# Patient Record
Sex: Female | Born: 1950 | Race: White | Hispanic: No
Health system: Western US, Academic
[De-identification: ages and names within clinical notes are randomized; demographics above are authoritative.]

## PROBLEM LIST (undated history)

## (undated) DIAGNOSIS — M797 Fibromyalgia: Secondary | ICD-10-CM

## (undated) DIAGNOSIS — I10 Essential (primary) hypertension: Secondary | ICD-10-CM

## (undated) DIAGNOSIS — E785 Hyperlipidemia, unspecified: Secondary | ICD-10-CM

## (undated) DIAGNOSIS — K219 Gastro-esophageal reflux disease without esophagitis: Secondary | ICD-10-CM

## (undated) DIAGNOSIS — M810 Age-related osteoporosis without current pathological fracture: Secondary | ICD-10-CM

## (undated) DIAGNOSIS — Z8673 Personal history of transient ischemic attack (TIA), and cerebral infarction without residual deficits: Secondary | ICD-10-CM

## (undated) DIAGNOSIS — I639 Cerebral infarction, unspecified: Secondary | ICD-10-CM

## (undated) DIAGNOSIS — S32050A Wedge compression fracture of fifth lumbar vertebra, initial encounter for closed fracture: Secondary | ICD-10-CM

## (undated) DIAGNOSIS — G4730 Sleep apnea, unspecified: Secondary | ICD-10-CM

## (undated) HISTORY — DX: Cerebral infarction, unspecified: I63.9

## (undated) HISTORY — PX: BACK SURGERY: SHX140

## (undated) HISTORY — DX: Essential (primary) hypertension: I10

## (undated) HISTORY — PX: EYE SURGERY: SHX253

## (undated) HISTORY — PX: BUNIONECTOMY: SHX129

---

## 2007-05-13 DIAGNOSIS — G4730 Sleep apnea, unspecified: Secondary | ICD-10-CM

## 2007-05-13 HISTORY — DX: Sleep apnea, unspecified: G47.30

## 2008-05-12 HISTORY — PX: CATARACT EXTRACTION W/ INTRAOCULAR LENS  IMPLANT, BILATERAL: SHX1307

## 2008-05-12 HISTORY — PX: OTHER SURGICAL HISTORY: SHX169

## 2009-05-04 LAB — HM COLONOSCOPY

## 2010-01-06 ENCOUNTER — Other Ambulatory Visit: Payer: Self-pay

## 2010-03-26 ENCOUNTER — Other Ambulatory Visit: Payer: Self-pay

## 2010-04-09 ENCOUNTER — Ambulatory Visit (HOSPITAL_BASED_OUTPATIENT_CLINIC_OR_DEPARTMENT_OTHER): Payer: No Typology Code available for payment source | Attending: Neurology | Admitting: Neurology

## 2010-04-09 DIAGNOSIS — I6350 Cerebral infarction due to unspecified occlusion or stenosis of unspecified cerebral artery: Secondary | ICD-10-CM | POA: Insufficient documentation

## 2010-04-09 DIAGNOSIS — H9190 Unspecified hearing loss, unspecified ear: Secondary | ICD-10-CM | POA: Insufficient documentation

## 2010-04-09 DIAGNOSIS — I10 Essential (primary) hypertension: Secondary | ICD-10-CM | POA: Insufficient documentation

## 2010-04-16 ENCOUNTER — Ambulatory Visit (HOSPITAL_BASED_OUTPATIENT_CLINIC_OR_DEPARTMENT_OTHER): Payer: No Typology Code available for payment source | Attending: Neurology

## 2010-04-16 DIAGNOSIS — R42 Dizziness and giddiness: Secondary | ICD-10-CM | POA: Insufficient documentation

## 2010-04-16 DIAGNOSIS — I629 Nontraumatic intracranial hemorrhage, unspecified: Secondary | ICD-10-CM | POA: Insufficient documentation

## 2010-05-03 ENCOUNTER — Ambulatory Visit (HOSPITAL_BASED_OUTPATIENT_CLINIC_OR_DEPARTMENT_OTHER): Payer: No Typology Code available for payment source | Attending: Neurology | Admitting: Physical Therapist

## 2010-05-03 DIAGNOSIS — R269 Unspecified abnormalities of gait and mobility: Secondary | ICD-10-CM | POA: Insufficient documentation

## 2010-05-03 DIAGNOSIS — IMO0001 Reserved for inherently not codable concepts without codable children: Secondary | ICD-10-CM | POA: Insufficient documentation

## 2010-05-09 ENCOUNTER — Ambulatory Visit (HOSPITAL_BASED_OUTPATIENT_CLINIC_OR_DEPARTMENT_OTHER): Payer: No Typology Code available for payment source | Admitting: Physical Therapist

## 2010-05-11 ENCOUNTER — Other Ambulatory Visit: Payer: Self-pay

## 2010-05-13 ENCOUNTER — Other Ambulatory Visit: Payer: Self-pay

## 2010-05-14 ENCOUNTER — Other Ambulatory Visit: Payer: Self-pay

## 2010-05-14 ENCOUNTER — Encounter (HOSPITAL_BASED_OUTPATIENT_CLINIC_OR_DEPARTMENT_OTHER): Payer: No Typology Code available for payment source | Admitting: Physical Therapist

## 2010-05-15 ENCOUNTER — Other Ambulatory Visit: Payer: Self-pay

## 2010-05-16 ENCOUNTER — Other Ambulatory Visit: Payer: Self-pay

## 2010-05-17 ENCOUNTER — Other Ambulatory Visit: Payer: Self-pay

## 2010-05-18 ENCOUNTER — Other Ambulatory Visit: Payer: Self-pay

## 2010-05-19 ENCOUNTER — Other Ambulatory Visit: Payer: Self-pay

## 2010-05-20 ENCOUNTER — Other Ambulatory Visit: Payer: Self-pay

## 2010-05-20 ENCOUNTER — Ambulatory Visit (HOSPITAL_BASED_OUTPATIENT_CLINIC_OR_DEPARTMENT_OTHER): Payer: No Typology Code available for payment source | Attending: Neurology | Admitting: Physical Therapist

## 2010-05-20 DIAGNOSIS — IMO0001 Reserved for inherently not codable concepts without codable children: Secondary | ICD-10-CM | POA: Insufficient documentation

## 2010-05-20 DIAGNOSIS — R269 Unspecified abnormalities of gait and mobility: Secondary | ICD-10-CM | POA: Insufficient documentation

## 2010-05-21 ENCOUNTER — Other Ambulatory Visit: Payer: Self-pay

## 2010-05-22 ENCOUNTER — Ambulatory Visit (HOSPITAL_BASED_OUTPATIENT_CLINIC_OR_DEPARTMENT_OTHER): Payer: No Typology Code available for payment source | Admitting: Physical Therapist

## 2010-05-22 ENCOUNTER — Other Ambulatory Visit: Payer: Self-pay

## 2010-05-23 ENCOUNTER — Other Ambulatory Visit: Payer: Self-pay

## 2010-05-24 ENCOUNTER — Other Ambulatory Visit: Payer: Self-pay

## 2010-05-26 ENCOUNTER — Other Ambulatory Visit: Payer: Self-pay

## 2010-05-27 ENCOUNTER — Other Ambulatory Visit: Payer: Self-pay

## 2010-05-29 ENCOUNTER — Encounter (HOSPITAL_BASED_OUTPATIENT_CLINIC_OR_DEPARTMENT_OTHER): Payer: No Typology Code available for payment source | Admitting: Physical Therapist

## 2010-05-29 ENCOUNTER — Other Ambulatory Visit: Payer: Self-pay

## 2010-05-30 ENCOUNTER — Other Ambulatory Visit: Payer: Self-pay

## 2010-05-31 ENCOUNTER — Encounter (HOSPITAL_BASED_OUTPATIENT_CLINIC_OR_DEPARTMENT_OTHER): Payer: No Typology Code available for payment source | Admitting: Physical Therapist

## 2010-05-31 ENCOUNTER — Other Ambulatory Visit: Payer: Self-pay

## 2010-06-01 ENCOUNTER — Other Ambulatory Visit: Payer: Self-pay

## 2010-06-04 ENCOUNTER — Ambulatory Visit (HOSPITAL_BASED_OUTPATIENT_CLINIC_OR_DEPARTMENT_OTHER): Payer: No Typology Code available for payment source | Admitting: Physical Therapist

## 2010-06-06 ENCOUNTER — Ambulatory Visit (HOSPITAL_BASED_OUTPATIENT_CLINIC_OR_DEPARTMENT_OTHER): Payer: No Typology Code available for payment source | Admitting: Physical Therapist

## 2010-06-11 ENCOUNTER — Ambulatory Visit (HOSPITAL_BASED_OUTPATIENT_CLINIC_OR_DEPARTMENT_OTHER): Payer: No Typology Code available for payment source | Admitting: Physical Therapist

## 2010-06-13 ENCOUNTER — Ambulatory Visit (HOSPITAL_BASED_OUTPATIENT_CLINIC_OR_DEPARTMENT_OTHER): Payer: No Typology Code available for payment source | Attending: Neurology | Admitting: Physical Therapist

## 2010-06-13 DIAGNOSIS — R269 Unspecified abnormalities of gait and mobility: Secondary | ICD-10-CM | POA: Insufficient documentation

## 2010-06-13 DIAGNOSIS — IMO0001 Reserved for inherently not codable concepts without codable children: Secondary | ICD-10-CM | POA: Insufficient documentation

## 2010-06-17 ENCOUNTER — Ambulatory Visit (HOSPITAL_BASED_OUTPATIENT_CLINIC_OR_DEPARTMENT_OTHER): Payer: No Typology Code available for payment source | Admitting: Physical Therapist

## 2010-06-19 ENCOUNTER — Encounter (HOSPITAL_BASED_OUTPATIENT_CLINIC_OR_DEPARTMENT_OTHER): Payer: No Typology Code available for payment source | Admitting: Physical Therapist

## 2010-06-20 ENCOUNTER — Encounter (HOSPITAL_BASED_OUTPATIENT_CLINIC_OR_DEPARTMENT_OTHER): Payer: No Typology Code available for payment source | Admitting: Physical Therapist

## 2010-06-24 ENCOUNTER — Ambulatory Visit (HOSPITAL_BASED_OUTPATIENT_CLINIC_OR_DEPARTMENT_OTHER): Payer: No Typology Code available for payment source | Admitting: Physical Therapist

## 2010-06-25 ENCOUNTER — Ambulatory Visit: Payer: No Typology Code available for payment source | Attending: Neurology | Admitting: Neurology

## 2010-06-25 DIAGNOSIS — I6350 Cerebral infarction due to unspecified occlusion or stenosis of unspecified cerebral artery: Secondary | ICD-10-CM | POA: Insufficient documentation

## 2010-06-26 ENCOUNTER — Ambulatory Visit (HOSPITAL_BASED_OUTPATIENT_CLINIC_OR_DEPARTMENT_OTHER): Payer: No Typology Code available for payment source | Admitting: Physical Therapist

## 2010-07-02 ENCOUNTER — Ambulatory Visit (HOSPITAL_BASED_OUTPATIENT_CLINIC_OR_DEPARTMENT_OTHER): Payer: No Typology Code available for payment source | Admitting: Physical Therapist

## 2010-07-02 ENCOUNTER — Ambulatory Visit (HOSPITAL_BASED_OUTPATIENT_CLINIC_OR_DEPARTMENT_OTHER): Payer: No Typology Code available for payment source | Attending: Neurology | Admitting: Neurology

## 2010-07-02 DIAGNOSIS — I10 Essential (primary) hypertension: Secondary | ICD-10-CM | POA: Insufficient documentation

## 2010-07-02 DIAGNOSIS — I619 Nontraumatic intracerebral hemorrhage, unspecified: Secondary | ICD-10-CM | POA: Insufficient documentation

## 2010-07-04 ENCOUNTER — Ambulatory Visit (HOSPITAL_BASED_OUTPATIENT_CLINIC_OR_DEPARTMENT_OTHER): Payer: No Typology Code available for payment source | Attending: Neurology | Admitting: Physical Therapist

## 2010-07-04 DIAGNOSIS — IMO0001 Reserved for inherently not codable concepts without codable children: Secondary | ICD-10-CM | POA: Insufficient documentation

## 2010-07-04 DIAGNOSIS — R269 Unspecified abnormalities of gait and mobility: Secondary | ICD-10-CM | POA: Insufficient documentation

## 2010-07-09 ENCOUNTER — Ambulatory Visit (HOSPITAL_BASED_OUTPATIENT_CLINIC_OR_DEPARTMENT_OTHER): Payer: No Typology Code available for payment source | Admitting: Physical Therapist

## 2010-07-09 ENCOUNTER — Encounter (HOSPITAL_BASED_OUTPATIENT_CLINIC_OR_DEPARTMENT_OTHER): Payer: No Typology Code available for payment source | Admitting: Neurology

## 2010-07-11 ENCOUNTER — Encounter (HOSPITAL_BASED_OUTPATIENT_CLINIC_OR_DEPARTMENT_OTHER): Payer: No Typology Code available for payment source | Admitting: Physical Therapist

## 2010-07-16 ENCOUNTER — Ambulatory Visit (HOSPITAL_BASED_OUTPATIENT_CLINIC_OR_DEPARTMENT_OTHER): Payer: No Typology Code available for payment source | Attending: Neurology | Admitting: Physical Therapist

## 2010-07-16 DIAGNOSIS — IMO0001 Reserved for inherently not codable concepts without codable children: Secondary | ICD-10-CM | POA: Insufficient documentation

## 2010-07-16 DIAGNOSIS — R269 Unspecified abnormalities of gait and mobility: Secondary | ICD-10-CM | POA: Insufficient documentation

## 2010-07-25 ENCOUNTER — Ambulatory Visit (HOSPITAL_BASED_OUTPATIENT_CLINIC_OR_DEPARTMENT_OTHER): Payer: No Typology Code available for payment source | Admitting: Physical Therapist

## 2010-07-29 ENCOUNTER — Encounter (HOSPITAL_BASED_OUTPATIENT_CLINIC_OR_DEPARTMENT_OTHER): Payer: No Typology Code available for payment source | Admitting: Physical Therapist

## 2010-07-30 ENCOUNTER — Encounter (HOSPITAL_BASED_OUTPATIENT_CLINIC_OR_DEPARTMENT_OTHER): Payer: No Typology Code available for payment source | Admitting: Neurology

## 2010-07-31 ENCOUNTER — Ambulatory Visit (HOSPITAL_BASED_OUTPATIENT_CLINIC_OR_DEPARTMENT_OTHER): Payer: No Typology Code available for payment source | Attending: Neurology | Admitting: Physical Therapist

## 2010-07-31 DIAGNOSIS — R269 Unspecified abnormalities of gait and mobility: Secondary | ICD-10-CM | POA: Insufficient documentation

## 2010-07-31 DIAGNOSIS — IMO0001 Reserved for inherently not codable concepts without codable children: Secondary | ICD-10-CM | POA: Insufficient documentation

## 2010-08-05 ENCOUNTER — Encounter (HOSPITAL_BASED_OUTPATIENT_CLINIC_OR_DEPARTMENT_OTHER): Payer: No Typology Code available for payment source | Admitting: Physical Therapist

## 2010-08-07 ENCOUNTER — Ambulatory Visit (HOSPITAL_BASED_OUTPATIENT_CLINIC_OR_DEPARTMENT_OTHER): Payer: No Typology Code available for payment source | Admitting: Physical Therapist

## 2010-08-13 ENCOUNTER — Ambulatory Visit (HOSPITAL_BASED_OUTPATIENT_CLINIC_OR_DEPARTMENT_OTHER): Payer: No Typology Code available for payment source | Attending: Neurology | Admitting: Physical Therapist

## 2010-08-13 DIAGNOSIS — IMO0001 Reserved for inherently not codable concepts without codable children: Secondary | ICD-10-CM | POA: Insufficient documentation

## 2010-08-13 DIAGNOSIS — R269 Unspecified abnormalities of gait and mobility: Secondary | ICD-10-CM | POA: Insufficient documentation

## 2010-08-20 ENCOUNTER — Ambulatory Visit (HOSPITAL_BASED_OUTPATIENT_CLINIC_OR_DEPARTMENT_OTHER): Payer: No Typology Code available for payment source | Admitting: Physical Therapist

## 2010-09-03 ENCOUNTER — Ambulatory Visit (HOSPITAL_BASED_OUTPATIENT_CLINIC_OR_DEPARTMENT_OTHER): Payer: No Typology Code available for payment source | Admitting: Physical Therapist

## 2010-09-10 ENCOUNTER — Ambulatory Visit (HOSPITAL_BASED_OUTPATIENT_CLINIC_OR_DEPARTMENT_OTHER): Payer: No Typology Code available for payment source | Attending: Neurology | Admitting: Physical Therapist

## 2010-09-10 DIAGNOSIS — R269 Unspecified abnormalities of gait and mobility: Secondary | ICD-10-CM | POA: Insufficient documentation

## 2010-09-10 DIAGNOSIS — IMO0001 Reserved for inherently not codable concepts without codable children: Secondary | ICD-10-CM | POA: Insufficient documentation

## 2010-09-17 ENCOUNTER — Ambulatory Visit (HOSPITAL_BASED_OUTPATIENT_CLINIC_OR_DEPARTMENT_OTHER): Payer: No Typology Code available for payment source | Admitting: Physical Therapist

## 2010-09-26 ENCOUNTER — Ambulatory Visit (HOSPITAL_BASED_OUTPATIENT_CLINIC_OR_DEPARTMENT_OTHER): Payer: No Typology Code available for payment source | Admitting: Physical Therapist

## 2010-10-01 ENCOUNTER — Ambulatory Visit (HOSPITAL_BASED_OUTPATIENT_CLINIC_OR_DEPARTMENT_OTHER): Payer: No Typology Code available for payment source | Admitting: Physical Therapist

## 2010-10-08 ENCOUNTER — Ambulatory Visit (HOSPITAL_BASED_OUTPATIENT_CLINIC_OR_DEPARTMENT_OTHER): Payer: No Typology Code available for payment source | Admitting: Physical Therapist

## 2011-07-22 ENCOUNTER — Ambulatory Visit (HOSPITAL_BASED_OUTPATIENT_CLINIC_OR_DEPARTMENT_OTHER): Payer: No Typology Code available for payment source | Attending: Neurology | Admitting: Neurology

## 2011-07-22 DIAGNOSIS — I999 Unspecified disorder of circulatory system: Secondary | ICD-10-CM | POA: Insufficient documentation

## 2011-07-22 DIAGNOSIS — I10 Essential (primary) hypertension: Secondary | ICD-10-CM | POA: Insufficient documentation

## 2011-07-22 DIAGNOSIS — R269 Unspecified abnormalities of gait and mobility: Secondary | ICD-10-CM | POA: Insufficient documentation

## 2011-07-22 DIAGNOSIS — Z7982 Long term (current) use of aspirin: Secondary | ICD-10-CM | POA: Insufficient documentation

## 2011-10-13 LAB — HM PAP SMEAR: HM Pap smear: NORMAL

## 2011-12-14 LAB — HM MAMMOGRAPHY: HM Mammogram: NORMAL

## 2012-05-03 ENCOUNTER — Emergency Department: Payer: Self-pay | Admitting: Emergency Medicine

## 2012-05-03 LAB — BASIC METABOLIC PANEL
Anion Gap: 9 (ref 7–16)
BUN: 20 mg/dL — ABNORMAL HIGH (ref 7–18)
Chloride: 104 mmol/L (ref 98–107)
Co2: 27 mmol/L (ref 21–32)
Creatinine: 1.06 mg/dL (ref 0.60–1.30)
EGFR (African American): >60
Osmolality: 282 (ref 275–301)
Potassium: 3.6 mmol/L (ref 3.5–5.1)

## 2012-05-03 LAB — CBC
MCHC: 32.4 g/dL (ref 32.0–36.0)
Platelet: 201 10*3/uL (ref 150–440)
RDW: 13.4 % (ref 11.5–14.5)

## 2012-05-04 ENCOUNTER — Encounter: Payer: Self-pay | Admitting: Internal Medicine

## 2012-06-04 ENCOUNTER — Ambulatory Visit (INDEPENDENT_AMBULATORY_CARE_PROVIDER_SITE_OTHER): Payer: BC Managed Care – PPO | Admitting: Internal Medicine

## 2012-06-04 ENCOUNTER — Encounter: Payer: Self-pay | Admitting: Internal Medicine

## 2012-06-04 VITALS — BP 118/70 | HR 68 | Temp 98.2°F | Resp 16 | Ht 63.5 in | Wt 182.0 lb

## 2012-06-04 DIAGNOSIS — I679 Cerebrovascular disease, unspecified: Secondary | ICD-10-CM

## 2012-06-04 DIAGNOSIS — E785 Hyperlipidemia, unspecified: Secondary | ICD-10-CM

## 2012-06-04 DIAGNOSIS — G4733 Obstructive sleep apnea (adult) (pediatric): Secondary | ICD-10-CM

## 2012-06-04 DIAGNOSIS — I10 Essential (primary) hypertension: Secondary | ICD-10-CM

## 2012-06-04 DIAGNOSIS — M810 Age-related osteoporosis without current pathological fracture: Secondary | ICD-10-CM | POA: Insufficient documentation

## 2012-06-04 NOTE — Patient Instructions (Addendum)
Return for fasting labs at your leisure, (please drink water but nothing else except black coffee ok)    Sign up for MyChart

## 2012-06-04 NOTE — Progress Notes (Signed)
Patient ID: Ellen Hamilton, female   DOB: [birth year], 62 y.o.   MRN: [id number]   Patient Active Problem List  Diagnosis  . Cerebrovascular disease  . Hypertension  . Hyperlipidemia  . Osteoporosis  . OSA on CPAP    Subjective:  CC:   Chief Complaint  Patient presents with  . Establish Care    HPI:   Ellen Hamilton is a 62 y.o. female who presents as a new patient to establish primary care with the chief complaint of need for primary care.  Her lat residence was [place], lived for  12 yrs there.  husband retired from [employer].  Living at  [facility] since August 2013.  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  On 3 medications Secondary causes evaluated wit sleep study which oconfirmed OSA in 2009, now using  CPAP. She is sleeping well with CPAP.   Has been graphing BPs and events since 2009.   Osteoporosis.   Right sided hairline (x2) Rib fractures during fall 05/03/12.  History of osteoporosis diagnosed in 1996 with T8 vertebral fracture.  2nd vertebral fracture 2010  T12 during PT . Initially treated with fosomax for 10 years, then started on calcitonin, 2006 or 2007.  Last DEXA Mar 20 2011 .  Patient has copies of all imaging.   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 Foot drops with fatigue,  Trouble initiating walking. Using a cane. 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      Past Medical History  Diagnosis Date  . Stroke     pt has had two  . Hypertension     History reviewed. No pertinent past surgical history.  Family History  Problem Relation Age of Onset  . Mental illness Mother     alzheimers  . Heart disease Father   . COPD Father   . Cancer Father     possible colon CA  . Mental illness Maternal Aunt     alzheimers  . Multiple sclerosis Cousin      History   Social History  . Marital Status: Married    Spouse Name: N/A    Number of Children: N/A  . Years of Education: N/A   Occupational History  . Not on file.   Social History Main Topics  . Smoking status: Never Smoker   . Smokeless tobacco: Not on file  . Alcohol Use: Yes  . Drug Use: No  . Sexually Active: Not on file   Other Topics Concern  . Not on file   Social History Narrative  . No narrative on file   Allergies  Allergen Reactions  . Hydrocodone Other (See Comments)    BP drops drastically  . Oxycodone Other (See Comments)    hallucinations  . Penicillins Rash     Review of Systems:   Patient denies headache, fevers, malaise, unintentional weight loss, skin rash, eye pain, sinus congestion and sinus pain, sore throat, dysphagia,  hemoptysis , cough, dyspnea, wheezing, chest pain, palpitations, orthopnea, edema, abdominal pain, nausea, melena, diarrhea, constipation, flank pain, dysuria, hematuria, urinary  Frequency, nocturia, numbness, tingling, seizures,  Focal weakness, Loss of consciousness,  Tremor, insomnia, depression, anxiety, and suicidal ideation.     Objective:  BP 118/70  Pulse 68  Temp 98.2 F (36.8 C) (Oral)  Resp 16  Ht 5' 3.5" (1.613 m)  Wt 182 lb (82.555 kg)  BMI 31.73 kg/m2  SpO2 97%  General appearance: alert, cooperative and appears stated age Ears: normal TM's and external ear canals both ears Throat: lips, mucosa, and tongue normal; teeth and gums normal Neck: no adenopathy, no carotid bruit, supple, symmetrical, trachea midline and thyroid not enlarged, symmetric, no tenderness/mass/nodules Back: symmetric, no curvature. ROM normal. No CVA tenderness. Lungs: clear to auscultation bilaterally Heart: regular rate and rhythm, S1, S2 normal, no murmur, click, rub or gallop Abdomen: soft, non-tender; bowel sounds normal; no masses,  no organomegaly Pulses: 2+ and symmetric Skin: Skin color, texture, turgor normal. No  rashes or lesions Lymph nodes: Cervical, supraclavicular, and axillary nodes normal.  Assessment and Plan:  Cerebrovascular disease With rigid residual right leg weakness secondary prior CVAs x2. Using a cane. Continue current antiplatelet agents. Continue strict control of Hypertension, sleep apnea , Return for fasting lipids.  Hypertension Well controlled on current regimen. Renal function historically stable, no changes today.  OSA on CPAP Managed with nightly use of CPAP. She is averaging 6-8 hours of use per night.  Osteoporosis Patient currently on calcitonin secondary to history of osteoporosis treatment with Fosamax x10 years . Records requested.   Updated Medication List Outpatient Encounter Prescriptions as of 06/04/2012  Medication Sig Dispense Refill  . amLODipine (NORVASC) 10 MG tablet Take 10 mg by mouth daily.       Marland Kitchen aspirin 81 MG tablet Take 81 mg by mouth daily.      . calcitonin, salmon, (MIACALCIN/FORTICAL) 200 UNIT/ACT nasal spray Place 2 sprays into the nose daily.       Marland Kitchen CALCIUM CITRATE PO Take 1,500 mg by mouth 3 (three) times daily.      . cholecalciferol (VITAMIN D) 1000 UNITS tablet Take 1,000 Units by mouth daily.      . CRESTOR 40 MG tablet Take 40 mg by mouth daily.       Marland Kitchen dipyridamole (PERSANTINE) 50 MG tablet Take 200 mg by mouth 2 (two) times daily.       . fish oil-omega-3 fatty acids 1000 MG capsule Take 1,000 mg by mouth daily.      . metroNIDAZOLE (METROCREAM) 0.75 % cream Apply 1 application topically 2 (two) times daily.       Marland Kitchen MICARDIS HCT 80-12.5 MG per tablet Take 1 tablet by mouth daily.       . Multiple Vitamin (MULTIVITAMIN) tablet Take 1 tablet by mouth daily.      Marland Kitchen omeprazole (PRILOSEC) 40 MG capsule Take 40 mg by mouth daily.       . TOPROL XL 100 MG 24 hr tablet Take 100 mg by mouth daily.       . vitamin B-12 (CYANOCOBALAMIN) 1000 MCG tablet Take 1,000 mcg by mouth every other day.         Orders Placed This Encounter    Procedures  . HM MAMMOGRAPHY  . HM PAP SMEAR  . HM COLONOSCOPY    No Follow-up on file.

## 2012-06-05 ENCOUNTER — Encounter: Payer: Self-pay | Admitting: Internal Medicine

## 2012-06-06 DIAGNOSIS — G4733 Obstructive sleep apnea (adult) (pediatric): Secondary | ICD-10-CM | POA: Insufficient documentation

## 2012-06-06 NOTE — Assessment & Plan Note (Addendum)
With rigid residual right leg weakness secondary prior CVAs x2. Using a cane. Continue current antiplatelet agents. Continue strict control of Hypertension, sleep apnea , Return for fasting lipids.

## 2012-06-06 NOTE — Assessment & Plan Note (Signed)
Managed with nightly use of CPAP. She is averaging 6-8 hours of use per night.

## 2012-06-06 NOTE — Assessment & Plan Note (Signed)
Well controlled on current regimen. Renal function historically stable, no changes today.

## 2012-06-06 NOTE — Assessment & Plan Note (Signed)
Patient currently on calcitonin secondary to history of osteoporosis treatment with Fosamax x10 years . Records requested.

## 2012-06-09 ENCOUNTER — Telehealth: Payer: Self-pay | Admitting: *Deleted

## 2012-06-09 DIAGNOSIS — E785 Hyperlipidemia, unspecified: Secondary | ICD-10-CM

## 2012-06-09 DIAGNOSIS — E559 Vitamin D deficiency, unspecified: Secondary | ICD-10-CM

## 2012-06-09 DIAGNOSIS — R5383 Other fatigue: Secondary | ICD-10-CM

## 2012-06-09 NOTE — Addendum Note (Signed)
Addended by: Sherlene Shams on: 06/09/2012 04:13 PM   Modules accepted: Orders

## 2012-06-09 NOTE — Telephone Encounter (Signed)
Pt is coming in for labs tomorrow (1.30.2014) what labs and dx would you like? Thank you

## 2012-06-09 NOTE — Telephone Encounter (Signed)
Labs ordered, thanks

## 2012-06-10 ENCOUNTER — Other Ambulatory Visit (INDEPENDENT_AMBULATORY_CARE_PROVIDER_SITE_OTHER): Payer: BC Managed Care – PPO

## 2012-06-10 ENCOUNTER — Encounter: Payer: Self-pay | Admitting: Internal Medicine

## 2012-06-10 ENCOUNTER — Other Ambulatory Visit: Payer: BC Managed Care – PPO

## 2012-06-10 DIAGNOSIS — E785 Hyperlipidemia, unspecified: Secondary | ICD-10-CM

## 2012-06-10 DIAGNOSIS — R5383 Other fatigue: Secondary | ICD-10-CM

## 2012-06-10 DIAGNOSIS — R5381 Other malaise: Secondary | ICD-10-CM

## 2012-06-10 DIAGNOSIS — E559 Vitamin D deficiency, unspecified: Secondary | ICD-10-CM

## 2012-06-10 LAB — LIPID PANEL
HDL: 41 mg/dL (ref >39.00)
LDL Cholesterol: 60 mg/dL (ref 0–99)
Total CHOL/HDL Ratio: 3
VLDL: 22.4 mg/dL (ref 0.0–40.0)

## 2012-06-10 LAB — COMPREHENSIVE METABOLIC PANEL
ALT: 13 U/L (ref 0–35)
AST: 24 U/L (ref 0–37)
Alkaline Phosphatase: 60 U/L (ref 39–117)
Calcium: 9.6 mg/dL (ref 8.4–10.5)
Chloride: 100 mEq/L (ref 96–112)
Creatinine, Ser: 0.7 mg/dL (ref 0.4–1.2)
Potassium: 4 mEq/L (ref 3.5–5.1)

## 2012-06-10 LAB — TSH: TSH: 2.71 u[IU]/mL (ref 0.35–5.50)

## 2012-06-11 ENCOUNTER — Encounter: Payer: Self-pay | Admitting: Internal Medicine

## 2012-06-11 ENCOUNTER — Other Ambulatory Visit: Payer: BC Managed Care – PPO

## 2012-06-13 ENCOUNTER — Encounter: Payer: Self-pay | Admitting: Internal Medicine

## 2012-06-13 DIAGNOSIS — Z8639 Personal history of other endocrine, nutritional and metabolic disease: Secondary | ICD-10-CM

## 2012-06-13 DIAGNOSIS — K635 Polyp of colon: Secondary | ICD-10-CM | POA: Insufficient documentation

## 2012-06-14 ENCOUNTER — Other Ambulatory Visit: Payer: Self-pay | Admitting: General Practice

## 2012-06-14 MED ORDER — ROSUVASTATIN CALCIUM 40 MG PO TABS: 40.0000 mg | ORAL_TABLET | Freq: Every day | ORAL | Status: DC

## 2012-06-14 MED ORDER — TELMISARTAN-HCTZ 80-12.5 MG PO TABS: 1.0000 | ORAL_TABLET | Freq: Every day | ORAL | Status: DC

## 2012-06-14 MED ORDER — DIPYRIDAMOLE 50 MG PO TABS: 200.0000 mg | ORAL_TABLET | Freq: Two times a day (BID) | ORAL | Status: DC

## 2012-06-14 MED ORDER — METOPROLOL SUCCINATE ER 100 MG PO TB24: 100.0000 mg | ORAL_TABLET | Freq: Every day | ORAL | Status: DC

## 2012-06-14 MED ORDER — AMLODIPINE BESYLATE 10 MG PO TABS: 10.0000 mg | ORAL_TABLET | Freq: Every day | ORAL | Status: DC

## 2012-06-14 MED ORDER — CALCITONIN (SALMON) 200 UNIT/ACT NA SOLN: 2.0000 | Freq: Every day | NASAL | Status: DC

## 2012-06-14 NOTE — Telephone Encounter (Signed)
Meds filled

## 2012-06-21 ENCOUNTER — Other Ambulatory Visit: Payer: Self-pay | Admitting: General Practice

## 2012-06-21 MED ORDER — CALCITONIN (SALMON) 200 UNIT/ACT NA SOLN: 1.0000 | Freq: Every day | NASAL | Status: DC

## 2012-07-07 ENCOUNTER — Encounter: Payer: Self-pay | Admitting: Internal Medicine

## 2012-07-08 ENCOUNTER — Encounter: Payer: Self-pay | Admitting: Internal Medicine

## 2012-07-08 DIAGNOSIS — S2241XA Multiple fractures of ribs, right side, initial encounter for closed fracture: Secondary | ICD-10-CM

## 2012-07-08 NOTE — Telephone Encounter (Signed)
Ellen Hamilton , please patient know that she does not need appt.for rib films.  She will need to come by to pick up the signed order.

## 2012-07-27 ENCOUNTER — Encounter: Payer: Self-pay | Admitting: Internal Medicine

## 2012-07-27 ENCOUNTER — Ambulatory Visit (INDEPENDENT_AMBULATORY_CARE_PROVIDER_SITE_OTHER): Payer: BC Managed Care – PPO | Admitting: Internal Medicine

## 2012-07-27 ENCOUNTER — Telehealth: Payer: Self-pay | Admitting: General Practice

## 2012-07-27 ENCOUNTER — Encounter (HOSPITAL_BASED_OUTPATIENT_CLINIC_OR_DEPARTMENT_OTHER): Payer: No Typology Code available for payment source | Admitting: Neurology

## 2012-07-27 VITALS — BP 113/74 | HR 51 | Temp 98.0°F

## 2012-07-27 DIAGNOSIS — M25559 Pain in unspecified hip: Secondary | ICD-10-CM

## 2012-07-27 DIAGNOSIS — R1031 Right lower quadrant pain: Secondary | ICD-10-CM

## 2012-07-27 DIAGNOSIS — M25551 Pain in right hip: Secondary | ICD-10-CM

## 2012-07-27 NOTE — Patient Instructions (Addendum)
Ok to add 500 mg tylenol 3 times daily with the ibuprofen for pain control.    X rays of right hip and  Pelvis  to rule out fracture

## 2012-07-27 NOTE — Telephone Encounter (Signed)
Pt came into the office today stating that she has had pain and inflammation in her Right hip into the buttocks do to a fall a week ago. Pt advises that on the same leg yesterday she was standing doing laundry and she turned suddenly and has been having pain radiating into the inguinal groin area. Offered Pt an appt they wanted your opinion first.

## 2012-07-27 NOTE — Telephone Encounter (Signed)
i have time on ny schedule to see her today,  bvut I will not order x rays on a patient I have not seen,.

## 2012-07-27 NOTE — Progress Notes (Addendum)
Patient ID: Ellen Hamilton, female   DOB: May 27, 1950, 62 y.o.   MRN: [id number]  Patient Active Problem List  Diagnosis  . Cerebrovascular disease  . Hypertension  . Hyperlipidemia  . Osteoporosis  . OSA on CPAP  . History of thyroid nodule  . Benign colonic polyp  . Right hip pain  . Rt inguinal pain    Subjective:  CC:   Chief Complaint  Patient presents with  . Patient states she is here for a pulled muscle in her groin     HPI:   Ellen Hamilton a 62 y.o. female who presents Hip and pelvic pain. Patient has a history of osteoporosis with multiple prior fractures including ribs in the last year. She reports persistent right hip pain which occurred as a result of a fall. She fell onto her bottom while walking in the kitchen floor with her cane. The fall occurred at occurred while turning suddenly. She developed a very large hematoma on her right hip which she determine was larger than usual because of her use of Aggrenox. Fall occurred on March 6. Then about a week ago she made a quick turn and developed sharp pain in the right inguinal area. The pain has been persistent but has improved with use of a heating pad and is currently at 3 or 4/10. She's also been taking her pronator milligrams 3 times daily for a week. She's having no bowel or bladder incontinence. She is able to ambulate and participate in water activities but has trouble going up and down stairs.    Past Medical History  Diagnosis Date  . Stroke     pt has had two  . Hypertension     No past surgical history on file.   The following portions of the patient's history were reviewed and updated as appropriate: Allergies, current medications, and problem list.   Review of Systems:   Patient denies headache, fevers, malaise, unintentional weight loss, skin rash, eye pain, sinus congestion and sinus pain, sore throat, dysphagia,  hemoptysis , cough, dyspnea, wheezing, chest pain, palpitations, orthopnea,  edema, abdominal pain, nausea, melena, diarrhea, constipation, flank pain, dysuria, hematuria, urinary  Frequency, nocturia, numbness, tingling, seizures,  Focal weakness, Loss of consciousness,  Tremor, insomnia, depression, anxiety, and suicidal ideation.      History   Social History  . Marital Status: Married    Spouse Name: N/A    Number of Children: N/A  . Years of Education: N/A   Occupational History  . Not on file.   Social History Main Topics  . Smoking status: Never Smoker   . Smokeless tobacco: Not on file  . Alcohol Use: Yes  . Drug Use: No  . Sexually Active: Not on file   Other Topics Concern  . Not on file   Social History Narrative  . No narrative on file    Objective:  BP 113/74  Pulse 51  Temp(Src) 98 F (36.7 C)  SpO2 96%  General appearance: alert, cooperative and appears stated age Ears: normal TM's and external ear canals both ears Throat: lips, mucosa, and tongue normal; teeth and gums normal Neck: no adenopathy, no carotid bruit, supple, symmetrical, trachea midline and thyroid not enlarged, symmetric, no tenderness/mass/nodules Back: symmetric, no curvature. ROM normal. No CVA tenderness. Lungs: clear to auscultation bilaterally Heart: regular rate and rhythm, S1, S2 normal, no murmur, click, rub or gallop Abdomen: soft, non-tender; bowel sounds normal; no masses,  no organomegaly Pulses: 2+ and symmetric Skin: Skin  color, texture, turgor normal. No rashes or lesions MSK:: Decreased ROM right hip. Large hematoma covering lateral hip. Gait: Antalgic   Assessment and Plan:  Right hip pain On exam she has a very large hematoma on the right hip with decreased range of motion particularly with abduction plain x-rays to be ordered. If it is negative for fracture she can continue her physical therapy at [facility].  Rt inguinal pain Given her osteoporosis and recent fall I would like to rule out a pubiv ramus fracture or other pelvic  fracture before attributing this a pulled muscle as the patient has done . I have ordered plain films of the pelvis and right hip.   Updated Medication List Outpatient Encounter Prescriptions as of 07/27/2012  Medication Sig Dispense Refill  . amLODipine (NORVASC) 10 MG tablet Take 1 tablet (10 mg total) by mouth daily.  90 tablet  1  . aspirin 81 MG tablet Take 81 mg by mouth daily.      . calcitonin, salmon, (MIACALCIN/FORTICAL) 200 UNIT/ACT nasal spray Place 1 spray into the nose daily. Alternate nostril every other day.  11.1 mL  0  . CALCIUM CITRATE PO Take 1,500 mg by mouth 3 (three) times daily.      . cholecalciferol (VITAMIN D) 1000 UNITS tablet Take 1,000 Units by mouth daily.      Marland Kitchen dipyridamole (PERSANTINE) 50 MG tablet Take 4 tablets (200 mg total) by mouth 2 (two) times daily.  270 tablet  1  . fish oil-omega-3 fatty acids 1000 MG capsule Take 1,000 mg by mouth daily.      . metoprolol succinate (TOPROL XL) 100 MG 24 hr tablet Take 1 tablet (100 mg total) by mouth daily.  90 tablet  1  . metroNIDAZOLE (METROCREAM) 0.75 % cream Apply 1 application topically 2 (two) times daily.       . Multiple Vitamin (MULTIVITAMIN) tablet Take 1 tablet by mouth daily.      Marland Kitchen omeprazole (PRILOSEC) 40 MG capsule Take 40 mg by mouth daily.       . rosuvastatin (CRESTOR) 40 MG tablet Take 1 tablet (40 mg total) by mouth daily.  90 tablet  1  . telmisartan-hydrochlorothiazide (MICARDIS HCT) 80-12.5 MG per tablet Take 1 tablet by mouth daily.  90 tablet  1  . vitamin B-12 (CYANOCOBALAMIN) 1000 MCG tablet Take 1,000 mcg by mouth every other day.       No facility-administered encounter medications on file as of 07/27/2012.     Orders Placed This Encounter  Procedures  . DG Hip Complete Right  . DG Pelvis 1-2 Views    No Follow-up on file.

## 2012-07-27 NOTE — Telephone Encounter (Signed)
Patient added to schedule today.  

## 2012-07-28 DIAGNOSIS — R1031 Right lower quadrant pain: Secondary | ICD-10-CM | POA: Insufficient documentation

## 2012-07-28 DIAGNOSIS — M25551 Pain in right hip: Secondary | ICD-10-CM | POA: Insufficient documentation

## 2012-07-28 NOTE — Assessment & Plan Note (Signed)
Given her osteoporosis and recent fall at like to rule out a ramus fracture or other pelvic fracture before attributing this to pulled muscle the patient is to. I have ordered plain films of the pelvis and right hip.

## 2012-07-28 NOTE — Assessment & Plan Note (Signed)
On exam she has a very large hematoma on the right hip with decreased range of motion particularly with abduction plain x-rays to be ordered. If it is negative for fracture she can continue her physical therapy at [facility].

## 2012-07-29 ENCOUNTER — Ambulatory Visit (INDEPENDENT_AMBULATORY_CARE_PROVIDER_SITE_OTHER)
Admission: RE | Admit: 2012-07-29 | Discharge: 2012-07-29 | Disposition: A | Discharge status: Home / Self Care | Payer: BC Managed Care – PPO | Source: Ambulatory Visit | Attending: Internal Medicine | Admitting: Internal Medicine

## 2012-07-29 ENCOUNTER — Telehealth: Payer: Self-pay | Admitting: General Practice

## 2012-07-29 ENCOUNTER — Ambulatory Visit
Admission: RE | Admit: 2012-07-29 | Discharge: 2012-07-29 | Disposition: A | Discharge status: Home / Self Care | Payer: Self-pay | Source: Ambulatory Visit | Attending: Internal Medicine | Admitting: Internal Medicine

## 2012-07-29 DIAGNOSIS — S2241XA Multiple fractures of ribs, right side, initial encounter for closed fracture: Secondary | ICD-10-CM

## 2012-07-29 DIAGNOSIS — S2249XA Multiple fractures of ribs, unspecified side, initial encounter for closed fracture: Secondary | ICD-10-CM

## 2012-07-29 DIAGNOSIS — M25559 Pain in unspecified hip: Secondary | ICD-10-CM

## 2012-07-29 DIAGNOSIS — M25551 Pain in right hip: Secondary | ICD-10-CM

## 2012-07-30 ENCOUNTER — Telehealth: Payer: Self-pay | Admitting: Internal Medicine

## 2012-07-30 ENCOUNTER — Encounter: Payer: Self-pay | Admitting: Internal Medicine

## 2012-07-30 DIAGNOSIS — R2689 Other abnormalities of gait and mobility: Secondary | ICD-10-CM

## 2012-08-02 NOTE — Telephone Encounter (Signed)
Error

## 2012-08-04 ENCOUNTER — Encounter: Payer: Self-pay | Admitting: Internal Medicine

## 2012-08-04 DIAGNOSIS — M810 Age-related osteoporosis without current pathological fracture: Secondary | ICD-10-CM

## 2012-08-04 DIAGNOSIS — I679 Cerebrovascular disease, unspecified: Secondary | ICD-10-CM

## 2012-08-17 ENCOUNTER — Encounter: Payer: Self-pay | Admitting: Internal Medicine

## 2012-08-25 ENCOUNTER — Encounter: Payer: Self-pay | Admitting: Internal Medicine

## 2012-08-25 ENCOUNTER — Telehealth: Payer: Self-pay | Admitting: Internal Medicine

## 2012-08-25 DIAGNOSIS — Z79899 Other long term (current) drug therapy: Secondary | ICD-10-CM

## 2012-08-25 DIAGNOSIS — E559 Vitamin D deficiency, unspecified: Secondary | ICD-10-CM

## 2012-08-25 DIAGNOSIS — E785 Hyperlipidemia, unspecified: Secondary | ICD-10-CM

## 2012-08-25 DIAGNOSIS — R5381 Other malaise: Secondary | ICD-10-CM

## 2012-08-25 NOTE — Telephone Encounter (Signed)
Patient would like labs before visit 5/20

## 2012-08-25 NOTE — Telephone Encounter (Signed)
Patient is wanting labs done before her physical on 5.20.14.

## 2012-09-09 ENCOUNTER — Encounter: Payer: Self-pay | Admitting: Internal Medicine

## 2012-09-22 ENCOUNTER — Other Ambulatory Visit (INDEPENDENT_AMBULATORY_CARE_PROVIDER_SITE_OTHER): Payer: BC Managed Care – PPO

## 2012-09-22 DIAGNOSIS — R5383 Other fatigue: Secondary | ICD-10-CM

## 2012-09-22 DIAGNOSIS — E559 Vitamin D deficiency, unspecified: Secondary | ICD-10-CM

## 2012-09-22 DIAGNOSIS — E785 Hyperlipidemia, unspecified: Secondary | ICD-10-CM

## 2012-09-22 DIAGNOSIS — R5381 Other malaise: Secondary | ICD-10-CM

## 2012-09-22 DIAGNOSIS — Z79899 Other long term (current) drug therapy: Secondary | ICD-10-CM

## 2012-09-22 LAB — COMPREHENSIVE METABOLIC PANEL
ALT: 14 U/L (ref 0–35)
AST: 22 U/L (ref 0–37)
Albumin: 4.3 g/dL (ref 3.5–5.2)
Alkaline Phosphatase: 64 U/L (ref 39–117)
BUN: 18 mg/dL (ref 6–23)
CO2: 29 mEq/L (ref 19–32)
Calcium: 9.5 mg/dL (ref 8.4–10.5)
Chloride: 102 mEq/L (ref 96–112)
Creatinine, Ser: 0.8 mg/dL (ref 0.4–1.2)
GFR: 73.8 mL/min (ref >60.00)
Glucose, Bld: 90 mg/dL (ref 70–99)
Potassium: 4.3 mEq/L (ref 3.5–5.1)
Sodium: 138 mEq/L (ref 135–145)
Total Bilirubin: 0.7 mg/dL (ref 0.3–1.2)
Total Protein: 7.3 g/dL (ref 6.0–8.3)

## 2012-09-22 LAB — CBC WITH DIFFERENTIAL/PLATELET
Basophils Absolute: 0 10*3/uL (ref 0.0–0.1)
Basophils Relative: 0.8 % (ref 0.0–3.0)
Eosinophils Absolute: 0.2 10*3/uL (ref 0.0–0.7)
Eosinophils Relative: 4.4 % (ref 0.0–5.0)
HCT: 36.9 % (ref 36.0–46.0)
Hemoglobin: 12.6 g/dL (ref 12.0–15.0)
Lymphocytes Relative: 20.6 % (ref 12.0–46.0)
Lymphs Abs: 1 10*3/uL (ref 0.7–4.0)
MCHC: 34.2 g/dL (ref 30.0–36.0)
MCV: 93.8 fl (ref 78.0–100.0)
Monocytes Absolute: 0.4 10*3/uL (ref 0.1–1.0)
Monocytes Relative: 8.7 % (ref 3.0–12.0)
Neutro Abs: 3 10*3/uL (ref 1.4–7.7)
Neutrophils Relative %: 65.5 % (ref 43.0–77.0)
Platelets: 211 10*3/uL (ref 150.0–400.0)
RBC: 3.93 Mil/uL (ref 3.87–5.11)
RDW: 14.4 % (ref 11.5–14.6)
WBC: 4.6 10*3/uL (ref 4.5–10.5)

## 2012-09-22 LAB — LIPID PANEL
Cholesterol: 119 mg/dL (ref 0–200)
HDL: 44.1 mg/dL (ref >39.00)
Total CHOL/HDL Ratio: 3
Triglycerides: 76 mg/dL (ref 0.0–149.0)

## 2012-09-22 LAB — TSH: TSH: 3.04 u[IU]/mL (ref 0.35–5.50)

## 2012-09-23 ENCOUNTER — Encounter: Payer: Self-pay | Admitting: Internal Medicine

## 2012-09-28 ENCOUNTER — Ambulatory Visit (INDEPENDENT_AMBULATORY_CARE_PROVIDER_SITE_OTHER): Payer: BC Managed Care – PPO | Admitting: Internal Medicine

## 2012-09-28 ENCOUNTER — Encounter: Payer: Self-pay | Admitting: Internal Medicine

## 2012-09-28 VITALS — BP 110/68 | HR 60 | Temp 98.1°F | Resp 16 | Wt 167.5 lb

## 2012-09-28 DIAGNOSIS — I10 Essential (primary) hypertension: Secondary | ICD-10-CM

## 2012-09-28 DIAGNOSIS — D126 Benign neoplasm of colon, unspecified: Secondary | ICD-10-CM

## 2012-09-28 DIAGNOSIS — M810 Age-related osteoporosis without current pathological fracture: Secondary | ICD-10-CM

## 2012-09-28 DIAGNOSIS — K635 Polyp of colon: Secondary | ICD-10-CM

## 2012-09-28 DIAGNOSIS — M25559 Pain in unspecified hip: Secondary | ICD-10-CM

## 2012-09-28 DIAGNOSIS — M25551 Pain in right hip: Secondary | ICD-10-CM

## 2012-09-28 DIAGNOSIS — Z0000 Encounter for general adult medical examination without abnormal findings: Secondary | ICD-10-CM

## 2012-09-28 DIAGNOSIS — E785 Hyperlipidemia, unspecified: Secondary | ICD-10-CM

## 2012-09-28 DIAGNOSIS — I679 Cerebrovascular disease, unspecified: Secondary | ICD-10-CM

## 2012-09-28 MED ORDER — NYSTATIN 100000 UNIT/GM EX OINT: TOPICAL_OINTMENT | Freq: Two times a day (BID) | CUTANEOUS | Status: DC

## 2012-09-28 NOTE — Assessment & Plan Note (Signed)
No evidence of fractures by prior exam and palin films.,  Improved with PT and swimming.

## 2012-09-28 NOTE — Assessment & Plan Note (Signed)
Annual comprehensive exam was done including breast, pelvic exam. All screenings have been addressed .  

## 2012-09-28 NOTE — Assessment & Plan Note (Addendum)
She has a history of left brin CVA with improving right sided weakness.  severe microvascular dz with microhemorrhages, question amyloidosis (per prior neurology eval ) and subcortical infarcts in 2008 and 2009 resulting in RL > RU extremity weakness   Referral to local neurology for follow up per patient request.

## 2012-09-28 NOTE — Assessment & Plan Note (Addendum)
Previously treated with alendronate which was causing esophagitis. fosomax was topped in 2011., and she has not had therapy since then  Except for calcitonin.  Will request Prolia from her insurance.  Vot D level is 60

## 2012-09-28 NOTE — Progress Notes (Signed)
jl Subjective:     Ellen Hamilton is a 62 y.o. female and is here for a comprehensive physical exam and follow up on multiple chronic medical issues.  The patient reports no problems.  History   Social History  . Marital Status: Married    Spouse Name: N/A    Number of Children: N/A  . Years of Education: N/A   Occupational History  . Not on file.   Social History Main Topics  . Smoking status: Never Smoker   . Smokeless tobacco: Not on file  . Alcohol Use: Yes  . Drug Use: No  . Sexually Active: Not on file   Other Topics Concern  . Not on file   Social History Narrative  . No narrative on file   Health Maintenance  Topic Date Due  . Influenza Vaccine  01/10/2013  . Mammogram  12/13/2013  . Pap Smear  11/03/2014  . Tetanus/tdap  12/27/2015  . Colonoscopy  04/19/2019  . Zostavax  Completed    The following portions of the patient's history were reviewed and updated as appropriate: allergies, current medications, past family history, past medical history, past social history, past surgical history and problem list.  Review of Systems A comprehensive review of systems was negative.   Objective:  BP 110/68  Pulse 60  Temp(Src) 98.1 F (36.7 C) (Oral)  Resp 16  Wt 167 lb 8 oz (75.978 kg)  BMI 29.2 kg/m2  SpO2 99%  General Appearance:    Alert, cooperative, no distress, appears stated age  Head:    Normocephalic, without obvious abnormality, atraumatic  Eyes:    PERRL, conjunctiva/corneas clear, EOM's intact, fundi    benign, both eyes  Ears:    Normal TM's and external ear canals, both ears  Nose:   Nares normal, septum midline, mucosa normal, no drainage    or sinus tenderness  Throat:   Lips, mucosa, and tongue normal; teeth and gums normal  Neck:   Supple, symmetrical, trachea midline, no adenopathy;    thyroid:  no enlargement/tenderness/nodules; no carotid   bruit or JVD  Back:     Symmetric, no curvature, ROM normal, no CVA tenderness  Lungs:      Clear to auscultation bilaterally, respirations unlabored  Chest Wall:    No tenderness or deformity   Heart:    Regular rate and rhythm, S1 and S2 normal, no murmur, rub   or gallop  Breast Exam:    No tenderness, masses, or nipple abnormality  Abdomen:     Soft, non-tender, bowel sounds active all four quadrants,    no masses, no organomegaly  Genitalia:    Normal female without lesion, discharge or tenderness     Extremities:   Extremities normal, atraumatic, no cyanosis or edema  Pulses:   2+ and symmetric all extremities  Skin:   Skin color, texture, turgor normal, no rashes or lesions  Lymph nodes:   Cervical, supraclavicular, and axillary nodes normal  Neurologic:   CNII-XII intact, normal strength, sensation and reflexes    throughout   Assessment:    Osteoporosis Previously treated with alendronate which was causing esophagitis. fosomax was topped in 2011., and she has not had therapy since then  Except for calcitonin.  Will request Prolia from her insurance.  Vot D level is 60   Routine general medical examination at a health care facility Annual comprehensive exam was done including breast, pelvic exam.  All screenings have been addressed .   Hypertension Well  controlled on current regimen. Renal function stable, no changes today.  Hyperlipidemia Well controlled on current regimen. Liver enzymes stable, no changes today.  Right hip pain No evidence of fractures by prior exam and palin films.,  Improved with PT and swimming.   Cerebrovascular disease She has a history of left brin CVA with improving right sided weakness.  severe microvascular dz with microhemorrhages, question amyloidosis (per prior neurology eval ) and subcortical infarcts in 2008 and 2009 resulting in RL > RU extremity weakness   Referral to local neurology for follow up per patient request.   Benign colonic polyp History of sessile polyp 2010 colonoscopy  Repeat due in 2015.   Updated  Medication List Outpatient Encounter Prescriptions as of 09/28/2012  Medication Sig Dispense Refill  . amLODipine (NORVASC) 10 MG tablet Take 1 tablet (10 mg total) by mouth daily.  90 tablet  1  . aspirin 81 MG tablet Take 81 mg by mouth daily.      . calcitonin, salmon, (MIACALCIN/FORTICAL) 200 UNIT/ACT nasal spray Place 1 spray into the nose daily. Alternate nostril every other day.  11.1 mL  0  . CALCIUM CITRATE PO Take 1,500 mg by mouth 3 (three) times daily.      . cholecalciferol (VITAMIN D) 1000 UNITS tablet Take 1,000 Units by mouth daily.      Marland Kitchen dipyridamole (PERSANTINE) 50 MG tablet Take 4 tablets (200 mg total) by mouth 2 (two) times daily.  270 tablet  1  . fish oil-omega-3 fatty acids 1000 MG capsule Take 1,000 mg by mouth daily.      . metoprolol succinate (TOPROL XL) 100 MG 24 hr tablet Take 1 tablet (100 mg total) by mouth daily.  90 tablet  1  . metroNIDAZOLE (METROCREAM) 0.75 % cream Apply 1 application topically 2 (two) times daily.       . Multiple Vitamin (MULTIVITAMIN) tablet Take 1 tablet by mouth daily.      Marland Kitchen omeprazole (PRILOSEC) 40 MG capsule Take 40 mg by mouth daily.       . rosuvastatin (CRESTOR) 40 MG tablet Take 1 tablet (40 mg total) by mouth daily.  90 tablet  1  . telmisartan-hydrochlorothiazide (MICARDIS HCT) 80-12.5 MG per tablet Take 1 tablet by mouth daily.  90 tablet  1  . vitamin B-12 (CYANOCOBALAMIN) 1000 MCG tablet Take 1,000 mcg by mouth every other day.      . nystatin ointment (MYCOSTATIN) Apply topically 2 (two) times daily. Until resolved  30 g  0   No facility-administered encounter medications on file as of 09/28/2012.

## 2012-09-28 NOTE — Patient Instructions (Addendum)
Do not check bp unless you are feeling poorly ( headache,  Tired,   Weak,  Feel like you are going to faint )   You can stop the telmisartan-hctz immediately if you have to stop one for  low bp.  (the metoprolol needs to be gradually stopped  By  Reducing the dose by 50% initially) .  You can stop the omeprazole, if you develop reflux symptoms ,  You can use otc pepcid or zantac as needed if your symptoms are not occurring daily.   Mammogram will be set up at [facility] when you are due   Referral to [facility] for your history of stroke  ';/;.,;.,..;.;.

## 2012-09-28 NOTE — Assessment & Plan Note (Signed)
History of sessile polyp 2010 colonoscopy  Repeat due in 2015.

## 2012-09-28 NOTE — Assessment & Plan Note (Signed)
Well controlled on current regimen. Liver enzymes stable, no changes today. 

## 2012-09-28 NOTE — Assessment & Plan Note (Signed)
Well controlled on current regimen. Renal function stable, no changes today. 

## 2012-10-10 ENCOUNTER — Encounter: Payer: Self-pay | Admitting: Internal Medicine

## 2012-10-14 ENCOUNTER — Other Ambulatory Visit: Payer: Self-pay

## 2012-10-18 ENCOUNTER — Encounter: Payer: Self-pay | Admitting: Internal Medicine

## 2012-10-18 ENCOUNTER — Telehealth: Payer: Self-pay | Admitting: Internal Medicine

## 2012-10-18 NOTE — Telephone Encounter (Signed)
Patient Information:  Caller Name: Ellen Hamilton  Phone: [phone]  Patient: Ellen, Hamilton  Gender: Female  DOB: [birth year]  Age: 62 Years  PCP: Duncan Dull (Adults only)  Office Follow Up:  Does the office need to follow up with this patient?: Yes  Instructions For The Office: No appts. available in Epic for 10/18/12. Patient declines to be seen at another office location.  Please return call to patient at [phone] regarding work in appt.  Patient uses [facility] on [address] at [phone].  RN Note:  Patient states she developed urinary pain, burning, frequency, urgency, onset 10/13/12. Denies flank pain at present. Denies hematuria. Care advice given per guidelines. Call back parameters reviewed. Patient verbalizes understanding. No appts. available in Epic for 10/18/12. Patient declines to be seen at another office location.  Please return call to patient at [phone] regarding work in appt.  Patient uses [facility] on [address] at [phone].  Symptoms  Reason For Call & Symptoms: Urinary pain/burning/urgency/frequency  Reviewed Health History In EMR: Yes  Reviewed Medications In EMR: Yes  Reviewed Allergies In EMR: Yes  Reviewed Surgeries / Procedures: Yes  Date of Onset of Symptoms: 10/13/2012  Treatments Tried: Cranberry Juice, increased water  Treatments Tried Worked: No  Guideline(s) Used:  Urination Pain - Female  Disposition Per Guideline:   See Today in Office  Reason For Disposition Reached:   Age > 50 years  Advice Given:  Fluids:   Drink extra fluids. Drink 8-10 glasses of liquids a day (Reason: to produce a dilute, non-irritating urine).  Cranberry Juice:   Some people think that drinking cranberry juice may help in fighting urinary tract infections. However, there is no good research that has ever proved this.  Call Back If:  You become worse.  Call Back If:   You become worse.  Patient Will Follow Care Advice:  YES

## 2012-10-18 NOTE — Telephone Encounter (Signed)
Please Advise

## 2012-10-19 NOTE — Telephone Encounter (Signed)
Patient returned your call . She went to Urgent Care.

## 2012-10-19 NOTE — Telephone Encounter (Signed)
Called patient and left message for her to return call back to office

## 2012-10-19 NOTE — Telephone Encounter (Signed)
Have her come by at 1:15 and put her in a room

## 2012-10-24 ENCOUNTER — Emergency Department: Payer: Self-pay | Admitting: Emergency Medicine

## 2012-10-24 LAB — CBC
RBC: 3.91 10*6/uL (ref 3.80–5.20)
RDW: 13.1 % (ref 11.5–14.5)

## 2012-10-24 LAB — COMPREHENSIVE METABOLIC PANEL
Albumin: 4.4 g/dL (ref 3.4–5.0)
Alkaline Phosphatase: 92 U/L (ref 50–136)
BUN: 21 mg/dL — ABNORMAL HIGH (ref 7–18)
Bilirubin,Total: 0.3 mg/dL (ref 0.2–1.0)
Calcium, Total: 9 mg/dL (ref 8.5–10.1)
Chloride: 99 mmol/L (ref 98–107)
Glucose: 111 mg/dL — ABNORMAL HIGH (ref 65–99)
Osmolality: 268 (ref 275–301)
Potassium: 4.4 mmol/L (ref 3.5–5.1)
SGOT(AST): 25 U/L (ref 15–37)
Sodium: 132 mmol/L — ABNORMAL LOW (ref 136–145)
Total Protein: 8.1 g/dL (ref 6.4–8.2)

## 2012-10-24 LAB — CK TOTAL AND CKMB (NOT AT ARMC): CK-MB: 3 ng/mL (ref 0.5–3.6)

## 2012-10-24 LAB — TROPONIN I: Troponin-I: <0.02 ng/mL

## 2012-10-25 LAB — URINALYSIS, COMPLETE
Blood: NEGATIVE
Hyaline Cast: 4
Protein: NEGATIVE
RBC,UR: 1 /HPF (ref 0–5)
Specific Gravity: 1.013 (ref 1.003–1.030)

## 2012-10-27 ENCOUNTER — Encounter: Payer: Self-pay | Admitting: Internal Medicine

## 2012-10-28 ENCOUNTER — Encounter: Payer: Self-pay | Admitting: Internal Medicine

## 2012-10-28 ENCOUNTER — Telehealth: Payer: Self-pay | Admitting: Internal Medicine

## 2012-10-28 NOTE — Telephone Encounter (Signed)
Patient wanting a sooner appointment for her ER follow up than 11-07-12. Stating she almost died.

## 2012-10-28 NOTE — Telephone Encounter (Signed)
Called patient scheduled for follow up appointment on the 25. Patient refused earlier appointment with Orville Govern NP. Faxed [facility] for visit Summary to ER. FYI

## 2012-11-03 ENCOUNTER — Ambulatory Visit (INDEPENDENT_AMBULATORY_CARE_PROVIDER_SITE_OTHER): Payer: BC Managed Care – PPO | Admitting: Internal Medicine

## 2012-11-03 ENCOUNTER — Encounter: Payer: Self-pay | Admitting: Internal Medicine

## 2012-11-03 VITALS — BP 124/80 | HR 82 | Temp 98.5°F | Resp 14 | Wt 181.5 lb

## 2012-11-03 DIAGNOSIS — N179 Acute kidney failure, unspecified: Secondary | ICD-10-CM

## 2012-11-03 DIAGNOSIS — Z8673 Personal history of transient ischemic attack (TIA), and cerebral infarction without residual deficits: Secondary | ICD-10-CM

## 2012-11-03 DIAGNOSIS — G4733 Obstructive sleep apnea (adult) (pediatric): Secondary | ICD-10-CM

## 2012-11-03 DIAGNOSIS — I959 Hypotension, unspecified: Secondary | ICD-10-CM

## 2012-11-03 MED ORDER — PHENAZOPYRIDINE HCL 200 MG PO TABS: 200.0000 mg | ORAL_TABLET | Freq: Three times a day (TID) | ORAL | Status: DC | PRN

## 2012-11-03 MED ORDER — DIPYRIDAMOLE 50 MG PO TABS: 200.0000 mg | ORAL_TABLET | Freq: Two times a day (BID) | ORAL | Status: DC

## 2012-11-03 NOTE — Patient Instructions (Addendum)
The antibiotic Septra caused acute renal failure . Which potentiated  your blood pressure medications and that's why your blood pressure dropped  I am rechecking your kidney function today to make sure it has recovered  You can resume your crestor.  Resume the blood pressure medications as described  We will send your records to Dr Gwenlyn Fudge and initiate a referral

## 2012-11-03 NOTE — Progress Notes (Signed)
Patient ID: Ellen Hamilton, female   DOB: 1950/07/21, 62 y.o.   MRN: [id number] Patient Active Problem List   Diagnosis Date Noted  . Hypotension, unspecified 11/03/2012  . Acute renal failure 11/03/2012  . Routine general medical examination at a health care facility 09/28/2012  . Right hip pain 07/28/2012  . Rt inguinal pain 07/28/2012  . History of thyroid nodule 06/13/2012  . Benign colonic polyp 06/13/2012  . OSA on CPAP 06/06/2012  . Cerebrovascular disease 06/04/2012  . Hypertension 06/04/2012  . Hyperlipidemia 06/04/2012  . Osteoporosis 06/04/2012    Subjective:  CC:   Chief Complaint  Patient presents with  . Follow-up    ER Follow up    HPI:   Ellen Hamilton a 62 y.o. female who presents for follow up after a recent episode of hypotension secondary to treatment for UTI.  She was treated for UTi by urgent care with Septra DS after waiting a day to hear from office  With no reply until the following day .  Blood pressure dropped steadily so she stopped her antihypertensives medications on Sunday when bp was 89/53 and pulse was 49 to 50.  No nausea or emesis or diarrhea.  Maintained good hydration as whe was trying to flush her system.  She Stopped the septra after 5 days.   After suspending the bp medications (beta blocker,  ARB and amlodipine) her hypotension began to resolve but her pulse became rapid 100 to 110,  Went to ER ,   Cr was 1.36.  ekg and cardiac enzymes  Were normal so she was sent home .    Past Medical History  Diagnosis Date  . Stroke     pt has had two  . Hypertension     History reviewed. No pertinent past surgical history.     The following portions of the patient's history were reviewed and updated as appropriate: Allergies, current medications, and problem list.    Review of Systems:   12 Pt  review of systems was negative except those addressed in the HPI,     History   Social History  . Marital Status: Married    Spouse  Name: N/A    Number of Children: N/A  . Years of Education: N/A   Occupational History  . Not on file.   Social History Main Topics  . Smoking status: Never Smoker   . Smokeless tobacco: Not on file  . Alcohol Use: Yes  . Drug Use: No  . Sexually Active: Not on file   Other Topics Concern  . Not on file   Social History Narrative  . No narrative on file    Objective:  BP 124/80  Pulse 82  Temp(Src) 98.5 F (36.9 C) (Oral)  Resp 14  Wt 181 lb 8 oz (82.328 kg)  BMI 31.64 kg/m2  SpO2 98%  General appearance: alert, cooperative and appears stated age Ears: normal TM's and external ear canals both ears Throat: lips, mucosa, and tongue normal; teeth and gums normal Neck: no adenopathy, no carotid bruit, supple, symmetrical, trachea midline and thyroid not enlarged, symmetric, no tenderness/mass/nodules Back: symmetric, no curvature. ROM normal. No CVA tenderness. Lungs: clear to auscultation bilaterally Heart: regular rate and rhythm, S1, S2 normal, no murmur, click, rub or gallop Abdomen: soft, non-tender; bowel sounds normal; no masses,  no organomegaly Pulses: 2+ and symmetric Skin: Skin color, texture, turgor normal. No rashes or lesions Lymph nodes: Cervical, supraclavicular, and axillary nodes normal.  Assessment and Plan:  Acute renal failure Secondary to septra prescribed by Urgent Care for UTI.  Cr per [facility] labs was 1.4 (normal 0.8) and has returned to normal.   Hypotension, unspecified Recent epidose lasting several days, well documented by patient checking BP 4 times daily, Secondary to ARF due to Septra. Resume medications as discussed once bp is > 140  A total of 40 minutes was spent with patient more than half of which was spent in counseling, reviewing records from other prviders and coordination of care. Updated Medication List Outpatient Encounter Prescriptions as of 11/03/2012  Medication Sig Dispense Refill  . amLODipine (NORVASC) 10 MG tablet Take  1 tablet (10 mg total) by mouth daily.  90 tablet  1  . aspirin 81 MG tablet Take 81 mg by mouth daily.      . calcitonin, salmon, (MIACALCIN/FORTICAL) 200 UNIT/ACT nasal spray Place 1 spray into the nose daily. Alternate nostril every other day.  11.1 mL  0  . CALCIUM CITRATE PO Take 1,500 mg by mouth 3 (three) times daily.      . cholecalciferol (VITAMIN D) 1000 UNITS tablet Take 1,000 Units by mouth daily.      Marland Kitchen dipyridamole (PERSANTINE) 50 MG tablet Take 4 tablets (200 mg total) by mouth 2 (two) times daily.  720 tablet  2  . fish oil-omega-3 fatty acids 1000 MG capsule Take 1,000 mg by mouth daily.      . metoprolol succinate (TOPROL XL) 100 MG 24 hr tablet Take 1 tablet (100 mg total) by mouth daily.  90 tablet  1  . metroNIDAZOLE (METROCREAM) 0.75 % cream Apply 1 application topically 2 (two) times daily.       . Multiple Vitamin (MULTIVITAMIN) tablet Take 1 tablet by mouth daily.      Marland Kitchen nystatin ointment (MYCOSTATIN) Apply topically 2 (two) times daily. Until resolved  30 g  0  . omeprazole (PRILOSEC) 40 MG capsule Take 40 mg by mouth daily.       . phenazopyridine (PYRIDIUM) 200 MG tablet Take 1 tablet (200 mg total) by mouth 3 (three) times daily as needed for pain.  10 tablet  0  . rosuvastatin (CRESTOR) 40 MG tablet Take 1 tablet (40 mg total) by mouth daily.  90 tablet  1  . telmisartan-hydrochlorothiazide (MICARDIS HCT) 80-12.5 MG per tablet Take 1 tablet by mouth daily.  90 tablet  1  . vitamin B-12 (CYANOCOBALAMIN) 1000 MCG tablet Take 1,000 mcg by mouth every other day.      . [DISCONTINUED] dipyridamole (PERSANTINE) 50 MG tablet Take 4 tablets (200 mg total) by mouth 2 (two) times daily.  270 tablet  1   No facility-administered encounter medications on file as of 11/03/2012.     Orders Placed This Encounter  Procedures  . Basic metabolic panel  . Ambulatory referral to Neurology    No Follow-up on file.

## 2012-11-04 ENCOUNTER — Encounter: Payer: Self-pay | Admitting: Internal Medicine

## 2012-11-04 LAB — BASIC METABOLIC PANEL
BUN: 13 mg/dL (ref 6–23)
Chloride: 103 mEq/L (ref 96–112)
Creatinine, Ser: 0.8 mg/dL (ref 0.4–1.2)
Glucose, Bld: 97 mg/dL (ref 70–99)
Potassium: 4.6 mEq/L (ref 3.5–5.1)

## 2012-11-04 NOTE — Assessment & Plan Note (Addendum)
Secondary to septra prescribed by Urgent Care for UTI.  Cr per [facility] labs was 1.4 (normal 0.8) and has returned to normal.

## 2012-11-04 NOTE — Assessment & Plan Note (Signed)
Recent epidose lasting several days, well documented by patient checking BP 4 times daily, Secondary to ARF due to Septra. Resume medications as discussed once bp is > 140

## 2012-11-09 ENCOUNTER — Encounter: Payer: Self-pay | Admitting: Internal Medicine

## 2012-11-14 ENCOUNTER — Encounter: Payer: Self-pay | Admitting: Internal Medicine

## 2012-12-09 ENCOUNTER — Other Ambulatory Visit: Payer: Self-pay | Admitting: Internal Medicine

## 2012-12-10 ENCOUNTER — Encounter: Payer: Self-pay | Admitting: Internal Medicine

## 2013-01-10 ENCOUNTER — Encounter: Payer: Self-pay | Admitting: Internal Medicine

## 2013-01-13 ENCOUNTER — Ambulatory Visit (INDEPENDENT_AMBULATORY_CARE_PROVIDER_SITE_OTHER): Payer: BC Managed Care – PPO | Admitting: Internal Medicine

## 2013-01-13 ENCOUNTER — Encounter: Payer: Self-pay | Admitting: Internal Medicine

## 2013-01-13 VITALS — BP 112/78 | HR 57 | Temp 98.1°F | Resp 12 | Ht 63.0 in | Wt 179.8 lb

## 2013-01-13 DIAGNOSIS — I679 Cerebrovascular disease, unspecified: Secondary | ICD-10-CM

## 2013-01-13 DIAGNOSIS — E785 Hyperlipidemia, unspecified: Secondary | ICD-10-CM

## 2013-01-13 DIAGNOSIS — I10 Essential (primary) hypertension: Secondary | ICD-10-CM

## 2013-01-13 DIAGNOSIS — G4733 Obstructive sleep apnea (adult) (pediatric): Secondary | ICD-10-CM

## 2013-01-13 DIAGNOSIS — Z1239 Encounter for other screening for malignant neoplasm of breast: Secondary | ICD-10-CM

## 2013-01-13 MED ORDER — AMLODIPINE BESYLATE 10 MG PO TABS: 5.0000 mg | ORAL_TABLET | Freq: Every day | ORAL | Status: DC

## 2013-01-13 NOTE — Patient Instructions (Addendum)
Your blood pressure is much better on less medication  I recommend that we reduce your amlodiipine to 5  Mg daily  If bps begin to stay above 140,   resume 10 mg amlodipine but change to evening instead

## 2013-01-13 NOTE — Assessment & Plan Note (Signed)
Using cpap every night at least 8 hours, per last test.

## 2013-01-13 NOTE — Progress Notes (Signed)
Patient ID: Ellen Hamilton, female   DOB: October 10, 1950, 62 y.o.   MRN: [id number]  Patient Active Problem List   Diagnosis Date Noted  . Hypotension, unspecified 11/03/2012  . Acute renal failure 11/03/2012  . Routine general medical examination at a health care facility 09/28/2012  . Right hip pain 07/28/2012  . Rt inguinal pain 07/28/2012  . History of thyroid nodule 06/13/2012  . Benign colonic polyp 06/13/2012  . OSA on CPAP 06/06/2012  . Cerebrovascular disease 06/04/2012  . Hypertension 06/04/2012  . Hyperlipidemia 06/04/2012  . Osteoporosis 06/04/2012    Subjective:  CC:   Chief Complaint  Patient presents with  . Follow-up    Blood pressure    HPI:   Ellen Hamilton a 62 y.o. female who presents for follow up on multiple issues including hypertension,  CVA s/p stoke with progressive improvement in right sided weakness.    1)  After her bout with  UTI, persistent hypotension necessitated stopping many of her medications.  She resumed metoprolol  In mid July and bps have been well controlled without the need for micardis.  She checks her bp daily and has graphed out her pressures with great detail on an  excel spread sheet.  2) CVA:  Her deficits are improving steadily with continued PT .  She follows up with neurologist next week at [facility]. taking dipyridamole and asa.    Past Medical History  Diagnosis Date  . Stroke     pt has had two  . Hypertension     History reviewed. No pertinent past surgical history.     The following portions of the patient's history were reviewed and updated as appropriate: Allergies, current medications, and problem list.    Review of Systems:   12 Pt  review of systems was negative except those addressed in the HPI,     History   Social History  . Marital Status: Married    Spouse Name: N/A    Number of Children: N/A  . Years of Education: N/A   Occupational History  . Not on file.   Social History Main Topics   . Smoking status: Never Smoker   . Smokeless tobacco: Not on file  . Alcohol Use: Yes  . Drug Use: No  . Sexual Activity: Yes   Other Topics Concern  . Not on file   Social History Narrative  . No narrative on file    Objective:  Filed Vitals:   01/13/13 0946  BP: 112/78  Pulse: 57  Temp: 98.1 F (36.7 C)  Resp: 12     General appearance: alert, cooperative and appears stated age Ears: normal TM's and external ear canals both ears Throat: lips, mucosa, and tongue normal; teeth and gums normal Neck: no adenopathy, no carotid bruit, supple, symmetrical, trachea midline and thyroid not enlarged, symmetric, no tenderness/mass/nodules Back: symmetric, no curvature. ROM normal. No CVA tenderness. Lungs: clear to auscultation bilaterally Heart: regular rate and rhythm, S1, S2 normal, no murmur, click, rub or gallop Abdomen: soft, non-tender; bowel sounds normal; no masses,  no organomegaly Pulses: 2+ and symmetric Skin: Skin color, texture, turgor normal. No rashes or lesions Lymph nodes: Cervical, supraclavicular, and axillary nodes normal.  Assessment and Plan:  Cerebrovascular disease Still going to PT at [facility] twice week and using the pool 5/week,  No longer needing the cane.  The right leg weakness has improved dramatically. Sees her neurologist  dr Odis Hollingshead [facility] sept 23   OSA on CPAP  Using cpap every night at least 8 hours, per last test.   Hyperlipidemia LDL 60 on crestor 40 mg daily.  LFTs nornmal.  No changes today   Hypertension bp still low,  Will reduce amlodipine to 5 mg daily and follow    Updated Medication List Outpatient Encounter Prescriptions as of 01/13/2013  Medication Sig Dispense Refill  . amLODipine (NORVASC) 10 MG tablet Take 0.5 tablets (5 mg total) by mouth daily.  90 tablet  1  . aspirin 81 MG tablet Take 81 mg by mouth daily.      . calcitonin, salmon, (MIACALCIN/FORTICAL) 200 UNIT/ACT nasal spray Place 1 spray into the nose daily.  Alternate nostril every other day.  11.1 mL  0  . CALCIUM CITRATE PO Take 1,500 mg by mouth 3 (three) times daily.      . cholecalciferol (VITAMIN D) 1000 UNITS tablet Take 1,000 Units by mouth daily.      . CRESTOR 40 MG tablet TAKE 1 TABLET DAILY  90 tablet  1  . dipyridamole (PERSANTINE) 50 MG tablet Take 4 tablets (200 mg total) by mouth 2 (two) times daily.  720 tablet  2  . fish oil-omega-3 fatty acids 1000 MG capsule Take 1,000 mg by mouth daily.      . metoprolol succinate (TOPROL XL) 100 MG 24 hr tablet Take 1 tablet (100 mg total) by mouth daily.  90 tablet  1  . metroNIDAZOLE (METROCREAM) 0.75 % cream Apply 1 application topically 2 (two) times daily.       . Multiple Vitamin (MULTIVITAMIN) tablet Take 1 tablet by mouth daily.      Marland Kitchen omeprazole (PRILOSEC) 40 MG capsule Take 40 mg by mouth daily.       . vitamin B-12 (CYANOCOBALAMIN) 1000 MCG tablet Take 1,000 mcg by mouth every other day.      . [DISCONTINUED] amLODipine (NORVASC) 10 MG tablet Take 1 tablet (10 mg total) by mouth daily.  90 tablet  1  . nystatin ointment (MYCOSTATIN) Apply topically 2 (two) times daily. Until resolved  30 g  0  . phenazopyridine (PYRIDIUM) 200 MG tablet Take 1 tablet (200 mg total) by mouth 3 (three) times daily as needed for pain.  10 tablet  0  . telmisartan-hydrochlorothiazide (MICARDIS HCT) 80-12.5 MG per tablet Take 1 tablet by mouth daily.  90 tablet  1   No facility-administered encounter medications on file as of 01/13/2013.     Orders Placed This Encounter  Procedures  . MM Digital Screening    No Follow-up on file.

## 2013-01-13 NOTE — Assessment & Plan Note (Addendum)
Still going to PT at [facility] twice week and using the pool 5/week,  No longer needing the cane.  The right leg weakness has improved dramatically. Sees her neurologist  dr Odis Hollingshead [facility] sept 23

## 2013-01-15 ENCOUNTER — Encounter: Payer: Self-pay | Admitting: Internal Medicine

## 2013-01-15 NOTE — Assessment & Plan Note (Signed)
bp still low,  Will reduce amlodipine to 5 mg daily and follow

## 2013-01-15 NOTE — Assessment & Plan Note (Signed)
LDL 60 on crestor 40 mg daily.  LFTs nornmal.  No changes today

## 2013-01-21 ENCOUNTER — Other Ambulatory Visit: Payer: Self-pay | Admitting: Internal Medicine

## 2013-01-21 ENCOUNTER — Encounter: Payer: Self-pay | Admitting: Internal Medicine

## 2013-01-21 MED ORDER — DIPYRIDAMOLE 50 MG PO TABS: 200.0000 mg | ORAL_TABLET | Freq: Two times a day (BID) | ORAL | Status: DC

## 2013-01-21 NOTE — Telephone Encounter (Signed)
Ok to refill 

## 2013-02-09 ENCOUNTER — Encounter: Payer: Self-pay | Admitting: Internal Medicine

## 2013-02-16 ENCOUNTER — Encounter: Payer: Self-pay | Admitting: Emergency Medicine

## 2013-02-16 ENCOUNTER — Encounter: Payer: Self-pay | Admitting: Internal Medicine

## 2013-03-30 ENCOUNTER — Other Ambulatory Visit: Payer: Self-pay | Admitting: Internal Medicine

## 2013-03-30 ENCOUNTER — Ambulatory Visit
Admission: RE | Admit: 2013-03-30 | Discharge: 2013-03-30 | Disposition: A | Discharge status: Home / Self Care | Payer: BC Managed Care – PPO | Source: Ambulatory Visit | Attending: Internal Medicine | Admitting: Internal Medicine

## 2013-03-30 DIAGNOSIS — Z1239 Encounter for other screening for malignant neoplasm of breast: Secondary | ICD-10-CM

## 2013-03-31 LAB — HM MAMMOGRAPHY: HM MAMMO: NORMAL

## 2013-04-01 ENCOUNTER — Encounter: Payer: Self-pay | Admitting: Internal Medicine

## 2013-04-12 ENCOUNTER — Encounter: Payer: Self-pay | Admitting: Internal Medicine

## 2013-09-03 ENCOUNTER — Other Ambulatory Visit: Payer: Self-pay | Admitting: Internal Medicine

## 2013-09-03 DIAGNOSIS — E785 Hyperlipidemia, unspecified: Secondary | ICD-10-CM

## 2013-09-03 DIAGNOSIS — Z79899 Other long term (current) drug therapy: Secondary | ICD-10-CM

## 2013-09-03 DIAGNOSIS — E559 Vitamin D deficiency, unspecified: Secondary | ICD-10-CM

## 2013-09-05 NOTE — Telephone Encounter (Signed)
Last visit 01/13/13, next scheduled appt 09/28/13. Last labs May and June 2014. What labs would you like done?

## 2013-09-06 NOTE — Telephone Encounter (Signed)
Labs orderedd,  Fasting,  No refills until labs done due to the amoutn of time that has lapsed

## 2013-09-06 NOTE — Telephone Encounter (Signed)
Sent mychart message on need for lab appt.

## 2013-09-09 ENCOUNTER — Other Ambulatory Visit (INDEPENDENT_AMBULATORY_CARE_PROVIDER_SITE_OTHER): Payer: BC Managed Care – PPO

## 2013-09-09 DIAGNOSIS — E559 Vitamin D deficiency, unspecified: Secondary | ICD-10-CM

## 2013-09-09 DIAGNOSIS — Z79899 Other long term (current) drug therapy: Secondary | ICD-10-CM

## 2013-09-09 DIAGNOSIS — E785 Hyperlipidemia, unspecified: Secondary | ICD-10-CM

## 2013-09-09 LAB — LIPID PANEL
CHOLESTEROL: 119 mg/dL (ref 0–200)
HDL: 42.7 mg/dL (ref >39.00)
LDL Cholesterol: 55 mg/dL (ref 0–99)
TRIGLYCERIDES: 105 mg/dL (ref 0.0–149.0)
Total CHOL/HDL Ratio: 3
VLDL: 21 mg/dL (ref 0.0–40.0)

## 2013-09-09 LAB — COMPREHENSIVE METABOLIC PANEL
ALT: 17 U/L (ref 0–35)
AST: 27 U/L (ref 0–37)
Albumin: 4.5 g/dL (ref 3.5–5.2)
Alkaline Phosphatase: 49 U/L (ref 39–117)
BILIRUBIN TOTAL: 0.5 mg/dL (ref 0.3–1.2)
BUN: 18 mg/dL (ref 6–23)
CO2: 27 meq/L (ref 19–32)
CREATININE: 0.7 mg/dL (ref 0.4–1.2)
Calcium: 9.6 mg/dL (ref 8.4–10.5)
Chloride: 106 mEq/L (ref 96–112)
GFR: 92.6 mL/min (ref >60.00)
GLUCOSE: 89 mg/dL (ref 70–99)
Potassium: 4.7 mEq/L (ref 3.5–5.1)
Sodium: 141 mEq/L (ref 135–145)
Total Protein: 7.5 g/dL (ref 6.0–8.3)

## 2013-09-10 ENCOUNTER — Encounter: Payer: Self-pay | Admitting: Internal Medicine

## 2013-09-10 LAB — VITAMIN D 25 HYDROXY (VIT D DEFICIENCY, FRACTURES): VIT D 25 HYDROXY: 56 ng/mL (ref 30–89)

## 2013-09-15 DIAGNOSIS — L089 Local infection of the skin and subcutaneous tissue, unspecified: Secondary | ICD-10-CM | POA: Insufficient documentation

## 2013-09-28 ENCOUNTER — Ambulatory Visit (INDEPENDENT_AMBULATORY_CARE_PROVIDER_SITE_OTHER): Payer: BC Managed Care – PPO | Admitting: Internal Medicine

## 2013-09-28 ENCOUNTER — Encounter: Payer: Self-pay | Admitting: Internal Medicine

## 2013-09-28 VITALS — BP 124/76 | HR 67 | Temp 98.7°F | Resp 16 | Ht 63.0 in | Wt 178.5 lb

## 2013-09-28 DIAGNOSIS — I6350 Cerebral infarction due to unspecified occlusion or stenosis of unspecified cerebral artery: Secondary | ICD-10-CM

## 2013-09-28 DIAGNOSIS — G4733 Obstructive sleep apnea (adult) (pediatric): Secondary | ICD-10-CM

## 2013-09-28 DIAGNOSIS — Z0000 Encounter for general adult medical examination without abnormal findings: Secondary | ICD-10-CM

## 2013-09-28 DIAGNOSIS — L723 Sebaceous cyst: Secondary | ICD-10-CM

## 2013-09-28 DIAGNOSIS — I679 Cerebrovascular disease, unspecified: Secondary | ICD-10-CM

## 2013-09-28 DIAGNOSIS — Z862 Personal history of diseases of the blood and blood-forming organs and certain disorders involving the immune mechanism: Secondary | ICD-10-CM

## 2013-09-28 DIAGNOSIS — I10 Essential (primary) hypertension: Secondary | ICD-10-CM

## 2013-09-28 DIAGNOSIS — Z9989 Dependence on other enabling machines and devices: Secondary | ICD-10-CM

## 2013-09-28 DIAGNOSIS — E785 Hyperlipidemia, unspecified: Secondary | ICD-10-CM

## 2013-09-28 DIAGNOSIS — I639 Cerebral infarction, unspecified: Secondary | ICD-10-CM

## 2013-09-28 DIAGNOSIS — Z1239 Encounter for other screening for malignant neoplasm of breast: Secondary | ICD-10-CM

## 2013-09-28 DIAGNOSIS — Z8639 Personal history of other endocrine, nutritional and metabolic disease: Secondary | ICD-10-CM

## 2013-09-28 DIAGNOSIS — L089 Local infection of the skin and subcutaneous tissue, unspecified: Secondary | ICD-10-CM

## 2013-09-28 MED ORDER — OMEPRAZOLE 40 MG PO CPDR: 40.0000 mg | DELAYED_RELEASE_CAPSULE | Freq: Every day | ORAL | Status: DC

## 2013-09-28 MED ORDER — AMLODIPINE BESYLATE 5 MG PO TABS: 5.0000 mg | ORAL_TABLET | Freq: Every day | ORAL | Status: DC

## 2013-09-28 NOTE — Progress Notes (Signed)
Patient ID: Ellen Hamilton, female   DOB: 08/14/1950, 63 y.o.   MRN: [id number]   The patient is here for annual Medicare wellness examination and management of other chronic and acute problems.   The risk factors are reflected in the social history.  The roster of all physicians providing medical care to patient - is listed in the Snapshot section of the chart.  Activities of daily living:  The patient is 100% independent in all ADLs: dressing, toileting, feeding as well as independent mobility  Home safety : The patient has smoke detectors in the home. They wear seatbelts.  There are no firearms at home. There is no violence in the home.   There is no risks for hepatitis, STDs or HIV. There is no   history of blood transfusion. They have no travel history to infectious disease endemic areas of the world.  The patient has seen their dentist in the last six month. They have seen their eye doctor in the last year. They admit to slight hearing difficulty with regard to whispered voices and some television programs.  They have deferred audiologic testing in the last year.  They do not  have excessive sun exposure. Discussed the need for sun protection: hats, long sleeves and use of sunscreen if there is significant sun exposure.   Diet: the importance of a healthy diet is discussed. They do have a healthy diet.  The benefits of regular aerobic exercise were discussed. She walks 4 times per week ,  20 minutes.   Depression screen: there are no signs or vegative symptoms of depression- irritability, change in appetite, anhedonia, sadness/tearfullness.  Cognitive assessment: the patient manages all their financial and personal affairs and is actively engaged. They could relate day,date,year and events; recalled 2/3 objects at 3 minutes; performed clock-face test normally.  The following portions of the patient's history were reviewed and updated as appropriate: allergies, current medications, past  family history, past medical history,  past surgical history, past social history  and problem list.  Visual acuity was not assessed per patient preference since she has regular follow up with her ophthalmologist. Hearing and body mass index were assessed and reviewed.   During the course of the visit the patient was educated and counseled about appropriate screening and preventive services including : fall prevention , diabetes screening, nutrition counseling, colorectal cancer screening, and recommended immunizations.    Objective:  General Appearance:    Alert, cooperative, no distress, appears stated age  Head:    Normocephalic, without obvious abnormality, atraumatic  Eyes:    PERRL, conjunctiva/corneas clear, EOM's intact, fundi    benign, both eyes  Ears:    Normal TM's and external ear canals, both ears  Nose:   Nares normal, septum midline, mucosa normal, no drainage    or sinus tenderness  Throat:   Lips, mucosa, and tongue normal; teeth and gums normal  Neck:   Supple, symmetrical, trachea midline, no adenopathy;    thyroid:  no enlargement/tenderness/nodules; no carotid   bruit or JVD  Back:     Symmetric, no curvature, ROM normal, no CVA tenderness.  Quarter sized raised cyst , nonfluctuant on right side of thorax.   Lungs:     Clear to auscultation bilaterally, respirations unlabored  Chest Wall:    No tenderness or deformity   Heart:    Regular rate and rhythm, S1 and S2 normal, no murmur, rub   or gallop  Breast Exam:    No tenderness, masses,  or nipple abnormality  Abdomen:     Soft, non-tender, bowel sounds active all four quadrants,    no masses, no organomegaly  Genitalia:    Pelvic: cervix normal in appearance, external genitalia normal, no adnexal masses or tenderness, no cervical motion tenderness, rectovaginal septum normal, uterus normal size, shape, and consistency and vagina normal without discharge  Extremities:   Extremities normal, atraumatic, no cyanosis or  edema  Pulses:   2+ and symmetric all extremities  Skin:   Skin color, texture, turgor normal, no rashes or lesions  Lymph nodes:   Cervical, supraclavicular, and axillary nodes normal  Neurologic:   CNII-XII intact, normal strength, sensation and reflexes    Throughout     Assessment and Plan:  Infected sebaceous cyst of skin No appreciable change with doxycycline and warm compresses.  Refer to Hiram Comber /Sankar for incision and drainage.   Cerebrovascular disease She has benefitted from PT and daily swimming, and is requesting Pt referral due to recent deterioriation due to loss of access to the pool.   Routine general medical examination at a health care facility Annual comprehensive exam was done including breast, excluding pelvic and PAP smear. All screenings have been addressed .   Hyperlipidemia LDL is 55 on crestor 40 mg daily.  LFTs nornmal.  No changes today   Lab Results  Component Value Date   CHOL 119 09/09/2013   HDL 42.70 09/09/2013   LDLCALC 55 09/09/2013   TRIG 105.0 09/09/2013   CHOLHDL 3 09/09/2013   Lab Results  Component Value Date   ALT 17 09/09/2013   AST 27 09/09/2013   ALKPHOS 49 09/09/2013   BILITOT 0.5 09/09/2013     History of thyroid nodule Lab Results  Component Value Date   TSH 3.04 09/22/2012     Hypertension Well controlled on current regimen including amlodipine 5 mg daily. . Renal function stable, no changes today.  Lab Results  Component Value Date   CREATININE 0.7 09/09/2013      OSA on CPAP Diagnosed by sleep study. She is wearing her CPAP every night a minimum of 6 hours per night.    Updated Medication List Outpatient Encounter Prescriptions as of 09/28/2013  Medication Sig  . amLODipine (NORVASC) 5 MG tablet Take 1 tablet (5 mg total) by mouth daily.  Marland Kitchen aspirin 81 MG tablet Take 81 mg by mouth daily.  . calcitonin, salmon, (MIACALCIN/FORTICAL) 200 UNIT/ACT nasal spray USE 1 SPRAY INTO THE NOSE DAILY, ALTERNATE NOSTRIL EVERY OTHER DAY  .  CALCIUM CITRATE PO Take 1,500 mg by mouth 3 (three) times daily.  . cholecalciferol (VITAMIN D) 1000 UNITS tablet Take 1,000 Units by mouth daily.  . CRESTOR 40 MG tablet TAKE 1 TABLET DAILY  . dipyridamole (PERSANTINE) 50 MG tablet Take 4 tablets (200 mg total) by mouth 2 (two) times daily.  . fish oil-omega-3 fatty acids 1000 MG capsule Take 1,000 mg by mouth daily.  . metoprolol succinate (TOPROL-XL) 100 MG 24 hr tablet TAKE 1 TABLET DAILY  . metroNIDAZOLE (METROCREAM) 0.75 % cream Apply 1 application topically 2 (two) times daily.   . Multiple Vitamin (MULTIVITAMIN) tablet Take 1 tablet by mouth daily.  Marland Kitchen omeprazole (PRILOSEC) 40 MG capsule Take 1 capsule (40 mg total) by mouth daily.  . vitamin B-12 (CYANOCOBALAMIN) 1000 MCG tablet Take 1,000 mcg by mouth every other day.  . [DISCONTINUED] amLODipine (NORVASC) 5 MG tablet Take 5 mg by mouth daily.  . [DISCONTINUED] omeprazole (PRILOSEC) 40 MG capsule Take  40 mg by mouth daily.   . [DISCONTINUED] omeprazole (PRILOSEC) 40 MG capsule Take 1 capsule (40 mg total) by mouth daily.  Marland Kitchen nystatin ointment (MYCOSTATIN) Apply topically 2 (two) times daily. Until resolved  . phenazopyridine (PYRIDIUM) 200 MG tablet Take 1 tablet (200 mg total) by mouth 3 (three) times daily as needed for pain.  Marland Kitchen telmisartan-hydrochlorothiazide (MICARDIS HCT) 80-12.5 MG per tablet Take 1 tablet by mouth daily.  . [DISCONTINUED] amLODipine (NORVASC) 10 MG tablet TAKE 1 TABLET DAILY

## 2013-09-28 NOTE — Patient Instructions (Addendum)
I would like you to lose 11 lbs over the next 6 months.  This will lower your BMI to 29  I will refer you to Dr Bary Castilla for removal of the cyst on your back .  If you do not hear from [place] in a week , call back  Referral to [facility] for PT also in process   Fasting labs due in November   This is  My  version of a  "Low GI"  Diet:  It is synonymous with the Mediterranean lifestyle/diet , so there aremany substitutions which will be made .  It will  allow you to lose 4 to 8  lbs  per month if you follow it carefully.  Your goal with exercise is a minimum of 30 minutes of aerobic exercise 5 days per week (Walking does not count once it becomes easy!)    All of the foods can be found at grocery stores and in bulk at [employer].  The Atkins protein bars and shakes are available in more varieties at [employer], [employer] and [place].     7 AM Breakfast:  Choose from the following:  Low carbohydrate Protein  Shakes (I recommend the EAS AdvantEdge "Carb Control" shakes  Or the low carb shakes by Atkins.    2.5 carbs   Arnold's "Sandwhich Thin"toasted  w/ peanut butter (no jelly: about 20 net carbs  "Bagel Thin" with cream cheese and salmon: about 20 carbs   a scrambled egg/bacon/cheese burrito made with Mission's "carb balance" whole wheat tortilla  (about 10 net carbs )   Avoid cereal and bananas, oatmeal and cream of wheat and grits. They are loaded with carbohydrates!   10 AM: high protein snack  Protein bar by Atkins (the snack size, under 200 cal, usually < 6 net carbs).    A stick of cheese:  Around 1 carb,  100 cal     Dannon Light n Fit [place] Yogurt  (80 cal, 8 carbs)  Other so called "protein bars" and Greek yogurts tend to be loaded with carbohydrates.  Remember, in food advertising, the word "energy" is synonymous for " carbohydrate."  Lunch:   A Sandwich using the bread choices listed, Can use any  Eggs,  lunchmeat, grilled meat or canned tuna), avocado, regular mayo/mustard  and  cheese.  A Salad using blue cheese, ranch,  Goddess or vinagrette,  No croutons or "confetti" and no "candied nuts" but regular nuts OK.   No pretzels or chips.  Pickles and miniature sweet peppers are a good low carb alternative that provide a "crunch"  The bread is the only source of carbohydrate in a sandwich and  can be decreased by trying some of these alternatives to traditional loaf bread  Joseph's makes a pita bread and a flat bread that are 50 cal and 4 net carbs available at [place] and [place].  This can be toasted to use with hummous as well  Toufayan makes a low carb flatbread that's 100 cal and 9 net carbs available at [employer] and [employer]'s makes 2 sizes of  Low carb whole wheat tortilla  (The large one is 210 cal and 6 net carbs) Avoid "Low fat dressings, as well as Barry Brunner and [place] dressings They are loaded with sugar!   3 PM/ Mid day  Snack:  Consider  1 ounce of  almonds, walnuts, pistachios, pecans, peanuts,  Macadamia nuts or a nut medley.  Avoid "granola"; the dried  cranberries and raisins are loaded with carbohydrates. Mixed nuts as long as there are no raisins,  cranberries or dried fruit.    Try the prosciutto/mozzarella cheese sticks by Fiorruci  In deli /backery section   High protein      6 PM  Dinner:     Meat/fowl/fish with a green salad, and either broccoli, cauliflower, green beans, spinach, brussel sprouts or  Lima beans. DO NOT BREAD THE PROTEIN!!      There is a low carb pasta by Dreamfield's that is acceptable and tastes great: only 5 digestible carbs/serving.( All grocery stores but BJs carry it )  Try Hurley Cisco Angelo's chicken piccata or chicken or eggplant parm over low carb pasta.(Lowes and BJs)   Marjory Lies Sanchez's "Carnitas" (pulled pork, no sauce,  0 carbs) or his beef pot roast to make a dinner burrito (at BJ's)  Pesto over low carb pasta (bj's sells a good quality pesto in the center refrigerated section of the deli   Try satueeing  Cheral Marker  with mushroooms  Whole wheat pasta is still full of digestible carbs and  Not as low in glycemic index as Dreamfield's.   Brown rice is still rice,  So skip the rice and noodles if you eat [place] or [place] (or at least limit to 1/2 cup)  9 PM snack :   Breyer's "low carb" fudgsicle or  ice cream bar (Carb Smart line), or  Weight Watcher's ice cream bar , or another "no sugar added" ice cream;  a serving of fresh berries/cherries with whipped cream   Cheese or DANNON'S LlGHT N FIT GREEK YOGURT  8 ounces of [place] unsweetened almond/cococunut milk    Avoid bananas, pineapple, grapes  and watermelon on a regular basis because they are high in sugar.  THINK OF THEM AS DESSERT  Remember that snack Substitutions should be less than 10 NET carbs per serving and meals < 20 carbs. Remember to subtract fiber grams to get the "net carbs."

## 2013-09-30 DIAGNOSIS — L723 Sebaceous cyst: Secondary | ICD-10-CM

## 2013-09-30 DIAGNOSIS — L089 Local infection of the skin and subcutaneous tissue, unspecified: Secondary | ICD-10-CM | POA: Insufficient documentation

## 2013-09-30 NOTE — Assessment & Plan Note (Addendum)
She has benefitted from PT and daily swimming, and is requesting Pt referral due to recent deterioriation due to loss of access to the pool.

## 2013-09-30 NOTE — Assessment & Plan Note (Signed)
Well controlled on current regimen including amlodipine 5 mg daily. . Renal function stable, no changes today.  Lab Results  Component Value Date   CREATININE 0.7 09/09/2013

## 2013-09-30 NOTE — Assessment & Plan Note (Signed)
LDL is 55 on crestor 40 mg daily.  LFTs nornmal.  No changes today   Lab Results  Component Value Date   CHOL 119 09/09/2013   HDL 42.70 09/09/2013   LDLCALC 55 09/09/2013   TRIG 105.0 09/09/2013   CHOLHDL 3 09/09/2013   Lab Results  Component Value Date   ALT 17 09/09/2013   AST 27 09/09/2013   ALKPHOS 49 09/09/2013   BILITOT 0.5 09/09/2013

## 2013-09-30 NOTE — Assessment & Plan Note (Signed)
No appreciable change with doxycycline and warm compresses.  Refer to Hiram Comber /Sankar for incision and drainage.

## 2013-09-30 NOTE — Assessment & Plan Note (Signed)
Diagnosed by sleep study. She is wearing her CPAP every night a minimum of 6 hours per night.  

## 2013-09-30 NOTE — Assessment & Plan Note (Signed)
Lab Results  Component Value Date   TSH 3.04 09/22/2012

## 2013-09-30 NOTE — Assessment & Plan Note (Addendum)
Annual comprehensive exam was done including breast, excluding pelvic and PAP smear. All screenings have been addressed .  

## 2013-10-04 ENCOUNTER — Encounter: Payer: Self-pay | Admitting: Internal Medicine

## 2013-10-04 ENCOUNTER — Encounter: Payer: Self-pay | Admitting: *Deleted

## 2013-10-12 ENCOUNTER — Ambulatory Visit: Payer: Self-pay | Admitting: General Surgery

## 2013-10-26 ENCOUNTER — Ambulatory Visit: Payer: Self-pay | Admitting: General Surgery

## 2013-11-14 ENCOUNTER — Encounter: Payer: Self-pay | Admitting: Internal Medicine

## 2013-11-19 ENCOUNTER — Other Ambulatory Visit: Payer: Self-pay | Admitting: Internal Medicine

## 2013-12-02 ENCOUNTER — Other Ambulatory Visit: Payer: Self-pay | Admitting: Internal Medicine

## 2013-12-10 ENCOUNTER — Encounter: Payer: Self-pay | Admitting: Internal Medicine

## 2014-01-03 ENCOUNTER — Encounter: Payer: Self-pay | Admitting: Internal Medicine

## 2014-01-03 ENCOUNTER — Ambulatory Visit (INDEPENDENT_AMBULATORY_CARE_PROVIDER_SITE_OTHER): Payer: BC Managed Care – PPO | Admitting: Internal Medicine

## 2014-01-03 VITALS — BP 120/72 | HR 57 | Temp 97.8°F | Resp 16

## 2014-01-03 DIAGNOSIS — R112 Nausea with vomiting, unspecified: Secondary | ICD-10-CM

## 2014-01-03 DIAGNOSIS — R61 Generalized hyperhidrosis: Secondary | ICD-10-CM

## 2014-01-03 MED ORDER — LACTULOSE 20 GM/30ML PO SOLN: 30.0000 mL | Freq: Every day | ORAL | Status: DC

## 2014-01-03 NOTE — Progress Notes (Signed)
Pre-visit discussion using our clinic review tool. No additional management support is needed unless otherwise documented below in the visit note.  

## 2014-01-03 NOTE — Progress Notes (Signed)
Patient ID: Ellen Hamilton, female   DOB: 15-Jan-1951, 63 y.o.   MRN: [id number]   Patient Active Problem List   Diagnosis Date Noted  . Diaphoresis 01/05/2014  . Routine general medical examination at a health care facility 09/28/2012  . History of thyroid nodule 06/13/2012  . Benign colonic polyp 06/13/2012  . OSA on CPAP 06/06/2012  . Cerebrovascular disease 06/04/2012  . Hypertension 06/04/2012  . Hyperlipidemia 06/04/2012  . Osteoporosis 06/04/2012    Subjective:  CC:   Chief Complaint  Patient presents with  . Acute Visit    Not able to sleep all night due to head ache, Hot, Clammy patient felt like she was going to faint.. Patient had severe cramps.  . Dizziness    At this time patient feels weak and cold.    HPI:   Ellen Hamilton is a 63 y.o. female who presents for Sudden onset of bad headache and bilateral leg cramps.  woke her from sleep by 5:30 decided to remove CPAP and slept until 9 am. Had lower abdominal cramps followed by nausea and one episode of emesis.  Felt flushed,  diaphoretic , abdominal cramps,  dizZy . Had a very constipated BM of hard pellets today,.   [place] off the toilet. husband called for help,  The staff came and checked vital signs but couldn't get a BP reading , gave her gatorade  To drink which she sipped,  two small bottles total  By 11:00 am.  Ears also felt plugged up.  Yesterday had gone out to dinner for anniversary and ate [place] . Went to bed feeling ok   Currently her  headache is better but not gone.  Back aching due to missed stretching and PT sessions.    Past Medical History  Diagnosis Date  . Stroke     pt has had two  . Hypertension     History reviewed. No pertinent past surgical history.     The following portions of the patient's history were reviewed and updated as appropriate: Allergies, current medications, and problem list.    Review of Systems:   Patient denies headache, fevers, malaise, unintentional  weight loss, skin rash, eye pain, sinus congestion and sinus pain, sore throat, dysphagia,  hemoptysis , cough, dyspnea, wheezing, chest pain, palpitations, orthopnea, edema, abdominal pain, nausea, melena, diarrhea, constipation, flank pain, dysuria, hematuria, urinary  Frequency, nocturia, numbness, tingling, seizures,  Focal weakness, Loss of consciousness,  Tremor, insomnia, depression, anxiety, and suicidal ideation.     History   Social History  . Marital Status: Married    Spouse Name: N/A    Number of Children: N/A  . Years of Education: N/A   Occupational History  . Not on file.   Social History Main Topics  . Smoking status: Never Smoker   . Smokeless tobacco: Not on file  . Alcohol Use: Yes  . Drug Use: No  . Sexual Activity: Yes   Other Topics Concern  . Not on file   Social History Narrative  . No narrative on file    Objective:  Filed Vitals:   01/03/14 1551  BP: 120/72  Pulse: 57  Temp: 97.8 F (36.6 C)  Resp: 16     General appearance: alert, cooperative and appears stated age Ears: normal TM's and external ear canals both ears Throat: lips, mucosa, and tongue normal; teeth and gums normal Neck: no adenopathy, no carotid bruit, supple, symmetrical, trachea midline and thyroid not enlarged, symmetric, no tenderness/mass/nodules Back: symmetric,  no curvature. ROM normal. No CVA tenderness. Lungs: clear to auscultation bilaterally Heart: regular rate and rhythm, S1, S2 normal, no murmur, click, rub or gallop Abdomen: soft, non-tender; bowel sounds normal; no masses,  no organomegaly Pulses: 2+ and symmetric Skin: Skin color, texture, turgor normal. No rashes or lesions Lymph nodes: Cervical, supraclavicular, and axillary nodes normal.  Assessment and Plan:  Diaphoresis Self limiting episode accompanied by orthostasis . Occurred after eating a rich dinner and may have been gastrtis.  No evidence of UTI or AMI.  Advised to increase hydration.     Updated Medication List Outpatient Encounter Prescriptions as of 01/03/2014  Medication Sig  . amLODipine (NORVASC) 5 MG tablet Take 1 tablet (5 mg total) by mouth daily.  Marland Kitchen aspirin 81 MG tablet Take 81 mg by mouth daily.  . calcitonin, salmon, (MIACALCIN/FORTICAL) 200 UNIT/ACT nasal spray USE 1 SPRAY INTO THE NOSE DAILY, ALTERNATE NOSTRIL EVERY OTHER DAY  . CALCIUM CITRATE PO Take 1,500 mg by mouth 3 (three) times daily.  . cholecalciferol (VITAMIN D) 1000 UNITS tablet Take 1,000 Units by mouth daily.  . CRESTOR 40 MG tablet TAKE 1 TABLET DAILY  . dipyridamole (PERSANTINE) 50 MG tablet Take 4 tablets (200 mg total) by mouth 2 (two) times daily.  . fish oil-omega-3 fatty acids 1000 MG capsule Take 1,000 mg by mouth daily.  . metoprolol succinate (TOPROL-XL) 100 MG 24 hr tablet TAKE 1 TABLET DAILY  . metroNIDAZOLE (METROCREAM) 0.75 % cream Apply 1 application topically 2 (two) times daily.   . Multiple Vitamin (MULTIVITAMIN) tablet Take 1 tablet by mouth daily.  Marland Kitchen omeprazole (PRILOSEC) 40 MG capsule TAKE ONE CAPSULE BY MOUTH EVERY DAY  . phenazopyridine (PYRIDIUM) 200 MG tablet Take 1 tablet (200 mg total) by mouth 3 (three) times daily as needed for pain.  . vitamin B-12 (CYANOCOBALAMIN) 1000 MCG tablet Take 1,000 mcg by mouth every other day.  . Lactulose 20 GM/30ML SOLN Take 30 mLs (20 g total) by mouth daily. As needed to prevent constipation  . nystatin ointment (MYCOSTATIN) Apply topically 2 (two) times daily. Until resolved  . telmisartan-hydrochlorothiazide (MICARDIS HCT) 80-12.5 MG per tablet Take 1 tablet by mouth daily.     Orders Placed This Encounter  Procedures  . Urine Culture  . Urinalysis, Routine w reflex microscopic  . CBC with Differential  . Magnesium  . Comprehensive metabolic panel  . Cardiac panel  . Troponin I  . CK Total (and CKMB)  . Troponin I  . Basic metabolic panel  . POCT Urinalysis Dipstick    Return in about 1 week (around 01/10/2014).

## 2014-01-03 NOTE — Patient Instructions (Addendum)
You may be developing an infection ,  Either of the bowels or the bladder, and you are definitely dehydrated   Please rest,  Drink plenty of fluids and simplify your diet for the next few days   If you develop chest pain or shortness of breath,  Call 911 and go to the ER  I will call you with the results of your labs as soon as I get them and start antibiotics if indicated  I am prescribing lactulose to keep your bowels moving to prevent diverticulitis .  Use it once daily as needed to prevent constipation

## 2014-01-04 ENCOUNTER — Encounter: Payer: Self-pay | Admitting: Internal Medicine

## 2014-01-04 LAB — COMPREHENSIVE METABOLIC PANEL
ALBUMIN: 4.9 g/dL (ref 3.5–5.2)
ALT: 20 U/L (ref 0–35)
AST: 34 U/L (ref 0–37)
Alkaline Phosphatase: 58 U/L (ref 39–117)
BUN: 12 mg/dL (ref 6–23)
CALCIUM: 9.5 mg/dL (ref 8.4–10.5)
CO2: 26 meq/L (ref 19–32)
Chloride: 96 mEq/L (ref 96–112)
Creatinine, Ser: 0.6 mg/dL (ref 0.4–1.2)
GFR: 106.88 mL/min (ref >60.00)
Glucose, Bld: 94 mg/dL (ref 70–99)
Potassium: 4.5 mEq/L (ref 3.5–5.1)
SODIUM: 133 meq/L — AB (ref 135–145)
TOTAL PROTEIN: 8 g/dL (ref 6.0–8.3)
Total Bilirubin: 0.8 mg/dL (ref 0.2–1.2)

## 2014-01-04 LAB — URINALYSIS, ROUTINE W REFLEX MICROSCOPIC
Bilirubin Urine: NEGATIVE
HGB URINE DIPSTICK: NEGATIVE
Ketones, ur: NEGATIVE
Leukocytes, UA: NEGATIVE
NITRITE: NEGATIVE
RBC / HPF: NONE SEEN (ref 0–?)
Specific Gravity, Urine: 1.01 (ref 1.000–1.030)
TOTAL PROTEIN, URINE-UPE24: NEGATIVE
Urine Glucose: NEGATIVE
Urobilinogen, UA: 0.2 (ref 0.0–1.0)
pH: 6.5 (ref 5.0–8.0)

## 2014-01-04 LAB — MAGNESIUM: MAGNESIUM: 1.7 mg/dL (ref 1.5–2.5)

## 2014-01-04 LAB — CARDIAC PANEL
CK-MB: 5.4 ng/mL — ABNORMAL HIGH (ref 0.3–4.0)
Relative Index: 3.9 calc — ABNORMAL HIGH (ref 0.0–2.5)
Total CK: 138 U/L (ref 7–177)

## 2014-01-04 LAB — TROPONIN I: Troponin I: 0.01 ng/mL (ref <0.06)

## 2014-01-05 ENCOUNTER — Other Ambulatory Visit (INDEPENDENT_AMBULATORY_CARE_PROVIDER_SITE_OTHER): Payer: BC Managed Care – PPO

## 2014-01-05 ENCOUNTER — Encounter: Payer: Self-pay | Admitting: Internal Medicine

## 2014-01-05 DIAGNOSIS — R112 Nausea with vomiting, unspecified: Secondary | ICD-10-CM

## 2014-01-05 DIAGNOSIS — R61 Generalized hyperhidrosis: Secondary | ICD-10-CM | POA: Insufficient documentation

## 2014-01-05 LAB — BASIC METABOLIC PANEL
BUN: 8 mg/dL (ref 6–23)
CO2: 26 mEq/L (ref 19–32)
Calcium: 9.2 mg/dL (ref 8.4–10.5)
Chloride: 97 mEq/L (ref 96–112)
Creatinine, Ser: 0.7 mg/dL (ref 0.4–1.2)
GFR: 89.46 mL/min (ref >60.00)
Glucose, Bld: 105 mg/dL — ABNORMAL HIGH (ref 70–99)
Potassium: 3.9 mEq/L (ref 3.5–5.1)
Sodium: 131 mEq/L — ABNORMAL LOW (ref 135–145)

## 2014-01-05 LAB — CK TOTAL AND CKMB (NOT AT ARMC)
CK, MB: 4.1 ng/mL (ref 0.0–5.0)
RELATIVE INDEX: 3 (ref 0.0–4.0)
Total CK: 137 U/L (ref 7–177)

## 2014-01-05 LAB — CBC WITH DIFFERENTIAL/PLATELET
BASOS PCT: 0.2 % (ref 0.0–3.0)
Basophils Absolute: 0 10*3/uL (ref 0.0–0.1)
Eosinophils Absolute: 0.2 10*3/uL (ref 0.0–0.7)
Eosinophils Relative: 3.8 % (ref 0.0–5.0)
HCT: 40.5 % (ref 36.0–46.0)
HEMOGLOBIN: 13.5 g/dL (ref 12.0–15.0)
LYMPHS PCT: 15 % (ref 12.0–46.0)
Lymphs Abs: 0.9 10*3/uL (ref 0.7–4.0)
MCHC: 33.4 g/dL (ref 30.0–36.0)
MCV: 94.3 fl (ref 78.0–100.0)
MONO ABS: 0.5 10*3/uL (ref 0.1–1.0)
Monocytes Relative: 8.1 % (ref 3.0–12.0)
Neutro Abs: 4.4 10*3/uL (ref 1.4–7.7)
Neutrophils Relative %: 72.9 % (ref 43.0–77.0)
Platelets: 173 10*3/uL (ref 150.0–400.0)
RBC: 4.29 Mil/uL (ref 3.87–5.11)
RDW: 13.7 % (ref 11.5–15.5)
WBC: 6.1 10*3/uL (ref 4.0–10.5)

## 2014-01-05 LAB — URINE CULTURE

## 2014-01-05 LAB — TROPONIN I: Troponin I: 0.01 ng/mL (ref <0.06)

## 2014-01-05 NOTE — Assessment & Plan Note (Signed)
Self limiting episode accompanied by orthostasis . Occurred after eating a rich dinner and may have been gastrtis.  No evidence of UTI or AMI.  Advised to increase hydration.

## 2014-01-06 ENCOUNTER — Telehealth: Payer: Self-pay | Admitting: Internal Medicine

## 2014-01-06 ENCOUNTER — Encounter: Payer: Self-pay | Admitting: Internal Medicine

## 2014-01-06 MED ORDER — METHOCARBAMOL 500 MG PO TABS: 500.0000 mg | ORAL_TABLET | Freq: Four times a day (QID) | ORAL | Status: DC

## 2014-01-06 NOTE — Telephone Encounter (Signed)
Called to check on patient and husband stated that she is having severe back pain , patient pain is worse today tried the aleve and tylenol as directed, but feels pain is worse today. Please advise.

## 2014-01-06 NOTE — Telephone Encounter (Signed)
I called in a MR robaxin to pharmacy to take every 6 to 8 hours as needed for muscle spasm,.  They are sedating .  Also try a heating pad 15 minutes every few hours, and can also also try Salon Pas topical patches available OTC.  Wear for 8-12 hours

## 2014-01-06 NOTE — Telephone Encounter (Signed)
Patient notified and voiced understanding.

## 2014-01-09 ENCOUNTER — Encounter: Payer: Self-pay | Admitting: Internal Medicine

## 2014-01-09 DIAGNOSIS — M545 Low back pain, unspecified: Secondary | ICD-10-CM

## 2014-01-10 ENCOUNTER — Encounter: Payer: Self-pay | Admitting: Internal Medicine

## 2014-01-10 DIAGNOSIS — S32050A Wedge compression fracture of fifth lumbar vertebra, initial encounter for closed fracture: Secondary | ICD-10-CM

## 2014-01-10 HISTORY — DX: Wedge compression fracture of fifth lumbar vertebra, initial encounter for closed fracture: S32.050A

## 2014-01-11 ENCOUNTER — Telehealth: Payer: Self-pay | Admitting: Internal Medicine

## 2014-01-11 ENCOUNTER — Ambulatory Visit (INDEPENDENT_AMBULATORY_CARE_PROVIDER_SITE_OTHER)
Admission: RE | Admit: 2014-01-11 | Discharge: 2014-01-11 | Disposition: A | Discharge status: Home / Self Care | Payer: BC Managed Care – PPO | Source: Ambulatory Visit | Attending: Internal Medicine | Admitting: Internal Medicine

## 2014-01-11 ENCOUNTER — Other Ambulatory Visit: Payer: Self-pay | Admitting: Internal Medicine

## 2014-01-11 DIAGNOSIS — M545 Low back pain, unspecified: Secondary | ICD-10-CM

## 2014-01-11 MED ORDER — HYDROCODONE-ACETAMINOPHEN 5-325 MG PO TABS: 1.0000 | ORAL_TABLET | Freq: Four times a day (QID) | ORAL | Status: DC | PRN

## 2014-01-11 MED ORDER — TRAMADOL HCL 50 MG PO TABS: 50.0000 mg | ORAL_TABLET | Freq: Three times a day (TID) | ORAL | Status: DC | PRN

## 2014-01-11 NOTE — Telephone Encounter (Signed)
Patient husband called and stated patient still in Extreme pain in lower back that the muscle relaxer is not helping pain at 9 out 1- 10 scale patient wanted to know should she get an X-ray? Please advise /

## 2014-01-11 NOTE — Telephone Encounter (Signed)
Patient tnotified script placed at front desk. Patient pain is contained to lower back and sacrum area. Stated by patient.

## 2014-01-11 NOTE — Telephone Encounter (Signed)
Yes,  Please take her to [place] for the films  . And I will write her an rx for  vicodin 5/325 that she can use up to 3 daily while we figre out what is going on,  Is the pain radiating down either leg ?

## 2014-01-11 NOTE — Telephone Encounter (Signed)
Request for Pt change noted, and printed

## 2014-01-12 ENCOUNTER — Encounter: Payer: Self-pay | Admitting: Internal Medicine

## 2014-01-13 NOTE — Telephone Encounter (Signed)
FYI

## 2014-01-25 ENCOUNTER — Encounter: Payer: Self-pay | Admitting: Internal Medicine

## 2014-01-25 ENCOUNTER — Ambulatory Visit (INDEPENDENT_AMBULATORY_CARE_PROVIDER_SITE_OTHER): Payer: BC Managed Care – PPO | Admitting: Internal Medicine

## 2014-01-25 VITALS — BP 144/88 | HR 64 | Temp 98.4°F | Resp 16 | Ht 63.0 in | Wt 178.0 lb

## 2014-01-25 DIAGNOSIS — S32050D Wedge compression fracture of fifth lumbar vertebra, subsequent encounter for fracture with routine healing: Secondary | ICD-10-CM

## 2014-01-25 DIAGNOSIS — S32000A Wedge compression fracture of unspecified lumbar vertebra, initial encounter for closed fracture: Secondary | ICD-10-CM | POA: Insufficient documentation

## 2014-01-25 DIAGNOSIS — S32050G Wedge compression fracture of fifth lumbar vertebra, subsequent encounter for fracture with delayed healing: Secondary | ICD-10-CM

## 2014-01-25 DIAGNOSIS — IMO0002 Reserved for concepts with insufficient information to code with codable children: Secondary | ICD-10-CM

## 2014-01-25 DIAGNOSIS — S32050A Wedge compression fracture of fifth lumbar vertebra, initial encounter for closed fracture: Secondary | ICD-10-CM | POA: Insufficient documentation

## 2014-01-25 MED ORDER — TRAMADOL HCL 50 MG PO TABS: 100.0000 mg | ORAL_TABLET | Freq: Four times a day (QID) | ORAL | Status: DC

## 2014-01-25 MED ORDER — HYDROCODONE-ACETAMINOPHEN 5-325 MG PO TABS: 1.0000 | ORAL_TABLET | Freq: Four times a day (QID) | ORAL | Status: DC | PRN

## 2014-01-25 NOTE — Progress Notes (Signed)
Pre-visit discussion using our clinic review tool. No additional management support is needed unless otherwise documented below in the visit note.  

## 2014-01-25 NOTE — Assessment & Plan Note (Addendum)
Non traumatic, occuring 3 weeks ago during a twisting incident.  She has a history of osteoporosis and CVD with prior stroke. Her previous episodes of back pain from fractures were treated successfully with kyphoplasty.   I have increased her tramadol to up to 8 tablets daily, for pain control given her reluctance to use anything stronger due to past episodes of hypotension,  reportedly.   MRI lumbar spine ordered along with Neurosurgical consultation for consideration of kyphoplasty

## 2014-01-25 NOTE — Patient Instructions (Signed)
You can increase the tramadol dose to 2 tablets every 6 hours (total of 8 tablets in a 24 hr period) as needed for your back pain  If that increase does not control your pain you can add the hydrocodone  Up to 3 daily  MRI of lumbar spine is being ordered to determine if kyphoplasty may help

## 2014-01-25 NOTE — Progress Notes (Signed)
Patient ID: Ellen Hamilton, female   DOB: 08/18/1950, 63 y.o.   MRN: [id number]  Patient Active Problem List   Diagnosis Date Noted  . Compression fracture of L5 lumbar vertebra 01/25/2014  . Diaphoresis 01/05/2014  . Routine general medical examination at a health care facility 09/28/2012  . History of thyroid nodule 06/13/2012  . Benign colonic polyp 06/13/2012  . OSA on CPAP 06/06/2012  . Cerebrovascular disease 06/04/2012  . Hypertension 06/04/2012  . Hyperlipidemia 06/04/2012  . Osteoporosis 06/04/2012    Subjective:  CC:   Chief Complaint  Patient presents with  . Follow-up  . Back Pain    lower back down to sacral area patient in tears walking.    HPI:   Ellen Hamilton is a 63 y.o. female who presents for Follow up on low back pain secondary to nontraumatic L5 compression fracture which occurred 3 weeks ago during a twisting incident while sitting on the commode.  .  patient has been taking 50 mg tramadol every 8 hours and 500 mg tylenol in between doses with no improvement.  Her pain has is so severe she can barely stand or walk.  Her only comfortable position is supine.  prior compression fractures were treated with kyphoplasty several years ago and she is requesting definitive treatment if appropriate as she remembers having had immediate relief of pain.  Currently her pain is not radiating to either leg and is not complicated by any change in bowel or bladder habits .  No incontinence or numbness.     Past Medical History  Diagnosis Date  . Stroke     pt has had two  . Hypertension     No past surgical history on file.     The following portions of the patient's history were reviewed and updated as appropriate: Allergies, current medications, and problem list.    Review of Systems:   Patient denies headache, fevers, malaise, unintentional weight loss, skin rash, eye pain, sinus congestion and sinus pain, sore throat, dysphagia,  hemoptysis , cough,  dyspnea, wheezing, chest pain, palpitations, orthopnea, edema, abdominal pain, nausea, melena, diarrhea, constipation, flank pain, dysuria, hematuria, urinary  Frequency, nocturia, numbness, tingling, seizures,  Focal weakness, Loss of consciousness,  Tremor, insomnia, depression, anxiety, and suicidal ideation.     History   Social History  . Marital Status: Unknown    Spouse Name: N/A    Number of Children: N/A  . Years of Education: N/A   Occupational History  . Not on file.   Social History Main Topics  . Smoking status: Never Smoker   . Smokeless tobacco: Not on file  . Alcohol Use: Yes  . Drug Use: No  . Sexual Activity: Yes   Other Topics Concern  . Not on file   Social History Narrative  . No narrative on file    Objective:  Filed Vitals:   01/25/14 1105  BP: 144/88  Pulse: 64  Temp: 98.4 F (36.9 C)  Resp: 16     General appearance: alert, cooperative and appears stated age Ears: normal TM's and external ear canals both ears Throat: lips, mucosa, and tongue normal; teeth and gums normal Neck: no adenopathy, no carotid bruit, supple, symmetrical, trachea midline and thyroid not enlarged, symmetric, no tenderness/mass/nodules Back: symmetric, no curvature. ROM normal. No CVA tenderness. Lungs: clear to auscultation bilaterally Heart: regular rate and rhythm, S1, S2 normal, no murmur, click, rub or gallop Abdomen: soft, non-tender; bowel sounds normal; no masses,  no organomegaly  Pulses: 2+ and symmetric Skin: Skin color, texture, turgor normal. No rashes or lesions Lymph nodes: Cervical, supraclavicular, and axillary nodes normal.  Assessment and Plan:  Compression fracture of L5 lumbar vertebra Non traumatic, occuring 3 weeks ago during a twisting incident.  She has a history of osteoporosis and CVD with prior stroke. Her previous episodes of back pain from fractures were treated successfully with kyphoplasty.   I have increased her tramadol to up to 8  tablets daily, for pain control given her reluctance to use anything stronger due to past episodes of hypotension,  reportedly.   MRI lumbar spine ordered along with Neurosurgical consultation for consideration of kyphoplasty    Updated Medication List Outpatient Encounter Prescriptions as of 01/25/2014  Medication Sig  . amLODipine (NORVASC) 5 MG tablet Take 1 tablet (5 mg total) by mouth daily.  Marland Kitchen aspirin 81 MG tablet Take 81 mg by mouth daily.  . calcitonin, salmon, (MIACALCIN/FORTICAL) 200 UNIT/ACT nasal spray USE 1 SPRAY INTO THE NOSE DAILY, ALTERNATE NOSTRIL EVERY OTHER DAY  . CALCIUM CITRATE PO Take 1,500 mg by mouth 3 (three) times daily.  . cholecalciferol (VITAMIN D) 1000 UNITS tablet Take 1,000 Units by mouth daily.  . CRESTOR 40 MG tablet TAKE 1 TABLET DAILY  . dipyridamole (PERSANTINE) 50 MG tablet Take 4 tablets (200 mg total) by mouth 2 (two) times daily.  . fish oil-omega-3 fatty acids 1000 MG capsule Take 1,000 mg by mouth daily.  . Lactulose 20 GM/30ML SOLN Take 30 mLs (20 g total) by mouth daily. As needed to prevent constipation  . methocarbamol (ROBAXIN) 500 MG tablet Take 1 tablet (500 mg total) by mouth 4 (four) times daily.  . metoprolol succinate (TOPROL-XL) 100 MG 24 hr tablet TAKE 1 TABLET DAILY  . metroNIDAZOLE (METROCREAM) 0.75 % cream Apply 1 application topically 2 (two) times daily.   . Multiple Vitamin (MULTIVITAMIN) tablet Take 1 tablet by mouth daily.  Marland Kitchen nystatin ointment (MYCOSTATIN) Apply topically 2 (two) times daily. Until resolved  . omeprazole (PRILOSEC) 40 MG capsule TAKE ONE CAPSULE BY MOUTH EVERY DAY  . telmisartan-hydrochlorothiazide (MICARDIS HCT) 80-12.5 MG per tablet Take 1 tablet by mouth daily.  . traMADol (ULTRAM) 50 MG tablet Take 1 tablet (50 mg total) by mouth every 8 (eight) hours as needed.  . vitamin B-12 (CYANOCOBALAMIN) 1000 MCG tablet Take 1,000 mcg by mouth every other day.  Marland Kitchen HYDROcodone-acetaminophen (NORCO/VICODIN) 5-325 MG per  tablet Take 1 tablet by mouth every 6 (six) hours as needed for moderate pain.  . phenazopyridine (PYRIDIUM) 200 MG tablet Take 1 tablet (200 mg total) by mouth 3 (three) times daily as needed for pain.  . traMADol (ULTRAM) 50 MG tablet Take 2 tablets (100 mg total) by mouth 4 (four) times daily. For vertebral fracture     Orders Placed This Encounter  Procedures  . MR Lumbar Spine Wo Contrast  . Ambulatory referral to Neurosurgery    No Follow-up on file.

## 2014-01-26 ENCOUNTER — Encounter: Payer: Self-pay | Admitting: Internal Medicine

## 2014-01-27 ENCOUNTER — Ambulatory Visit: Payer: Self-pay | Admitting: Internal Medicine

## 2014-01-29 ENCOUNTER — Telehealth: Payer: Self-pay | Admitting: Internal Medicine

## 2014-01-29 DIAGNOSIS — S32050S Wedge compression fracture of fifth lumbar vertebra, sequela: Secondary | ICD-10-CM

## 2014-01-29 NOTE — Assessment & Plan Note (Signed)
505 by MRI.  With lateral recess stenosis.

## 2014-01-31 ENCOUNTER — Ambulatory Visit: Payer: Self-pay | Admitting: Internal Medicine

## 2014-01-31 NOTE — Telephone Encounter (Signed)
The patient is scheduled with Dr.Jenkins for this Friday, Sept 25th at 2:30.  She was contacted and has all apt information.

## 2014-01-31 NOTE — Telephone Encounter (Signed)
Thank you Caroline!

## 2014-01-31 NOTE — Telephone Encounter (Signed)
FYI

## 2014-01-31 NOTE — Telephone Encounter (Signed)
Was this ordered as High priority referral? Called patient an advised this has been forwarded to the referral person  and to Dr. Derrel Nip.

## 2014-01-31 NOTE — Telephone Encounter (Signed)
Yes it was.,  See referral note

## 2014-02-02 ENCOUNTER — Encounter: Payer: Self-pay | Admitting: Internal Medicine

## 2014-02-06 ENCOUNTER — Telehealth: Payer: Self-pay | Admitting: Internal Medicine

## 2014-02-06 ENCOUNTER — Other Ambulatory Visit: Payer: Self-pay | Admitting: Internal Medicine

## 2014-02-06 ENCOUNTER — Encounter: Payer: Self-pay | Admitting: Internal Medicine

## 2014-02-06 DIAGNOSIS — M8008XS Age-related osteoporosis with current pathological fracture, vertebra(e), sequela: Secondary | ICD-10-CM

## 2014-02-07 ENCOUNTER — Other Ambulatory Visit: Payer: Self-pay | Admitting: Neurosurgery

## 2014-02-08 ENCOUNTER — Other Ambulatory Visit: Payer: Self-pay | Admitting: Internal Medicine

## 2014-02-08 ENCOUNTER — Encounter (HOSPITAL_COMMUNITY): Payer: Self-pay | Admitting: Pharmacy Technician

## 2014-02-09 ENCOUNTER — Encounter: Payer: Self-pay | Admitting: Internal Medicine

## 2014-02-09 NOTE — Pre-Procedure Instructions (Signed)
Ellen Hamilton  02/09/2014   Your procedure is scheduled on:  Mon, Oct 5 @ 10:45 AM  Report to [facility]  at 8:45 AM.  Call this number if you have problems the morning of surgery: [phone]   Remember:   Do not eat food or drink liquids after midnight.   Take these medicines the morning of surgery with A SIP OF WATER: Amlodipine(Norvac),Pain Pill(if needed),Metoprolol(Toprol),and Omeprazole(Prilosec)               Stop taking your Aspirin and Fish Oil. No Goody's,BC's,Aleve,Ibuprofen,or any Herbal Medications.  Do not wear jewelry, make-up or nail polish.  Do not wear lotions, powders, or perfumes. You may wear deodorant.  Do not shave 48 hours prior to surgery.   Do not bring valuables to the hospital.  [facility] is not responsible                  for any belongings or valuables.               Contacts, dentures or bridgework may not be worn into surgery.  Leave suitcase in the car. After surgery it may be brought to your room.  For patients admitted to the hospital, discharge time is determined by your                treatment team.               Patients discharged the day of surgery will not be allowed to drive  home.    Special Instructions:  [facility] [facility] - Preparing for Surgery  Before surgery, you can play an important role.  Because skin is not sterile, your skin needs to be as free of germs as possible.  You can reduce the number of germs on you skin by washing with CHG (chlorahexidine gluconate) soap before surgery.  CHG is an antiseptic cleaner which kills germs and bonds with the skin to continue killing germs even after washing.  Please DO NOT use if you have an allergy to CHG or antibacterial soaps.  If your skin becomes reddened/irritated stop using the CHG and inform your nurse when you arrive at Short Stay.  Do not shave (including legs and underarms) for at least 48 hours prior to the first CHG shower.  You may shave your face.  Please follow  these instructions carefully:   1.  Shower with CHG Soap the night before surgery and the                                morning of Surgery.  2.  If you choose to wash your hair, wash your hair first as usual with your       normal shampoo.  3.  After you shampoo, rinse your hair and body thoroughly to remove the                      Shampoo.  4.  Use CHG as you would any other liquid soap.  You can apply chg directly       to the skin and wash gently with scrungie or a clean washcloth.  5.  Apply the CHG Soap to your body ONLY FROM THE NECK DOWN.        Do not use on open wounds or open sores.  Avoid contact with your eyes,  ears, mouth and genitals (private parts).  Wash genitals (private parts)       with your normal soap.  6.  Wash thoroughly, paying special attention to the area where your surgery        will be performed.  7.  Thoroughly rinse your body with warm water from the neck down.  8.  DO NOT shower/wash with your normal soap after using and rinsing off       the CHG Soap.  9.  Pat yourself dry with a clean towel.            10.  Wear clean pajamas.            11.  Place clean sheets on your bed the night of your first shower and do not        sleep with pets.  Day of Surgery  Do not apply any lotions/deoderants the morning of surgery.  Please wear clean clothes to the hospital/surgery center.     Please read over the following fact sheets that you were given: Pain Booklet, Coughing and Deep Breathing, MRSA Information and Surgical Site Infection Prevention

## 2014-02-10 ENCOUNTER — Ambulatory Visit (HOSPITAL_COMMUNITY)
Admission: RE | Admit: 2014-02-10 | Discharge: 2014-02-10 | Disposition: A | Discharge status: Home / Self Care | Payer: BC Managed Care – PPO | Source: Ambulatory Visit | Attending: Anesthesiology | Admitting: Anesthesiology

## 2014-02-10 ENCOUNTER — Encounter (HOSPITAL_COMMUNITY)
Admission: RE | Admit: 2014-02-10 | Discharge: 2014-02-10 | Disposition: A | Discharge status: Home / Self Care | Payer: BC Managed Care – PPO | Source: Ambulatory Visit | Attending: Neurosurgery | Admitting: Neurosurgery

## 2014-02-10 ENCOUNTER — Encounter (HOSPITAL_COMMUNITY): Payer: Self-pay

## 2014-02-10 DIAGNOSIS — I10 Essential (primary) hypertension: Secondary | ICD-10-CM | POA: Diagnosis not present

## 2014-02-10 DIAGNOSIS — M8088XA Other osteoporosis with current pathological fracture, vertebra(e), initial encounter for fracture: Secondary | ICD-10-CM | POA: Insufficient documentation

## 2014-02-10 DIAGNOSIS — Z9889 Other specified postprocedural states: Secondary | ICD-10-CM | POA: Diagnosis not present

## 2014-02-10 DIAGNOSIS — Z01812 Encounter for preprocedural laboratory examination: Secondary | ICD-10-CM | POA: Diagnosis not present

## 2014-02-10 DIAGNOSIS — Z01810 Encounter for preprocedural cardiovascular examination: Secondary | ICD-10-CM | POA: Insufficient documentation

## 2014-02-10 HISTORY — DX: Wedge compression fracture of fifth lumbar vertebra, initial encounter for closed fracture: S32.050A

## 2014-02-10 HISTORY — DX: Gastro-esophageal reflux disease without esophagitis: K21.9

## 2014-02-10 HISTORY — DX: Hyperlipidemia, unspecified: E78.5

## 2014-02-10 HISTORY — DX: Fibromyalgia: M79.7

## 2014-02-10 HISTORY — DX: Personal history of transient ischemic attack (TIA), and cerebral infarction without residual deficits: Z86.73

## 2014-02-10 HISTORY — DX: Sleep apnea, unspecified: G47.30

## 2014-02-10 HISTORY — DX: Age-related osteoporosis without current pathological fracture: M81.0

## 2014-02-10 LAB — CBC WITH DIFFERENTIAL/PLATELET
BASOS ABS: 0 10*3/uL (ref 0.0–0.1)
Basophils Relative: 1 % (ref 0–1)
EOS PCT: 5 % (ref 0–5)
Eosinophils Absolute: 0.2 10*3/uL (ref 0.0–0.7)
HCT: 43.1 % (ref 36.0–46.0)
Hemoglobin: 14.6 g/dL (ref 12.0–15.0)
LYMPHS ABS: 1.5 10*3/uL (ref 0.7–4.0)
LYMPHS PCT: 29 % (ref 12–46)
MCH: 31 pg (ref 26.0–34.0)
MCHC: 33.9 g/dL (ref 30.0–36.0)
MCV: 91.5 fL (ref 78.0–100.0)
Monocytes Absolute: 0.4 10*3/uL (ref 0.1–1.0)
Monocytes Relative: 8 % (ref 3–12)
NEUTROS ABS: 2.9 10*3/uL (ref 1.7–7.7)
Neutrophils Relative %: 57 % (ref 43–77)
Platelets: 184 10*3/uL (ref 150–400)
RBC: 4.71 MIL/uL (ref 3.87–5.11)
RDW: 12.9 % (ref 11.5–15.5)
WBC: 5 10*3/uL (ref 4.0–10.5)

## 2014-02-10 LAB — SURGICAL PCR SCREEN
MRSA, PCR: NEGATIVE
Staphylococcus aureus: NEGATIVE

## 2014-02-10 LAB — BASIC METABOLIC PANEL
ANION GAP: 11 (ref 5–15)
BUN: 13 mg/dL (ref 6–23)
CALCIUM: 9.7 mg/dL (ref 8.4–10.5)
CO2: 28 meq/L (ref 19–32)
Chloride: 94 mEq/L — ABNORMAL LOW (ref 96–112)
Creatinine, Ser: 0.81 mg/dL (ref 0.50–1.10)
GFR calc Af Amer: 87 mL/min — ABNORMAL LOW (ref >90)
GFR calc non Af Amer: 75 mL/min — ABNORMAL LOW (ref >90)
GLUCOSE: 89 mg/dL (ref 70–99)
POTASSIUM: 4.5 meq/L (ref 3.7–5.3)
SODIUM: 133 meq/L — AB (ref 137–147)

## 2014-02-10 NOTE — Progress Notes (Signed)
Pt notified of surgery time change, instructed pt to be here at 5:45 AM. Pt voiced understanding.

## 2014-02-12 MED ORDER — VANCOMYCIN HCL IN DEXTROSE 1-5 GM/200ML-% IV SOLN
1000.0000 mg | INTRAVENOUS | Status: AC
  Administered 2014-02-13: 1000 mg via INTRAVENOUS
  Filled 2014-02-12: qty 200

## 2014-02-12 MED ORDER — DEXAMETHASONE SODIUM PHOSPHATE 10 MG/ML IJ SOLN
10.0000 mg | INTRAMUSCULAR | Status: AC
  Administered 2014-02-13: 10 mg via INTRAVENOUS
  Filled 2014-02-12: qty 1

## 2014-02-12 NOTE — Anesthesia Preprocedure Evaluation (Addendum)
Anesthesia Evaluation  Patient identified by MRN, date of birth, ID band Patient awake    Reviewed: Allergy & Precautions, H&P , NPO status   Airway Mallampati: II TM Distance: >3 FB     Dental  (+) Teeth Intact, Dental Advisory Given   Pulmonary sleep apnea ,  breath sounds clear to auscultation        Cardiovascular hypertension, Pt. on medications Rhythm:Regular Rate:Normal     Neuro/Psych TIA   GI/Hepatic GERD-  Medicated,  Endo/Other    Renal/GU      Musculoskeletal   Abdominal (+)  Abdomen: soft.    Peds  Hematology   Anesthesia Other Findings   Reproductive/Obstetrics                         Anesthesia Physical Anesthesia Plan  ASA: III  Anesthesia Plan: General   Post-op Pain Management:    Induction: Intravenous  Airway Management Planned: Oral ETT  Additional Equipment:   Intra-op Plan:   Post-operative Plan: Extubation in OR  Informed Consent: I have reviewed the patients History and Physical, chart, labs and discussed the procedure including the risks, benefits and alternatives for the proposed anesthesia with the patient or authorized representative who has indicated his/her understanding and acceptance.     Plan Discussed with:   Anesthesia Plan Comments:         Anesthesia Quick Evaluation

## 2014-02-13 ENCOUNTER — Encounter (HOSPITAL_COMMUNITY): Payer: BC Managed Care – PPO | Admitting: Anesthesiology

## 2014-02-13 ENCOUNTER — Ambulatory Visit (HOSPITAL_COMMUNITY): Payer: BC Managed Care – PPO

## 2014-02-13 ENCOUNTER — Encounter (HOSPITAL_COMMUNITY)
Admission: RE | Disposition: A | Discharge status: Home / Self Care | Payer: Self-pay | Source: Ambulatory Visit | Attending: Neurosurgery

## 2014-02-13 ENCOUNTER — Telehealth: Payer: Self-pay | Admitting: Internal Medicine

## 2014-02-13 ENCOUNTER — Ambulatory Visit (HOSPITAL_COMMUNITY): Payer: BC Managed Care – PPO | Admitting: Anesthesiology

## 2014-02-13 ENCOUNTER — Encounter (HOSPITAL_COMMUNITY): Payer: Self-pay | Admitting: *Deleted

## 2014-02-13 ENCOUNTER — Ambulatory Visit (HOSPITAL_COMMUNITY)
Admission: RE | Admit: 2014-02-13 | Discharge: 2014-02-13 | Disposition: A | Discharge status: Home / Self Care | Payer: BC Managed Care – PPO | Source: Ambulatory Visit | Attending: Neurosurgery | Admitting: Neurosurgery

## 2014-02-13 DIAGNOSIS — Z880 Allergy status to penicillin: Secondary | ICD-10-CM | POA: Diagnosis not present

## 2014-02-13 DIAGNOSIS — M8000XA Age-related osteoporosis with current pathological fracture, unspecified site, initial encounter for fracture: Secondary | ICD-10-CM | POA: Diagnosis present

## 2014-02-13 DIAGNOSIS — M797 Fibromyalgia: Secondary | ICD-10-CM | POA: Insufficient documentation

## 2014-02-13 DIAGNOSIS — Z888 Allergy status to other drugs, medicaments and biological substances status: Secondary | ICD-10-CM | POA: Diagnosis not present

## 2014-02-13 DIAGNOSIS — Z8673 Personal history of transient ischemic attack (TIA), and cerebral infarction without residual deficits: Secondary | ICD-10-CM | POA: Insufficient documentation

## 2014-02-13 DIAGNOSIS — E785 Hyperlipidemia, unspecified: Secondary | ICD-10-CM | POA: Insufficient documentation

## 2014-02-13 DIAGNOSIS — Z885 Allergy status to narcotic agent status: Secondary | ICD-10-CM | POA: Insufficient documentation

## 2014-02-13 DIAGNOSIS — I10 Essential (primary) hypertension: Secondary | ICD-10-CM | POA: Diagnosis not present

## 2014-02-13 DIAGNOSIS — G4730 Sleep apnea, unspecified: Secondary | ICD-10-CM | POA: Insufficient documentation

## 2014-02-13 DIAGNOSIS — K219 Gastro-esophageal reflux disease without esophagitis: Secondary | ICD-10-CM | POA: Insufficient documentation

## 2014-02-13 DIAGNOSIS — S32000A Wedge compression fracture of unspecified lumbar vertebra, initial encounter for closed fracture: Secondary | ICD-10-CM | POA: Diagnosis present

## 2014-02-13 HISTORY — PX: KYPHOPLASTY: SHX5884

## 2014-02-13 SURGERY — KYPHOPLASTY
Anesthesia: General

## 2014-02-13 MED ORDER — IOHEXOL 300 MG/ML  SOLN
INTRAMUSCULAR | Status: DC | PRN
  Administered 2014-02-13: 50 mL

## 2014-02-13 MED ORDER — 0.9 % SODIUM CHLORIDE (POUR BTL) OPTIME
TOPICAL | Status: DC | PRN
  Administered 2014-02-13 (×2): 1000 mL

## 2014-02-13 MED ORDER — ONDANSETRON HCL 4 MG/2ML IJ SOLN
INTRAMUSCULAR | Status: AC
Start: 2014-02-13 — End: 2014-02-13
  Filled 2014-02-13: qty 2

## 2014-02-13 MED ORDER — HYDROCODONE-ACETAMINOPHEN 5-325 MG PO TABS: 1.0000 | ORAL_TABLET | ORAL | Status: DC | PRN

## 2014-02-13 MED ORDER — FENTANYL CITRATE 0.05 MG/ML IJ SOLN
INTRAMUSCULAR | Status: AC
  Filled 2014-02-13: qty 5

## 2014-02-13 MED ORDER — NEOSTIGMINE METHYLSULFATE 10 MG/10ML IV SOLN
INTRAVENOUS | Status: DC | PRN
  Administered 2014-02-13: 3 mg via INTRAVENOUS

## 2014-02-13 MED ORDER — MIDAZOLAM HCL 2 MG/2ML IJ SOLN
INTRAMUSCULAR | Status: AC
  Filled 2014-02-13: qty 2

## 2014-02-13 MED ORDER — LIDOCAINE HCL (CARDIAC) 20 MG/ML IV SOLN
INTRAVENOUS | Status: DC | PRN
  Administered 2014-02-13: 60 mg via INTRAVENOUS

## 2014-02-13 MED ORDER — PROPOFOL 10 MG/ML IV BOLUS
INTRAVENOUS | Status: AC
Start: 2014-02-13 — End: 2014-02-13
  Filled 2014-02-13: qty 20

## 2014-02-13 MED ORDER — LACTATED RINGERS IV SOLN
INTRAVENOUS | Status: DC | PRN
  Administered 2014-02-13: 07:00:00 via INTRAVENOUS

## 2014-02-13 MED ORDER — PROMETHAZINE HCL 25 MG/ML IJ SOLN: 6.2500 mg | INTRAMUSCULAR | Status: DC | PRN

## 2014-02-13 MED ORDER — LIDOCAINE HCL (CARDIAC) 20 MG/ML IV SOLN
INTRAVENOUS | Status: AC
  Filled 2014-02-13: qty 5

## 2014-02-13 MED ORDER — BUPIVACAINE HCL (PF) 0.25 % IJ SOLN
INTRAMUSCULAR | Status: DC | PRN
  Administered 2014-02-13: 10 mL

## 2014-02-13 MED ORDER — PROPOFOL 10 MG/ML IV BOLUS
INTRAVENOUS | Status: DC | PRN
  Administered 2014-02-13: 150 mg via INTRAVENOUS

## 2014-02-13 MED ORDER — GLYCOPYRROLATE 0.2 MG/ML IJ SOLN
INTRAMUSCULAR | Status: DC | PRN
  Administered 2014-02-13: 0.4 mg via INTRAVENOUS

## 2014-02-13 MED ORDER — FENTANYL CITRATE 0.05 MG/ML IJ SOLN: 25.0000 ug | INTRAMUSCULAR | Status: DC | PRN

## 2014-02-13 MED ORDER — ROCURONIUM BROMIDE 50 MG/5ML IV SOLN
INTRAVENOUS | Status: AC
Start: 2014-02-13 — End: 2014-02-13
  Filled 2014-02-13: qty 1

## 2014-02-13 MED ORDER — MEPERIDINE HCL 25 MG/ML IJ SOLN: 6.2500 mg | INTRAMUSCULAR | Status: DC | PRN

## 2014-02-13 MED ORDER — FENTANYL CITRATE 0.05 MG/ML IJ SOLN
INTRAMUSCULAR | Status: DC | PRN
  Administered 2014-02-13: 50 ug via INTRAVENOUS
  Administered 2014-02-13 (×2): 25 ug via INTRAVENOUS
  Administered 2014-02-13: 50 ug via INTRAVENOUS

## 2014-02-13 MED ORDER — GLYCOPYRROLATE 0.2 MG/ML IJ SOLN
INTRAMUSCULAR | Status: AC
  Filled 2014-02-13: qty 3

## 2014-02-13 MED ORDER — ACETAMINOPHEN 10 MG/ML IV SOLN
INTRAVENOUS | Status: AC
  Administered 2014-02-13: 1000 mg via INTRAVENOUS
  Filled 2014-02-13: qty 100

## 2014-02-13 MED ORDER — MIDAZOLAM HCL 5 MG/5ML IJ SOLN
INTRAMUSCULAR | Status: DC | PRN
  Administered 2014-02-13 (×2): 1 mg via INTRAVENOUS

## 2014-02-13 MED ORDER — ONDANSETRON HCL 4 MG/2ML IJ SOLN
INTRAMUSCULAR | Status: DC | PRN
  Administered 2014-02-13: 4 mg via INTRAVENOUS

## 2014-02-13 MED ORDER — ROCURONIUM BROMIDE 100 MG/10ML IV SOLN
INTRAVENOUS | Status: DC | PRN
  Administered 2014-02-13: 25 mg via INTRAVENOUS

## 2014-02-13 MED ORDER — NEOSTIGMINE METHYLSULFATE 10 MG/10ML IV SOLN
INTRAVENOUS | Status: AC
  Filled 2014-02-13: qty 1

## 2014-02-13 SURGICAL SUPPLY — 44 items
BANDAGE ADH SHEER 1  50/CT (GAUZE/BANDAGES/DRESSINGS) ×3 IMPLANT
BLADE CLIPPER SURG (BLADE) IMPLANT
BLADE SURG 15 STRL LF DISP TIS (BLADE) ×1 IMPLANT
BLADE SURG 15 STRL SS (BLADE) ×2
CEMENT BONE KYPHX HV R (Orthopedic Implant) ×3 IMPLANT
CEMENT KYPHON C01A KIT/MIXER (Cement) ×3 IMPLANT
CONT SPEC 4OZ CLIKSEAL STRL BL (MISCELLANEOUS) ×3 IMPLANT
DERMABOND ADVANCED (GAUZE/BANDAGES/DRESSINGS) ×2
DERMABOND ADVANCED .7 DNX12 (GAUZE/BANDAGES/DRESSINGS) ×1 IMPLANT
DEVICE BIOPSY BONE KYPHX (INSTRUMENTS) ×3 IMPLANT
DRAPE C-ARM 42X72 X-RAY (DRAPES) ×6 IMPLANT
DRAPE INCISE IOBAN 66X45 STRL (DRAPES) ×3 IMPLANT
DRAPE LAPAROTOMY 100X72X124 (DRAPES) ×3 IMPLANT
DRAPE PROXIMA HALF (DRAPES) ×3 IMPLANT
DRSG TELFA 3X8 NADH (GAUZE/BANDAGES/DRESSINGS) IMPLANT
DURAPREP 26ML APPLICATOR (WOUND CARE) ×3 IMPLANT
GAUZE SPONGE 4X4 16PLY XRAY LF (GAUZE/BANDAGES/DRESSINGS) ×3 IMPLANT
GLOVE BIOGEL PI IND STRL 7.0 (GLOVE) ×1 IMPLANT
GLOVE BIOGEL PI INDICATOR 7.0 (GLOVE) ×2
GLOVE ECLIPSE 9.0 STRL (GLOVE) ×3 IMPLANT
GLOVE EXAM NITRILE LRG STRL (GLOVE) IMPLANT
GLOVE EXAM NITRILE MD LF STRL (GLOVE) IMPLANT
GLOVE EXAM NITRILE XL STR (GLOVE) IMPLANT
GLOVE EXAM NITRILE XS STR PU (GLOVE) IMPLANT
GLOVE SS BIOGEL STRL SZ 6.5 (GLOVE) ×2 IMPLANT
GLOVE SUPERSENSE BIOGEL SZ 6.5 (GLOVE) ×4
GOWN STRL REUS W/ TWL LRG LVL3 (GOWN DISPOSABLE) ×1 IMPLANT
GOWN STRL REUS W/ TWL XL LVL3 (GOWN DISPOSABLE) ×1 IMPLANT
GOWN STRL REUS W/TWL 2XL LVL3 (GOWN DISPOSABLE) IMPLANT
GOWN STRL REUS W/TWL LRG LVL3 (GOWN DISPOSABLE) ×2
GOWN STRL REUS W/TWL XL LVL3 (GOWN DISPOSABLE) ×2
KIT BASIN OR (CUSTOM PROCEDURE TRAY) ×3 IMPLANT
KIT ROOM TURNOVER OR (KITS) ×3 IMPLANT
NEEDLE HYPO 25X1 1.5 SAFETY (NEEDLE) ×3 IMPLANT
NS IRRIG 1000ML POUR BTL (IV SOLUTION) ×6 IMPLANT
PACK EENT II TURBAN DRAPE (CUSTOM PROCEDURE TRAY) ×3 IMPLANT
PAD ARMBOARD 7.5X6 YLW CONV (MISCELLANEOUS) ×3 IMPLANT
STAPLER SKIN PROX WIDE 3.9 (STAPLE) IMPLANT
SUT VIC AB 2-0 CP2 18 (SUTURE) IMPLANT
SUT VIC AB 3-0 SH 8-18 (SUTURE) ×3 IMPLANT
TOWEL OR 17X24 6PK STRL BLUE (TOWEL DISPOSABLE) ×3 IMPLANT
TOWEL OR 17X26 10 PK STRL BLUE (TOWEL DISPOSABLE) ×3 IMPLANT
TRAY KYPHOPAK 15/3 ONESTEP 1ST (MISCELLANEOUS) ×3 IMPLANT
TRAY KYPHOPAK 20/3 ONESTEP 1ST (MISCELLANEOUS) IMPLANT

## 2014-02-13 NOTE — Brief Op Note (Signed)
02/13/2014  8:53 AM  PATIENT:  Ellen Hamilton  63 y.o. female  PRE-OPERATIVE DIAGNOSIS:  Lumbar Five Compression Fracture  POST-OPERATIVE DIAGNOSIS:  Lumbar Five Compression Fracture  PROCEDURE:  Procedure(s): KYPHOPLASTY LUMBAR FIVE (N/A)  SURGEON:  Surgeon(s) and Role:    * Charlie Pitter, MD - Primary  PHYSICIAN ASSISTANT:   ASSISTANTS:    ANESTHESIA:   general  EBL:     BLOOD ADMINISTERED:none  DRAINS: none   LOCAL MEDICATIONS USED:  MARCAINE     SPECIMEN:  No Specimen  DISPOSITION OF SPECIMEN:  N/A  COUNTS:  YES  TOURNIQUET:  * No tourniquets in log *  DICTATION: .Dragon Dictation  PLAN OF CARE: Discharge to home after PACU  PATIENT DISPOSITION:  PACU - hemodynamically stable.   Delay start of Pharmacological VTE agent (>24hrs) due to surgical blood loss or risk of bleeding: yes

## 2014-02-13 NOTE — Discharge Instructions (Signed)
What to eat: ° °For your first meals, you should eat lightly; only small meals initially.  If you do not have nausea, you may eat larger meals.  Avoid spicy, greasy and heavy food.   ° °General Anesthesia, Adult, Care After  °Refer to this sheet in the next few weeks. These instructions provide you with information on caring for yourself after your procedure. Your health care provider may also give you more specific instructions. Your treatment has been planned according to current medical practices, but problems sometimes occur. Call your health care provider if you have any problems or questions after your procedure.  °WHAT TO EXPECT AFTER THE PROCEDURE  °After the procedure, it is typical to experience:  °Sleepiness.  °Nausea and vomiting. °HOME CARE INSTRUCTIONS  °For the first 24 hours after general anesthesia:  °Have a responsible person with you.  °Do not drive a car. If you are alone, do not take public transportation.  °Do not drink alcohol.  °Do not take medicine that has not been prescribed by your health care provider.  °Do not sign important papers or make important decisions.  °You may resume a normal diet and activities as directed by your health care provider.  °Change bandages (dressings) as directed.  °If you have questions or problems that seem related to general anesthesia, call the hospital and ask for the anesthetist or anesthesiologist on call. °SEEK MEDICAL CARE IF:  °You have nausea and vomiting that continue the day after anesthesia.  °You develop a rash. °SEEK IMMEDIATE MEDICAL CARE IF:  °You have difficulty breathing.  °You have chest pain.  °You have any allergic problems. °Document Released: 08/04/2000 Document Revised: 12/29/2012 Document Reviewed: 11/11/2012  °ExitCare® Patient Information ©2014 [employer].  ° °Tissue Adhesive Wound Care  ° ° °Some cuts and wounds can be closed with tissue adhesive. Adhesive is like glue. It holds the skin together and helps a wound heal faster.  This adhesive goes away on its own as the wound heals.  °HOME CARE  °Showers are allowed. Do not soak the wound in water. Do not take baths, swim, or use hot tubs. Do not use soaps or creams on your wound.  °If a bandage (dressing) was put on, change it as often as told by your doctor.  °Keep the bandage dry.  °Do not scratch, pick, or rub the adhesive.  °Do not put tape over the adhesive. The adhesive could come off.  °Protect the wound from another injury.  °Protect the wound from sun and tanning beds.  °Only take medicine as told by your doctor.  °Keep all doctor visits as told. °GET HELP RIGHT AWAY IF:  °Your wound is red, puffy (swollen), hot, or tender.  °You get a rash after the glue is put on.  °You have more pain in the wound.  °You have a red streak going away from the wound.  °You have yellowish-white fluid (pus) coming from the wound.  °You have more bleeding.  °You have a fever.  °You have chills and start to shake.  °You notice a bad smell coming from the wound.  °Your wound or adhesive breaks open. °MAKE SURE YOU:  °Understand these instructions.  °Will watch your condition.  °Will get help right away if you are not doing well or get worse. °Document Released: 02/05/2008 Document Revised: 02/16/2013 Document Reviewed: 11/17/2012  °ExitCare® Patient Information ©2015 [employer]. This information is not intended to replace advice given to you by your health care provider.   Make sure you discuss any questions you have with your health care provider.  ° ° °

## 2014-02-13 NOTE — Anesthesia Procedure Notes (Signed)
Procedure Name: Intubation Date/Time: 02/13/2014 7:56 AM Performed by: Octavio Graves Pre-anesthesia Checklist: Patient identified, Timeout performed, Emergency Drugs available, Suction available and Patient being monitored Patient Re-evaluated:Patient Re-evaluated prior to inductionOxygen Delivery Method: Circle system utilized Preoxygenation: Pre-oxygenation with 100% oxygen Intubation Type: IV induction Ventilation: Mask ventilation without difficulty Laryngoscope Size: Miller and 2 Grade View: Grade I Tube type: Oral Tube size: 7.0 mm Number of attempts: 1 Airway Equipment and Method: Stylet and LTA kit utilized Placement Confirmation: ETT inserted through vocal cords under direct vision,  positive ETCO2 and breath sounds checked- equal and bilateral Secured at: 22 cm Tube secured with: Tape Dental Injury: Teeth and Oropharynx as per pre-operative assessment  Comments: IV induction Manney- intubation AM CRNA- front teeth with irregular surface prior to laryngoscopy- atraumatic teeth and mouth as preop

## 2014-02-13 NOTE — Telephone Encounter (Signed)
Patient saw Dr Trenton Gammon @ [facility] Sept 29  Is schedule for surgery Oct 5th

## 2014-02-13 NOTE — Transfer of Care (Signed)
Immediate Anesthesia Transfer of Care Note  Patient: Ellen Hamilton  Procedure(s) Performed: Procedure(s): KYPHOPLASTY LUMBAR FIVE (N/A)  Patient Location: PACU  Anesthesia Type:General  Level of Consciousness: awake and alert   Airway & Oxygen Therapy: Patient Spontanous Breathing and Patient connected to nasal cannula oxygen  Post-op Assessment: Report given to PACU RN and Post -op Vital signs reviewed and stable  Post vital signs: Reviewed and stable  Complications: No apparent anesthesia complications

## 2014-02-13 NOTE — Anesthesia Postprocedure Evaluation (Signed)
  Anesthesia Post-op Note  Patient: Ellen Hamilton  Procedure(s) Performed: Procedure(s): KYPHOPLASTY LUMBAR FIVE (N/A)  Patient Location: PACU  Anesthesia Type:General  Level of Consciousness: awake and alert   Airway and Oxygen Therapy: Patient Spontanous Breathing  Post-op Pain: mild  Post-op Assessment: Post-op Vital signs reviewed, Patient's Cardiovascular Status Stable, Respiratory Function Stable and Patent Airway  Post-op Vital Signs: Reviewed and stable  Last Vitals:  Filed Vitals:   02/13/14 0940  BP: 142/93  Pulse: 50  Temp:   Resp: 12    Complications: No apparent anesthesia complications

## 2014-02-13 NOTE — H&P (Signed)
Ellen Hamilton is an 63 y.o. female.   Chief Complaint: Back pain HPI: 63 year old female with a one-month history of severe lumbosacral pain failing conservative management. Workup demonstrates evidence of an osteoporotic compression fracture at L5. Patient's failed conservative management and presents now for kyphoplasty vertebroplasty Past Medical History  Diagnosis Date  . Hypertension   . Sleep apnea 2009    sleep study done in [place]  . Fibromyalgia   . Osteoporosis   . Compression fracture of L5 lumbar vertebra 01/2014  . Stroke 2008,2009    pt has had two  . History of transient ischemic attack (TIA) 9833,8250  . Hyperlipidemia   . GERD (gastroesophageal reflux disease)     Past Surgical History  Procedure Laterality Date  . Back surgery    . Kyphoplaasty  2010  . Bunionectomy Bilateral 2000,2001  . Eye surgery    . Cataract extraction w/ intraocular lens  implant, bilateral  2010    Family History  Problem Relation Age of Onset  . Mental illness Mother     alzheimers  . Heart disease Father   . COPD Father   . Cancer Father     possible colon CA  . Mental illness Maternal Aunt     alzheimers  . Multiple sclerosis Cousin    Social History:  reports that she has never smoked. She does not have any smokeless tobacco history on file. She reports that she does not use illicit drugs. Her alcohol history is not on file.  Allergies:  Allergies  Allergen Reactions  . Oxycodone Other (See Comments)    hallucinations  . Septra [Sulfamethoxazole-Trimethoprim]     Caused acute renal failure  . Penicillins Rash    Medications Prior to Admission  Medication Sig Dispense Refill  . acetaminophen (TYLENOL) 325 MG tablet Take 325 mg by mouth 4 (four) times daily as needed for moderate pain (take with methocarbamol).       Marland Kitchen amLODipine (NORVASC) 5 MG tablet Take 1 tablet (5 mg total) by mouth daily.  90 tablet  2  . aspirin 81 MG tablet Take 81 mg by mouth 2 (two) times  daily.       . calcitonin, salmon, (MIACALCIN/FORTICAL) 200 UNIT/ACT nasal spray Place 1 spray into alternate nostrils daily.      Marland Kitchen CALCIUM CITRATE PO Take 500 mg by mouth 3 (three) times daily.      . cholecalciferol (VITAMIN D) 1000 UNITS tablet Take 1,000 Units by mouth daily.      Marland Kitchen dipyridamole (PERSANTINE) 50 MG tablet TAKE 4 TABLETS (200 MG TOTAL) TWICE A DAY  720 tablet  1  . fish oil-omega-3 fatty acids 1000 MG capsule Take 1,000 mg by mouth daily.      Marland Kitchen HYDROcodone-acetaminophen (NORCO/VICODIN) 5-325 MG per tablet Take 1 tablet by mouth every 6 (six) hours as needed for moderate pain.  120 tablet  0  . lactulose (CHRONULAC) 10 GM/15ML solution Take 20 g by mouth daily as needed for moderate constipation.      . methocarbamol (ROBAXIN) 500 MG tablet Take 1 tablet (500 mg total) by mouth 4 (four) times daily.  90 tablet  1  . metoprolol succinate (TOPROL-XL) 100 MG 24 hr tablet Take 100 mg by mouth daily. Take with or immediately following a meal.      . metroNIDAZOLE (METROCREAM) 0.75 % cream Apply 1 application topically 2 (two) times daily. Apply to face      . Multiple Vitamin (MULTIVITAMIN) tablet Take  1 tablet by mouth daily.      Marland Kitchen nystatin ointment (MYCOSTATIN) Apply 1 application topically 2 (two) times daily as needed (to rash).      Marland Kitchen omeprazole (PRILOSEC) 40 MG capsule Take 40 mg by mouth daily.      . rosuvastatin (CRESTOR) 40 MG tablet Take 40 mg by mouth daily.      . traMADol (ULTRAM) 50 MG tablet Take 50 mg by mouth every 8 (eight) hours as needed for moderate pain.      . vitamin B-12 (CYANOCOBALAMIN) 1000 MCG tablet Take 1,000 mcg by mouth every other day.        No results found for this or any previous visit (from the past 48 hour(s)). No results found.  Review of Systems  Constitutional: Negative.   HENT: Negative.   Eyes: Negative.   Respiratory: Negative.   Cardiovascular: Negative.   Gastrointestinal: Negative.   Genitourinary: Negative.    Musculoskeletal: Negative.   Skin: Negative.   Neurological: Negative.   Endo/Heme/Allergies: Negative.   Psychiatric/Behavioral: Negative.     Blood pressure 147/68, pulse 57, temperature 98.2 F (36.8 C), temperature source Oral, resp. rate 18, weight 79.379 kg (175 lb), SpO2 98.00%. Physical Exam  Constitutional: She is oriented to person, place, and time. She appears well-developed and well-nourished. No distress.  HENT:  Head: Normocephalic and atraumatic.  Right Ear: External ear normal.  Left Ear: External ear normal.  Nose: Nose normal.  Mouth/Throat: Oropharynx is clear and moist. No oropharyngeal exudate.  Eyes: Conjunctivae and EOM are normal. Pupils are equal, round, and reactive to light. Right eye exhibits no discharge. Left eye exhibits no discharge.  Neck: Normal range of motion. Neck supple. No tracheal deviation present. No thyromegaly present.  Cardiovascular: Normal rate, regular rhythm, normal heart sounds and intact distal pulses.  Exam reveals no friction rub.   No murmur heard. Respiratory: Effort normal and breath sounds normal. No respiratory distress. She has no wheezes.  GI: Soft. Bowel sounds are normal. She exhibits no distension. There is no tenderness.  Musculoskeletal: Normal range of motion. She exhibits no edema and no tenderness.  Neurological: She is alert and oriented to person, place, and time. She has normal reflexes. No cranial nerve deficit. Coordination normal.  Lumbar spasm and tenderness  Skin: Skin is warm and dry. No rash noted. She is not diaphoretic. No erythema. No pallor.     Assessment/Plan L5 osteoporotic compression fracture. Plan percutaneous lumbar kyphoplasty/vertebroplasty. Risks and benefits have been explained. Patient wishes to proceed.  Janace Decker A 02/13/2014, 7:44 AM

## 2014-02-13 NOTE — Op Note (Signed)
Date of procedure: 02/13/2014  Date of dictation: Same  Service: Neurosurgery  Preoperative diagnosis: L5 osteoporotic compression fracture  Postoperative diagnosis: Same  Procedure Name: L5 bilateral kyphoplasty/vertebroplasty  Surgeon:Kenlyn Lose A.Alyzabeth Pontillo, M.D.  Asst. Surgeon: None  Anesthesia: General  Indication: 63 year old female with history of prior os chronic compression fracture T12 treated with kyphoplasty presents now with severe lower back pain following the fall. Workup demonstrates evidence of a superior compression fracture of L5 which is not responded to conservative management for more than 4 weeks. Patient presents now for kyphoplasty/vertebral plasty in hopes of improving her symptoms. Nothing on imaging suggest any type of neoplastic involvement.  Operative note: After induction of anesthesia, patient positioned prone onto Wilson frame and appropriate padded. Lumbar region prepped and draped. A localizing in both AP and lateral fluoroscopic views stab incisions were made overlying the pedicles of L5 bilaterally. Jamshidi needles were then introduced into the pedicles of L5 bilaterally under fluoroscopic guidance. After good position was obtained kyphoplasty balloons were then inserted into both sides the vertebral body. Plasty Boland's were blown up to approximately 170 psi. This compacted the bone reasonably well and did not violate any and plate. Balloons were deflated and withdrawn. Kyphotic deformity was improved. Methylmethacrylate was then inserted in a stepwise fashion under complete fluoroscopic control on both sides. Good filling of the fracture was obtained bilaterally. The methylmethacrylate intercalated throughout the vertebral body without difficulty. There is a very minimal amount of anterior extravasation but nothing to suggest any kind of intravenous spread. After more than 6 cc of methylmethacrylate had been instilled final images were obtained revealing good overall  appearance of the kyphoplasty. Jamshidi needles were removed. Final images once again confirmed the results. Wound is then closed with Vicryl sutures and Steri-Strips and sterile dressing were applied. There were no apparent complications. Patient tolerated the procedure well and she returns to the recovery postop.

## 2014-02-16 ENCOUNTER — Encounter (HOSPITAL_COMMUNITY): Payer: Self-pay | Admitting: Neurosurgery

## 2014-02-24 ENCOUNTER — Encounter: Payer: Self-pay | Admitting: Internal Medicine

## 2014-02-28 ENCOUNTER — Encounter: Payer: Self-pay | Admitting: Internal Medicine

## 2014-02-28 DIAGNOSIS — M8008XD Age-related osteoporosis with current pathological fracture, vertebra(e), subsequent encounter for fracture with routine healing: Secondary | ICD-10-CM

## 2014-02-28 DIAGNOSIS — I69328 Other speech and language deficits following cerebral infarction: Secondary | ICD-10-CM

## 2014-03-04 ENCOUNTER — Other Ambulatory Visit: Payer: Self-pay | Admitting: Internal Medicine

## 2014-03-10 ENCOUNTER — Encounter: Payer: Self-pay | Admitting: Internal Medicine

## 2014-03-13 ENCOUNTER — Encounter: Payer: Self-pay | Admitting: Internal Medicine

## 2014-04-11 ENCOUNTER — Encounter: Payer: Self-pay | Admitting: Internal Medicine

## 2014-04-11 NOTE — Telephone Encounter (Signed)
Please advise patient. I did not see an appt scheduled yet.

## 2014-04-13 ENCOUNTER — Telehealth: Payer: Self-pay | Admitting: Internal Medicine

## 2014-04-13 ENCOUNTER — Encounter: Payer: Self-pay | Admitting: Internal Medicine

## 2014-04-13 DIAGNOSIS — Z79899 Other long term (current) drug therapy: Secondary | ICD-10-CM | POA: Insufficient documentation

## 2014-04-13 DIAGNOSIS — Z9229 Personal history of other drug therapy: Secondary | ICD-10-CM | POA: Insufficient documentation

## 2014-04-13 NOTE — Telephone Encounter (Signed)
Lab letter drafted per patient e mail. , please send to [facility] [employer] .

## 2014-04-14 NOTE — Telephone Encounter (Signed)
Signed and faxed as requested

## 2014-04-14 NOTE — Telephone Encounter (Signed)
Letter printed awaiting sig.

## 2014-04-19 LAB — CBC AND DIFFERENTIAL
HEMATOCRIT: 41 % (ref 36–46)
HEMOGLOBIN: 14.1 g/dL (ref 12.0–16.0)
Neutrophils Absolute: 196 /uL
Platelets: 196 10*3/uL (ref 150–399)
WBC: 3.9 10^3/mL

## 2014-04-19 LAB — LIPID PANEL
Cholesterol: 116 mg/dL (ref 0–200)
HDL: 46 mg/dL (ref 35–70)
LDL Cholesterol: 50 mg/dL
TRIGLYCERIDES: 101 mg/dL (ref 40–160)

## 2014-04-19 LAB — HEPATIC FUNCTION PANEL
ALT: 15 U/L (ref 7–35)
AST: 26 U/L (ref 13–35)
Alkaline Phosphatase: 60 U/L (ref 25–125)
Bilirubin, Total: 0.5 mg/dL

## 2014-04-19 LAB — BASIC METABOLIC PANEL
BUN: 20 mg/dL (ref 4–21)
Creatinine: 0.6 mg/dL (ref 0.5–1.1)
Glucose: 92 mg/dL
Potassium: 4.2 mmol/L (ref 3.4–5.3)
SODIUM: 141 mmol/L (ref 137–147)

## 2014-04-19 LAB — TSH: TSH: 5.85 u[IU]/mL (ref 0.41–5.90)

## 2014-04-20 ENCOUNTER — Encounter: Payer: Self-pay | Admitting: *Deleted

## 2014-04-26 ENCOUNTER — Ambulatory Visit: Payer: Self-pay | Admitting: Internal Medicine

## 2014-04-26 LAB — HM COLONOSCOPY

## 2014-05-04 ENCOUNTER — Telehealth: Payer: Self-pay

## 2014-05-04 ENCOUNTER — Encounter: Payer: Self-pay | Admitting: Internal Medicine

## 2014-05-04 LAB — HM DEXA SCAN

## 2014-05-04 MED ORDER — METOPROLOL SUCCINATE ER 100 MG PO TB24: 100.0000 mg | ORAL_TABLET | Freq: Every day | ORAL | Status: DC

## 2014-05-04 NOTE — Telephone Encounter (Signed)
The pt's husband called and stated the patient is in [place] and has forgotten her Metoprolol medication.  He is hoping an rx can be called into a pharmacy out of town.   Callback - [phone]

## 2014-05-04 NOTE — Telephone Encounter (Signed)
Yes,  10 day supply printed out for use,  Warn patient that they may have to pay out of pckcet if insurance will not cover it

## 2014-05-04 NOTE — Telephone Encounter (Signed)
Patient is out of town and left her Metoprolol rx at home. She will be out of town for 8 days and wanted to know if we could send a short supply to the pharmacy near the town she was in? Please advise   Would like Rx sent to: [facility] [place], [place]

## 2014-05-04 NOTE — Telephone Encounter (Signed)
Called Rx into the [facility] pharmacy as patient requested. Called patient to notify her of comments. Patient verbalized understanding.

## 2014-05-08 ENCOUNTER — Telehealth: Payer: Self-pay | Admitting: Internal Medicine

## 2014-05-08 NOTE — Telephone Encounter (Signed)
Sen mychart

## 2014-05-08 NOTE — Telephone Encounter (Signed)
Bone Density scores received, she has osteopenia,  Moderate , but her recent nontraumatic vertebral fracture indicates that the bone density test is under estimating her risk for additional fractures and she will  need for more aggressive therapy than calcitonin.  If she has never taken alendronate (weekly fosomax), I would recommend starting that.  If she has an intolerance or prior failure on this medication I recommend trying to obtain approval for Prolia which may take up to 3 months. Marland Kitchen

## 2014-05-09 ENCOUNTER — Encounter: Payer: Self-pay | Admitting: Internal Medicine

## 2014-05-09 ENCOUNTER — Other Ambulatory Visit: Payer: Self-pay

## 2014-05-09 MED ORDER — ROSUVASTATIN CALCIUM 40 MG PO TABS: 40.0000 mg | ORAL_TABLET | Freq: Every day | ORAL | Status: DC

## 2014-05-09 MED ORDER — DIPYRIDAMOLE 50 MG PO TABS: ORAL_TABLET | ORAL | Status: DC

## 2014-05-09 NOTE — Addendum Note (Signed)
Addended by: Nanci Pina on: 05/09/2014 05:11 PM   Modules accepted: Orders

## 2014-05-09 NOTE — Telephone Encounter (Signed)
Script recent.

## 2014-05-10 ENCOUNTER — Other Ambulatory Visit: Payer: Self-pay | Admitting: Internal Medicine

## 2014-05-11 ENCOUNTER — Other Ambulatory Visit: Payer: Self-pay | Admitting: Internal Medicine

## 2014-05-12 ENCOUNTER — Encounter: Payer: Self-pay | Admitting: Internal Medicine

## 2014-05-15 ENCOUNTER — Encounter: Payer: Self-pay | Admitting: Internal Medicine

## 2014-05-16 NOTE — Telephone Encounter (Signed)
No problem. It usually takes e to 4 months to get prolia approved because of insurance shenanigans. (yes I said that inlie of a stronger phrase) so we'll start the process now and by May we should have it approved.  Rose, can you start the paperwork for getting P A for Prolia for Ellen Hamilton

## 2014-05-16 NOTE — Telephone Encounter (Signed)
I have electronically sent pt's info for Prolia insurance verification and will notify you once I have a response. Thank you. °

## 2014-06-01 NOTE — Telephone Encounter (Signed)
Sent mychart message with information, also sent to Dr. Derrel Nip as Juluis Rainier

## 2014-06-01 NOTE — Telephone Encounter (Signed)
I have rec'd pt's Prolia insurance verification.  Pt has a deductible of $2,650 and has only met $75.57, which means the entire cost of Prolia would be her responsibility.  However, she can call [phone] see if she qualifies for a $25 co-pay card from [employer].  They will instruct her how to proceed to obtain her injection. I have sent a copy of the summary of benefits to be scanned into her chart. If you have any questions, please let me know. Thank you.

## 2014-06-05 NOTE — Telephone Encounter (Signed)
Yes this was her mychart message reply "I plan to wait for 2 reasons. 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 Secondly, I will go on Medicare starting in May and will need to see what insurance plan I subscribe to. Then I will see where to proceed." FYI

## 2014-06-05 NOTE — Telephone Encounter (Signed)
Ellen Hamilton,  Has patient been notified of roses' message >  If not please do

## 2014-06-06 ENCOUNTER — Encounter: Payer: Self-pay | Admitting: Internal Medicine

## 2014-07-03 ENCOUNTER — Encounter: Payer: Self-pay | Admitting: Internal Medicine

## 2014-07-05 ENCOUNTER — Ambulatory Visit (INDEPENDENT_AMBULATORY_CARE_PROVIDER_SITE_OTHER): Payer: BLUE CROSS/BLUE SHIELD | Admitting: Internal Medicine

## 2014-07-05 ENCOUNTER — Encounter: Payer: Self-pay | Admitting: Internal Medicine

## 2014-07-05 VITALS — BP 118/84 | HR 56 | Temp 98.0°F | Resp 16 | Ht 64.0 in | Wt 174.5 lb

## 2014-07-05 DIAGNOSIS — Z23 Encounter for immunization: Secondary | ICD-10-CM

## 2014-07-05 DIAGNOSIS — I10 Essential (primary) hypertension: Secondary | ICD-10-CM

## 2014-07-05 DIAGNOSIS — E785 Hyperlipidemia, unspecified: Secondary | ICD-10-CM

## 2014-07-05 DIAGNOSIS — Z8639 Personal history of other endocrine, nutritional and metabolic disease: Secondary | ICD-10-CM

## 2014-07-05 DIAGNOSIS — Z0000 Encounter for general adult medical examination without abnormal findings: Secondary | ICD-10-CM

## 2014-07-05 DIAGNOSIS — R7989 Other specified abnormal findings of blood chemistry: Secondary | ICD-10-CM

## 2014-07-05 DIAGNOSIS — Z9989 Dependence on other enabling machines and devices: Secondary | ICD-10-CM

## 2014-07-05 DIAGNOSIS — G4733 Obstructive sleep apnea (adult) (pediatric): Secondary | ICD-10-CM

## 2014-07-05 DIAGNOSIS — Z1239 Encounter for other screening for malignant neoplasm of breast: Secondary | ICD-10-CM

## 2014-07-05 DIAGNOSIS — Z1211 Encounter for screening for malignant neoplasm of colon: Secondary | ICD-10-CM

## 2014-07-05 DIAGNOSIS — M810 Age-related osteoporosis without current pathological fracture: Secondary | ICD-10-CM

## 2014-07-05 MED ORDER — NYSTATIN 100000 UNIT/GM EX OINT
1.0000 | TOPICAL_OINTMENT | Freq: Two times a day (BID) | CUTANEOUS | Status: DC | PRN
Start: 2014-07-05 — End: 2015-04-26

## 2014-07-05 MED ORDER — METOPROLOL SUCCINATE ER 100 MG PO TB24: 100.0000 mg | ORAL_TABLET | Freq: Every day | ORAL | Status: DC

## 2014-07-05 MED ORDER — DIPYRIDAMOLE 50 MG PO TABS: ORAL_TABLET | ORAL | Status: DC

## 2014-07-05 MED ORDER — AMLODIPINE BESYLATE 5 MG PO TABS: 5.0000 mg | ORAL_TABLET | Freq: Every day | ORAL | Status: DC

## 2014-07-05 NOTE — Patient Instructions (Signed)
You received the TDaP vaccine today.  We will give you the  Prevnar vaccine at your next visit.  Referrals for Mammogram, colonoscopy are in process  Please call us in May after your medicare transition to get Prolia authorized for treatment of osteoporosis  Health Maintenance Adopting a healthy lifestyle and getting preventive care can go a long way to promote health and wellness. Talk with your health care provider about what schedule of regular examinations is right for you. This is a good chance for you to check in with your provider about disease prevention and staying healthy. In between checkups, there are plenty of things you can do on your own. Experts have done a lot of research about which lifestyle changes and preventive measures are most likely to keep you healthy. Ask your health care provider for more information. WEIGHT AND DIET  Eat a healthy diet  Be sure to include plenty of vegetables, fruits, low-fat dairy products, and lean protein.  Do not eat a lot of foods high in solid fats, added sugars, or salt.  Get regular exercise. This is one of the most important things you can do for your health.  Most adults should exercise for at least 150 minutes each week. The exercise should increase your heart rate and make you sweat (moderate-intensity exercise).  Most adults should also do strengthening exercises at least twice a week. This is in addition to the moderate-intensity exercise.  Maintain a healthy weight  Body mass index (BMI) is a measurement that can be used to identify possible weight problems. It estimates body fat based on height and weight. Your health care provider can help determine your BMI and help you achieve or maintain a healthy weight.  For females 28 years of age and older:   A BMI below 18.5 is considered underweight.  A BMI of 18.5 to 24.9 is normal.  A BMI of 25 to 29.9 is considered overweight.  A BMI of 30 and above is considered obese.   Watch levels of cholesterol and blood lipids  You should start having your blood tested for lipids and cholesterol at 65 years of age, then have this test every 5 years.  You may need to have your cholesterol levels checked more often if:  Your lipid or cholesterol levels are high.  You are older than 65 years of age.  You are at high risk for heart disease.  CANCER SCREENING   Lung Cancer  Lung cancer screening is recommended for adults 4-55 years old who are at high risk for lung cancer because of a history of smoking.  A yearly low-dose CT scan of the lungs is recommended for people who:  Currently smoke.  Have quit within the past 15 years.  Have at least a 30-pack-year history of smoking. A pack year is smoking an average of one pack of cigarettes a day for 1 year.  Yearly screening should continue until it has been 15 years since you quit.  Yearly screening should stop if you develop a health problem that would prevent you from having lung cancer treatment.  Breast Cancer  Practice breast self-awareness. This means understanding how your breasts normally appear and feel.  It also means doing regular breast self-exams. Let your health care provider know about any changes, no matter how small.  If you are in your 20s or 30s, you should have a clinical breast exam (CBE) by a health care provider every 1-3 years as part of a regular  health exam.  If you are 40 or older, have a CBE every year. Also consider having a breast X-ray (mammogram) every year.  If you have a family history of breast cancer, talk to your health care provider about genetic screening.  If you are at high risk for breast cancer, talk to your health care provider about having an MRI and a mammogram every year.  Breast cancer gene (BRCA) assessment is recommended for women who have family members with BRCA-related cancers. BRCA-related cancers  include:  Breast.  Ovarian.  Tubal.  Peritoneal cancers.  Results of the assessment will determine the need for genetic counseling and BRCA1 and BRCA2 testing. Cervical Cancer Routine pelvic examinations to screen for cervical cancer are no longer recommended for nonpregnant women who are considered low risk for cancer of the pelvic organs (ovaries, uterus, and vagina) and who do not have symptoms. A pelvic examination may be necessary if you have symptoms including those associated with pelvic infections. Ask your health care provider if a screening pelvic exam is right for you.   The Pap test is the screening test for cervical cancer for women who are considered at risk.  If you had a hysterectomy for a problem that was not cancer or a condition that could lead to cancer, then you no longer need Pap tests.  If you are older than 65 years, and you have had normal Pap tests for the past 10 years, you no longer need to have Pap tests.  If you have had past treatment for cervical cancer or a condition that could lead to cancer, you need Pap tests and screening for cancer for at least 20 years after your treatment.  If you no longer get a Pap test, assess your risk factors if they change (such as having a new sexual partner). This can affect whether you should start being screened again.  Some women have medical problems that increase their chance of getting cervical cancer. If this is the case for you, your health care provider may recommend more frequent screening and Pap tests.  The human papillomavirus (HPV) test is another test that may be used for cervical cancer screening. The HPV test looks for the virus that can cause cell changes in the cervix. The cells collected during the Pap test can be tested for HPV.  The HPV test can be used to screen women 52 years of age and older. Getting tested for HPV can extend the interval between normal Pap tests from three to five years.  An HPV  test also should be used to screen women of any age who have unclear Pap test results.  After 65 years of age, women should have HPV testing as often as Pap tests.  Colorectal Cancer  This type of cancer can be detected and often prevented.  Routine colorectal cancer screening usually begins at 65 years of age and continues through 65 years of age.  Your health care provider may recommend screening at an earlier age if you have risk factors for colon cancer.  Your health care provider may also recommend using home test kits to check for hidden blood in the stool.  A small camera at the end of a tube can be used to examine your colon directly (sigmoidoscopy or colonoscopy). This is done to check for the earliest forms of colorectal cancer.  Routine screening usually begins at age 60.  Direct examination of the colon should be repeated every 5-10 years through 65 years  of age. However, you may need to be screened more often if early forms of precancerous polyps or small growths are found. Skin Cancer  Check your skin from head to toe regularly.  Tell your health care provider about any new moles or changes in moles, especially if there is a change in a mole's shape or color.  Also tell your health care provider if you have a mole that is larger than the size of a pencil eraser.  Always use sunscreen. Apply sunscreen liberally and repeatedly throughout the day.  Protect yourself by wearing long sleeves, pants, a wide-brimmed hat, and sunglasses whenever you are outside. HEART DISEASE, DIABETES, AND HIGH BLOOD PRESSURE   Have your blood pressure checked at least every 1-2 years. High blood pressure causes heart disease and increases the risk of stroke.  If you are between 2 years and 36 years old, ask your health care provider if you should take aspirin to prevent strokes.  Have regular diabetes screenings. This involves taking a blood sample to check your fasting blood sugar  level.  If you are at a normal weight and have a low risk for diabetes, have this test once every three years after 65 years of age.  If you are overweight and have a high risk for diabetes, consider being tested at a younger age or more often. PREVENTING INFECTION  Hepatitis B  If you have a higher risk for hepatitis B, you should be screened for this virus. You are considered at high risk for hepatitis B if:  You were born in a country where hepatitis B is common. Ask your health care provider which countries are considered high risk.  Your parents were born in a high-risk country, and you have not been immunized against hepatitis B (hepatitis B vaccine).  You have HIV or AIDS.  You use needles to inject street drugs.  You live with someone who has hepatitis B.  You have had sex with someone who has hepatitis B.  You get hemodialysis treatment.  You take certain medicines for conditions, including cancer, organ transplantation, and autoimmune conditions. Hepatitis C  Blood testing is recommended for:  Everyone born from [birth year] through 1966.  Anyone with known risk factors for hepatitis C. Sexually transmitted infections (STIs)  You should be screened for sexually transmitted infections (STIs) including gonorrhea and chlamydia if:  You are sexually active and are younger than 65 years of age.  You are older than 65 years of age and your health care provider tells you that you are at risk for this type of infection.  Your sexual activity has changed since you were last screened and you are at an increased risk for chlamydia or gonorrhea. Ask your health care provider if you are at risk.  If you do not have HIV, but are at risk, it may be recommended that you take a prescription medicine daily to prevent HIV infection. This is called pre-exposure prophylaxis (PrEP). You are considered at risk if:  You are sexually active and do not regularly use condoms or know the HIV status  of your partner(s).  You take drugs by injection.  You are sexually active with a partner who has HIV. Talk with your health care provider about whether you are at high risk of being infected with HIV. If you choose to begin PrEP, you should first be tested for HIV. You should then be tested every 3 months for as long as you are taking PrEP.  PREGNANCY  If you are premenopausal and you may become pregnant, ask your health care provider about preconception counseling.  If you may become pregnant, take 400 to 800 micrograms (mcg) of folic acid every day.  If you want to prevent pregnancy, talk to your health care provider about birth control (contraception). OSTEOPOROSIS AND MENOPAUSE   Osteoporosis is a disease in which the bones lose minerals and strength with aging. This can result in serious bone fractures. Your risk for osteoporosis can be identified using a bone density scan.  If you are 18 years of age or older, or if you are at risk for osteoporosis and fractures, ask your health care provider if you should be screened.  Ask your health care provider whether you should take a calcium or vitamin D supplement to lower your risk for osteoporosis.  Menopause may have certain physical symptoms and risks.  Hormone replacement therapy may reduce some of these symptoms and risks. Talk to your health care provider about whether hormone replacement therapy is right for you.  HOME CARE INSTRUCTIONS   Schedule regular health, dental, and eye exams.  Stay current with your immunizations.   Do not use any tobacco products including cigarettes, chewing tobacco, or electronic cigarettes.  If you are pregnant, do not drink alcohol.  If you are breastfeeding, limit how much and how often you drink alcohol.  Limit alcohol intake to no more than 1 drink per day for nonpregnant women. One drink equals 12 ounces of beer, 5 ounces of wine, or 1 ounces of hard liquor.  Do not use street  drugs.  Do not share needles.  Ask your health care provider for help if you need support or information about quitting drugs.  Tell your health care provider if you often feel depressed.  Tell your health care provider if you have ever been abused or do not feel safe at home. Document Released: 11/11/2010 Document Revised: 09/12/2013 Document Reviewed: 03/30/2013 [facility] Patient Information 2015 [place], [place]. This information is not intended to replace advice given to you by your health care provider. Make sure you discuss any questions you have with your health care provider.

## 2014-07-05 NOTE — Progress Notes (Signed)
Patient ID: Ellen Hamilton, female   DOB: [birth year], 64 y.o.   MRN: [id number]  Subjective:     Ellen Hamilton is a 64 y.o. female and is here for a comprehensive physical exam. The patient reports no problems.   She was  treated by Dr Trenton Gammon with kyphoplasty for her most recent vertebral compression on Oct 5th with relief of pain after a few days,  Then weaned herself off of the narcotic.  She has been participating in PT and  Working in the pool. Using Nu step, treadmill daily   Prolia was recommended back in 2014 but never done. She has a history of developing esophagitis from fosomax after taking it for 8 years  NEEDS PROLIA  BUT WANTS TO WAIT UNTIL MAY WHEN SHE GOES ON MEDICARE    Enrolled in a double blind controlled drug study with Dr Nicola Girt for post ischemic CVA patients for a drug that is supposed to help patients regain muscular coordination.      History   Social History  . Marital Status: Married    Spouse Name: N/A  . Number of Children: N/A  . Years of Education: N/A   Occupational History  . Not on file.   Social History Main Topics  . Smoking status: Never Smoker   . Smokeless tobacco: Not on file  . Alcohol Use: Not on file  . Drug Use: No  . Sexual Activity: Yes   Other Topics Concern  . Not on file   Social History Narrative   Health Maintenance  Topic Date Due  . HIV Screening  09/27/1965  . INFLUENZA VACCINE  12/11/2014  . MAMMOGRAM  04/01/2015  . COLONOSCOPY  04/26/2024  . TETANUS/TDAP  07/05/2024  . ZOSTAVAX  Completed    The following portions of the patient's history were reviewed and updated as appropriate: allergies, current medications, past family history, past medical history, past social history, past surgical history and problem list.  Review of Systems  Patient denies headache, fevers, malaise, unintentional weight loss, skin rash, eye pain, sinus congestion and sinus pain, sore throat, dysphagia,  hemoptysis , cough,  dyspnea, wheezing, chest pain, palpitations, orthopnea, edema, abdominal pain, nausea, melena, diarrhea, constipation, flank pain, dysuria, hematuria, urinary  Frequency, nocturia, numbness, tingling, seizures,  Focal weakness, Loss of consciousness,  Tremor, insomnia, depression, anxiety, and suicidal ideation.     Objective:   BP 118/84 mmHg  Pulse 56  Temp(Src) 98 F (36.7 C) (Oral)  Resp 16  Ht 5\' 4"  (1.626 m)  Wt 174 lb 8 oz (79.153 kg)  BMI 29.94 kg/m2  SpO2 95%  General appearance: alert, cooperative and appears stated age Head: Normocephalic, without obvious abnormality, atraumatic Eyes: conjunctivae/corneas clear. PERRL, EOM's intact. Fundi benign. Ears: normal TM's and external ear canals both ears Nose: Nares normal. Septum midline. Mucosa normal. No drainage or sinus tenderness. Throat: lips, mucosa, and tongue normal; teeth and gums normal Neck: no adenopathy, no carotid bruit, no JVD, supple, symmetrical, trachea midline and thyroid not enlarged, symmetric, no tenderness/mass/nodules Lungs: clear to auscultation bilaterally Breasts: normal appearance, no masses or tenderness Heart: regular rate and rhythm, S1, S2 normal, no murmur, click, rub or gallop Abdomen: soft, non-tender; bowel sounds normal; no masses,  no organomegaly Extremities: extremities normal, atraumatic, no cyanosis or edema Pulses: 2+ and symmetric Skin: Skin color, texture, turgor normal. No rashes or lesions Neurologic: Alert and oriented X 3, right sided weakness, Normal symmetric reflexes. Normal coordination t.    Assessment  and Plan:    Problem List Items Addressed This Visit    Routine general medical examination at a health care facility    Annual wellness  exam was done as well as a comprehensive physical exam and management of acute and chronic conditions .  During the course of the visit the patient was educated and counseled about appropriate screening and preventive services including :   diabetes screening, lipid analysis with projected  10 year  risk for CAD , nutrition counseling, colorectal cancer screening, and recommended immunizations.  Printed recommendations for health maintenance screenings was given.        Osteoporosis - Primary    With prior vertebral fractures, prior use of fosomax x 8 years .  Will petition for Prolia once she signs on Medicare.  Discussed the current controversies surrounding the risks and benefits of calcium supplementation.  Encouraged her to increase dietary calcium through natural foods including almond/coconut milk      OSA on CPAP    Diagnosed by sleep study. She is wearing her CPAP every night a minimum of 6 hours per night and notes improved daytime wakefulness and decreased fatigue       Hypertension    Well controlled on current regimen. Renal function stable, no changes today. Lab Results  Component Value Date   CREATININE 0.6 04/19/2014    Lab Results  Component Value Date   NA 141 04/19/2014   K 4.2 04/19/2014   CL 94* 02/10/2014   CO2 28 02/10/2014         Relevant Medications   metoprolol succinate (TOPROL-XL) 24 hr tablet   amLODIpine (NORVASC) tablet   Hyperlipidemia    LDL is 550on crestor 40 mg daily.  LFTs nornmal.  No changes today   Lab Results  Component Value Date   CHOL 116 04/19/2014   HDL 46 04/19/2014   LDLCALC 50 04/19/2014   TRIG 101 04/19/2014   CHOLHDL 3 09/09/2013   Lab Results  Component Value Date   ALT 15 04/19/2014   AST 26 04/19/2014   ALKPHOS 60 04/19/2014   BILITOT 0.8 01/03/2014           Relevant Medications   metoprolol succinate (TOPROL-XL) 24 hr tablet   amLODIpine (NORVASC) tablet   History of thyroid nodule    thyroid function is normal.         Other Visit Diagnoses    Abnormal TSH        Relevant Orders    T4 AND TSH (Completed)    Colon cancer screening        Relevant Orders    Ambulatory referral to Gastroenterology    Breast cancer screening         Relevant Orders    MM DIGITAL SCREENING BILATERAL    Need for prophylactic vaccination with combined diphtheria-tetanus-pertussis (DTP) vaccine        Relevant Orders    Tdap vaccine greater than or equal to 7yo IM (Completed)

## 2014-07-05 NOTE — Progress Notes (Signed)
Pre-visit discussion using our clinic review tool. No additional management support is needed unless otherwise documented below in the visit note.  

## 2014-07-05 NOTE — Assessment & Plan Note (Addendum)
With prior vertebral fractures, prior use of fosomax x 8 years .  Will petition for Prolia once she signs on Medicare.  Discussed the current controversies surrounding the risks and benefits of calcium supplementation.  Encouraged her to increase dietary calcium through natural foods including almond/coconut milk

## 2014-07-06 ENCOUNTER — Encounter: Payer: Self-pay | Admitting: Internal Medicine

## 2014-07-06 LAB — T4 AND TSH
T4, Total: 7 ug/dL (ref 4.5–12.0)
TSH: 4 u[IU]/mL (ref 0.450–4.500)

## 2014-07-06 MED ORDER — RANITIDINE HCL 150 MG PO TABS: 75.0000 mg | ORAL_TABLET | Freq: Two times a day (BID) | ORAL | Status: DC

## 2014-07-06 NOTE — Assessment & Plan Note (Signed)
LDL is 550on crestor 40 mg daily.  LFTs nornmal.  No changes today   Lab Results  Component Value Date   CHOL 116 04/19/2014   HDL 46 04/19/2014   LDLCALC 50 04/19/2014   TRIG 101 04/19/2014   CHOLHDL 3 09/09/2013   Lab Results  Component Value Date   ALT 15 04/19/2014   AST 26 04/19/2014   ALKPHOS 60 04/19/2014   BILITOT 0.8 01/03/2014

## 2014-07-06 NOTE — Assessment & Plan Note (Signed)
Well controlled on current regimen. Renal function stable, no changes today. Lab Results  Component Value Date   CREATININE 0.6 04/19/2014    Lab Results  Component Value Date   NA 141 04/19/2014   K 4.2 04/19/2014   CL 94* 02/10/2014   CO2 28 02/10/2014

## 2014-07-06 NOTE — Assessment & Plan Note (Signed)
thyroid function is normal.

## 2014-07-06 NOTE — Assessment & Plan Note (Signed)
Diagnosed by sleep study. She is wearing her CPAP every night a minimum of 6 hours per night and notes improved daytime wakefulness and decreased fatigue  

## 2014-07-06 NOTE — Assessment & Plan Note (Signed)

## 2014-07-14 ENCOUNTER — Telehealth: Payer: Self-pay | Admitting: Internal Medicine

## 2014-07-14 NOTE — Telephone Encounter (Signed)
The patient's husband dropped off records from [facility] . They are in Dr. Lupita Dawn box.

## 2014-07-17 ENCOUNTER — Encounter: Payer: Self-pay | Admitting: Gastroenterology

## 2014-07-27 ENCOUNTER — Encounter: Payer: Self-pay | Admitting: Internal Medicine

## 2014-08-16 ENCOUNTER — Encounter: Payer: Self-pay | Admitting: Gastroenterology

## 2014-08-16 ENCOUNTER — Ambulatory Visit (INDEPENDENT_AMBULATORY_CARE_PROVIDER_SITE_OTHER): Payer: BLUE CROSS/BLUE SHIELD | Admitting: Gastroenterology

## 2014-08-16 ENCOUNTER — Ambulatory Visit: Payer: BLUE CROSS/BLUE SHIELD | Admitting: Gastroenterology

## 2014-08-16 VITALS — BP 120/70 | HR 72 | Ht 64.0 in | Wt 176.4 lb

## 2014-08-16 DIAGNOSIS — Z860100 Personal history of colon polyps, unspecified: Secondary | ICD-10-CM

## 2014-08-16 NOTE — Patient Instructions (Addendum)
Please call with pathology report from the 2010 colonoscopy, will decide about timing of your next colonoscopy, colon cancer screening.  Please call [phone], ask for Patty.

## 2014-08-16 NOTE — Progress Notes (Signed)
HPI: This is a  very pleasant 64 year old woman who was referred to me by Crecencio Mc, MD  to evaluate  history of colon polyps  She is here with her husband today .    04/2009 colonoscopy Dr. Farris Has at [facility], done for 1st degree relative with colon polyps; findings: sigmoid diverticulosis, 57mm hep flexure polyp.  We do not have the pathology from this polyp at the time of her visit today. She was recommended to have repeat colonoscopy at 5 year interval.  04/2005 colonoscopy; report not available at the time of this visit.  Moved from [place] 2-3 years ago.  No colon cancer in her family.  Does have chronic intermittent constipation, nothing she is worried about.  Has been on persantine for many years, has never been asked to stop the persantine for procedures (colonscoyp or gluacoma surgery).  Review of systems: Pertinent positive and negative review of systems were noted in the above HPI section. Complete review of systems was performed and was otherwise normal.    Past Medical History  Diagnosis Date  . Hypertension   . Sleep apnea 2009    sleep study done in [place]  . Fibromyalgia   . Osteoporosis   . Compression fracture of L5 lumbar vertebra 01/2014  . Stroke 2008,2009    pt has had two  . History of transient ischemic attack (TIA) 2423,5361  . Hyperlipidemia   . GERD (gastroesophageal reflux disease)     Past Surgical History  Procedure Laterality Date  . Back surgery    . Kyphoplaasty  2010  . Bunionectomy Bilateral 2000,2001  . Eye surgery    . Cataract extraction w/ intraocular lens  implant, bilateral  2010  . Kyphoplasty N/A 02/13/2014    Procedure: KYPHOPLASTY LUMBAR FIVE;  Surgeon: Charlie Pitter, MD;  Location: [facility];  Service: Neurosurgery;  Laterality: N/A;    Current Outpatient Prescriptions  Medication Sig Dispense Refill  . acetaminophen (TYLENOL) 325 MG tablet Take 325 mg by mouth 4 (four) times daily as needed for moderate  pain (take with methocarbamol).     Marland Kitchen amLODipine (NORVASC) 5 MG tablet Take 1 tablet (5 mg total) by mouth daily. 90 tablet 1  . aspirin 81 MG tablet Take 81 mg by mouth 2 (two) times daily.     . calcitonin, salmon, (MIACALCIN/FORTICAL) 200 UNIT/ACT nasal spray Place 1 spray into alternate nostrils daily.    Marland Kitchen CALCIUM CITRATE PO Take 500 mg by mouth 3 (three) times daily.    . cholecalciferol (VITAMIN D) 1000 UNITS tablet Take 1,000 Units by mouth daily.    . Dalfampridine (AMPYRA PO) Take 7.5 mg by mouth 2 (two) times daily.    Marland Kitchen dipyridamole (PERSANTINE) 50 MG tablet TAKE 4 TABLETS (200 MG TOTAL) TWICE A DAY 720 tablet 1  . fish oil-omega-3 fatty acids 1000 MG capsule Take 1,000 mg by mouth daily.    . metoprolol succinate (TOPROL-XL) 100 MG 24 hr tablet Take 1 tablet (100 mg total) by mouth daily. Take with or immediately following a meal. 90 tablet 3  . Multiple Vitamin (MULTIVITAMIN) tablet Take 1 tablet by mouth daily.    Marland Kitchen nystatin ointment (MYCOSTATIN) Apply 1 application topically 2 (two) times daily as needed (to rash). 30 g 2  . ranitidine (ZANTAC) 150 MG tablet Take 0.5 tablets (75 mg total) by mouth 2 (two) times daily. 180 tablet 1  . rosuvastatin (CRESTOR) 40 MG tablet Take 1 tablet (40 mg  total) by mouth daily. 90 tablet 1  . vitamin B-12 (CYANOCOBALAMIN) 1000 MCG tablet Take 1,000 mcg by mouth every other day.     No current facility-administered medications for this visit.    Allergies as of 08/16/2014 - Review Complete 08/16/2014  Allergen Reaction Noted  . Oxycodone Other (See Comments) 06/04/2012  . Septra [sulfamethoxazole-trimethoprim]  11/03/2012  . Penicillins Rash 06/04/2012    Family History  Problem Relation Age of Onset  . Mental illness Mother     alzheimers  . Heart disease Father   . COPD Father   . Cancer Father     possible colon CA  . Mental illness Maternal Aunt     alzheimers  . Multiple sclerosis Cousin     History   Social History  .  Marital Status: Married    Spouse Name: N/A  . Number of Children: 1  . Years of Education: N/A   Occupational History  . Not on file.   Social History Main Topics  . Smoking status: Never Smoker   . Smokeless tobacco: Never Used  . Alcohol Use: 4.2 oz/week    7 Glasses of wine per week  . Drug Use: No  . Sexual Activity: Yes   Other Topics Concern  . Not on file   Social History Narrative       Physical Exam: BP 120/70 mmHg  Pulse 72  Ht 5\' 4"  (1.626 m)  Wt 176 lb 6.4 oz (80.015 kg)  BMI 30.26 kg/m2 Constitutional: generally well-appearing, walks with a cane Psychiatric: alert and oriented x3 Eyes: extraocular movements intact Mouth: oral pharynx moist, no lesions Neck: supple no lymphadenopathy Cardiovascular: heart regular rate and rhythm Lungs: clear to auscultation bilaterally Abdomen: soft, nontender, nondistended, no obvious ascites, no peritoneal signs, normal bowel sounds Extremities: no lower extremity edema bilaterally Skin: no lesions on visible extremities    Assessment and plan: 64 y.o. female with  previous history of colon polyps, unclear pathology. History of strokes  We will get the pathology report from her 2010 colonoscopy. I explained to them that if the polyp was indeed a precancerous polyp such as an adenomatous polyp or sessile serrated polyp that she would probably need another colonoscopy around now. If it was not a precancerous polyp that she would be good for 10 years from the date of her last colonoscopy which would be December 2020. They seem to understand the importance of the pathology. Have a copy of her pathology report at home and they will call back and also fax copies to [place]. Her husband also asked that I review his previous colonoscopy reports and pathology to see if I agree on the timing of his next colonoscopy as well. I am happy to do that for him. She takes Persantine which is an antiplatelet medicine. She used to be on Aggrenox.  Persantine is not significant enough a blood thinner, antiplatelet agent to warrant holding prior to endoscopic procedures     Cc: Crecencio Mc, MD

## 2014-08-18 ENCOUNTER — Telehealth: Payer: Self-pay | Admitting: Gastroenterology

## 2014-08-18 DIAGNOSIS — Z86010 Personal history of colonic polyps: Secondary | ICD-10-CM

## 2014-08-18 NOTE — Telephone Encounter (Signed)
Left message on machine to call back  

## 2014-08-18 NOTE — Telephone Encounter (Signed)
04/2009 colonoscopy Dr. Farris Has at [facility], done for 1st degree relative with colon polyps; findings: sigmoid diverticulosis, 6mm hep flexure polyp. We do not have the pathology from this polyp at the time of her visit today. She was recommended to have repeat colonoscopy at 5 year interval.  04/2005 colonoscopy; report not available at the time of this visit.   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 She does need colonoscopy around now. Please call her to coordinate.  Thanks

## 2014-08-22 MED ORDER — MOVIPREP 100 G PO SOLR: 1.0000 | Freq: Once | ORAL | Status: DC

## 2014-08-22 NOTE — Telephone Encounter (Signed)
08/24/14 10 am pt will come in and I will go over instructions and have paperwork signed.

## 2014-08-23 ENCOUNTER — Encounter: Payer: Self-pay | Admitting: Gastroenterology

## 2014-08-25 ENCOUNTER — Encounter: Payer: BLUE CROSS/BLUE SHIELD | Admitting: Gastroenterology

## 2014-08-28 ENCOUNTER — Encounter: Payer: BLUE CROSS/BLUE SHIELD | Admitting: Gastroenterology

## 2014-08-28 ENCOUNTER — Ambulatory Visit (AMBULATORY_SURGERY_CENTER): Payer: BLUE CROSS/BLUE SHIELD | Admitting: Gastroenterology

## 2014-08-28 ENCOUNTER — Encounter: Payer: Self-pay | Admitting: Gastroenterology

## 2014-08-28 VITALS — BP 97/65 | HR 56 | Temp 97.0°F | Resp 18 | Ht 64.0 in | Wt 176.0 lb

## 2014-08-28 DIAGNOSIS — K5730 Diverticulosis of large intestine without perforation or abscess without bleeding: Secondary | ICD-10-CM

## 2014-08-28 DIAGNOSIS — Z860100 Personal history of colon polyps, unspecified: Secondary | ICD-10-CM

## 2014-08-28 MED ORDER — SODIUM CHLORIDE 0.9 % IV SOLN: 500.0000 mL | INTRAVENOUS | Status: DC

## 2014-08-28 NOTE — Op Note (Signed)
[facility]  [address] [place], [address]   COLONOSCOPY PROCEDURE REPORT  PATIENT: Ellen, Hamilton  MR#: [id number] BIRTHDATE: 1950-05-25 , 9  yrs. old GENDER: female ENDOSCOPIST: Milus Banister, MD REFERRED ZJ:IRCVEL Derrel Nip, M.D. PROCEDURE DATE:  08/28/2014 PROCEDURE:   Colonoscopy, surveillance First Screening Colonoscopy - Avg.  risk and is 50 yrs.  old or older - No.  Prior Negative Screening - Now for repeat screening. N/A  History of Adenoma - Now for follow-up colonoscopy & has been > or = to 3 yrs.  Yes hx of adenoma.  Has been 3 or more years since last colonoscopy. ASA CLASS:   Class II INDICATIONS:Surveillance due to prior colonic neoplasia and 04/2009 colonoscopy Dr.  Farris Has at [facility], done for 1st degree relative with colon polyps; findings: sigmoid diverticulosis, 58mm hep flexure polyp.  Was TA on pathology. MEDICATIONS: Monitored anesthesia care and Propofol 200 mg IV  DESCRIPTION OF PROCEDURE:   After the risks benefits and alternatives of the procedure were thoroughly explained, informed consent was obtained.  The digital rectal exam revealed no abnormalities of the rectum.   The LB [id number]  endoscope was introduced through the anus and advanced to the cecum, which was identified by both the appendix and ileocecal valve. No adverse events experienced.   The quality of the prep was excellent.  The instrument was then slowly withdrawn as the colon was fully examined.   COLON FINDINGS: There was mild diverticulosis noted in the left colon.   The examination was otherwise normal.  Retroflexed views revealed no abnormalities. The time to cecum = 3.0 Withdrawal time = 6.4   The scope was withdrawn and the procedure completed. COMPLICATIONS: There were no immediate complications.  ENDOSCOPIC IMPRESSION: 1.   Mild diverticulosis was noted in the left colon 2.   The examination was otherwise normal; no polyps or  cancers  RECOMMENDATIONS: You should continue to follow colorectal cancer screening guidelines for "routine risk" patients with a repeat colonoscopy in 10 years.   eSigned:  Milus Banister, MD 08/28/2014 7:49 AM

## 2014-08-28 NOTE — Patient Instructions (Signed)
YOU HAD AN ENDOSCOPIC PROCEDURE TODAY AT [facility]:   Refer to the procedure report that was given to you for any specific questions about what was found during the examination.  If the procedure report does not answer your questions, please call your gastroenterologist to clarify.  If you requested that your care partner not be given the details of your procedure findings, then the procedure report has been included in a sealed envelope for you to review at your convenience later.  YOU SHOULD EXPECT: Some feelings of bloating in the abdomen. Passage of more gas than usual.  Walking can help get rid of the air that was put into your GI tract during the procedure and reduce the bloating. If you had a lower endoscopy (such as a colonoscopy or flexible sigmoidoscopy) you may notice spotting of blood in your stool or on the toilet paper. If you underwent a bowel prep for your procedure, you may not have a normal bowel movement for a few days.  Please Note:  You might notice some irritation and congestion in your nose or some drainage.  This is from the oxygen used during your procedure.  There is no need for concern and it should clear up in a day or so.  SYMPTOMS TO REPORT IMMEDIATELY:   Following lower endoscopy (colonoscopy or flexible sigmoidoscopy):  Excessive amounts of blood in the stool  Significant tenderness or worsening of abdominal pains  Swelling of the abdomen that is new, acute  Fever of 100F or higher   For urgent or emergent issues, a gastroenterologist can be reached at any hour by calling [phone].   DIET: Your first meal following the procedure should be a small meal and then it is ok to progress to your normal diet. Heavy or fried foods are harder to digest and may make you feel nauseous or bloated.  Likewise, meals heavy in dairy and vegetables can increase bloating.  Drink plenty of fluids but you should avoid alcoholic beverages for 24 hours. Try to  increase the fiber in your diet due to the Diverticulosis.  ACTIVITY:  You should plan to take it easy for the rest of today and you should NOT DRIVE or use heavy machinery until tomorrow (because of the sedation medicines used during the test).    FOLLOW UP: Our staff will call the number listed on your records the next business day following your procedure to check on you and address any questions or concerns that you may have regarding the information given to you following your procedure. If we do not reach you, we will leave a message.  However, if you are feeling well and you are not experiencing any problems, there is no need to return our call.  We will assume that you have returned to your regular daily activities without incident.  Read all of the handouts given to you by your recovery room nurse.  If any biopsies were taken you will be contacted by phone or by letter within the next 1-3 weeks.  Please call us at [phone] if you have not heard about the biopsies in 3 weeks.    SIGNATURES/CONFIDENTIALITY: You and/or your care partner have signed paperwork which will be entered into your electronic medical record.  These signatures attest to the fact that that the information above on your After Visit Summary has been reviewed and is understood.  Full responsibility of the confidentiality of this discharge information lies with you and/or your  care-partner.

## 2014-08-28 NOTE — Progress Notes (Signed)
To recovery, report to Hodges, RN, VSS 

## 2014-08-29 ENCOUNTER — Telehealth: Payer: Self-pay | Admitting: *Deleted

## 2014-08-29 NOTE — Telephone Encounter (Signed)
  Follow up Call-  Call back number 08/28/2014  Post procedure Call Back phone  # [phone]  Permission to leave phone message Yes     Patient questions:  Do you have a fever, pain , or abdominal swelling? No. Pain Score  0 *  Have you tolerated food without any problems? Yes.    Have you been able to return to your normal activities? Yes.    Do you have any questions about your discharge instructions: Diet   No. Medications  No. Follow up visit  No.  Do you have questions or concerns about your Care? No.  Actions: * If pain score is 4 or above: No action needed, pain <4.

## 2014-09-17 ENCOUNTER — Encounter: Payer: Self-pay | Admitting: Internal Medicine

## 2014-09-21 ENCOUNTER — Ambulatory Visit: Payer: BLUE CROSS/BLUE SHIELD

## 2014-10-11 ENCOUNTER — Ambulatory Visit (INDEPENDENT_AMBULATORY_CARE_PROVIDER_SITE_OTHER): Payer: Medicare Other

## 2014-10-11 DIAGNOSIS — Z23 Encounter for immunization: Secondary | ICD-10-CM

## 2014-10-11 NOTE — Progress Notes (Signed)
Patient received Prevnar Injection in Right Deltoid.  Gave information on Injection.  Patient tolerated well.

## 2014-11-07 IMAGING — CR DG RIBS 2V*R*
1 series · 3 of 3 positions shown · non-contrast
Comparison: none

REASON FOR EXAM: fall
COMMENTS:

PROCEDURE:     DXR - DXR RIBS RIGHT UNILATERAL  - May 03, 2012 [DATE]
RESULT:     Comparison: None.

[Series 1: t ribs ap upper right · 0.14mm/px · 3 of 3 slices shown]
[im 1/3]
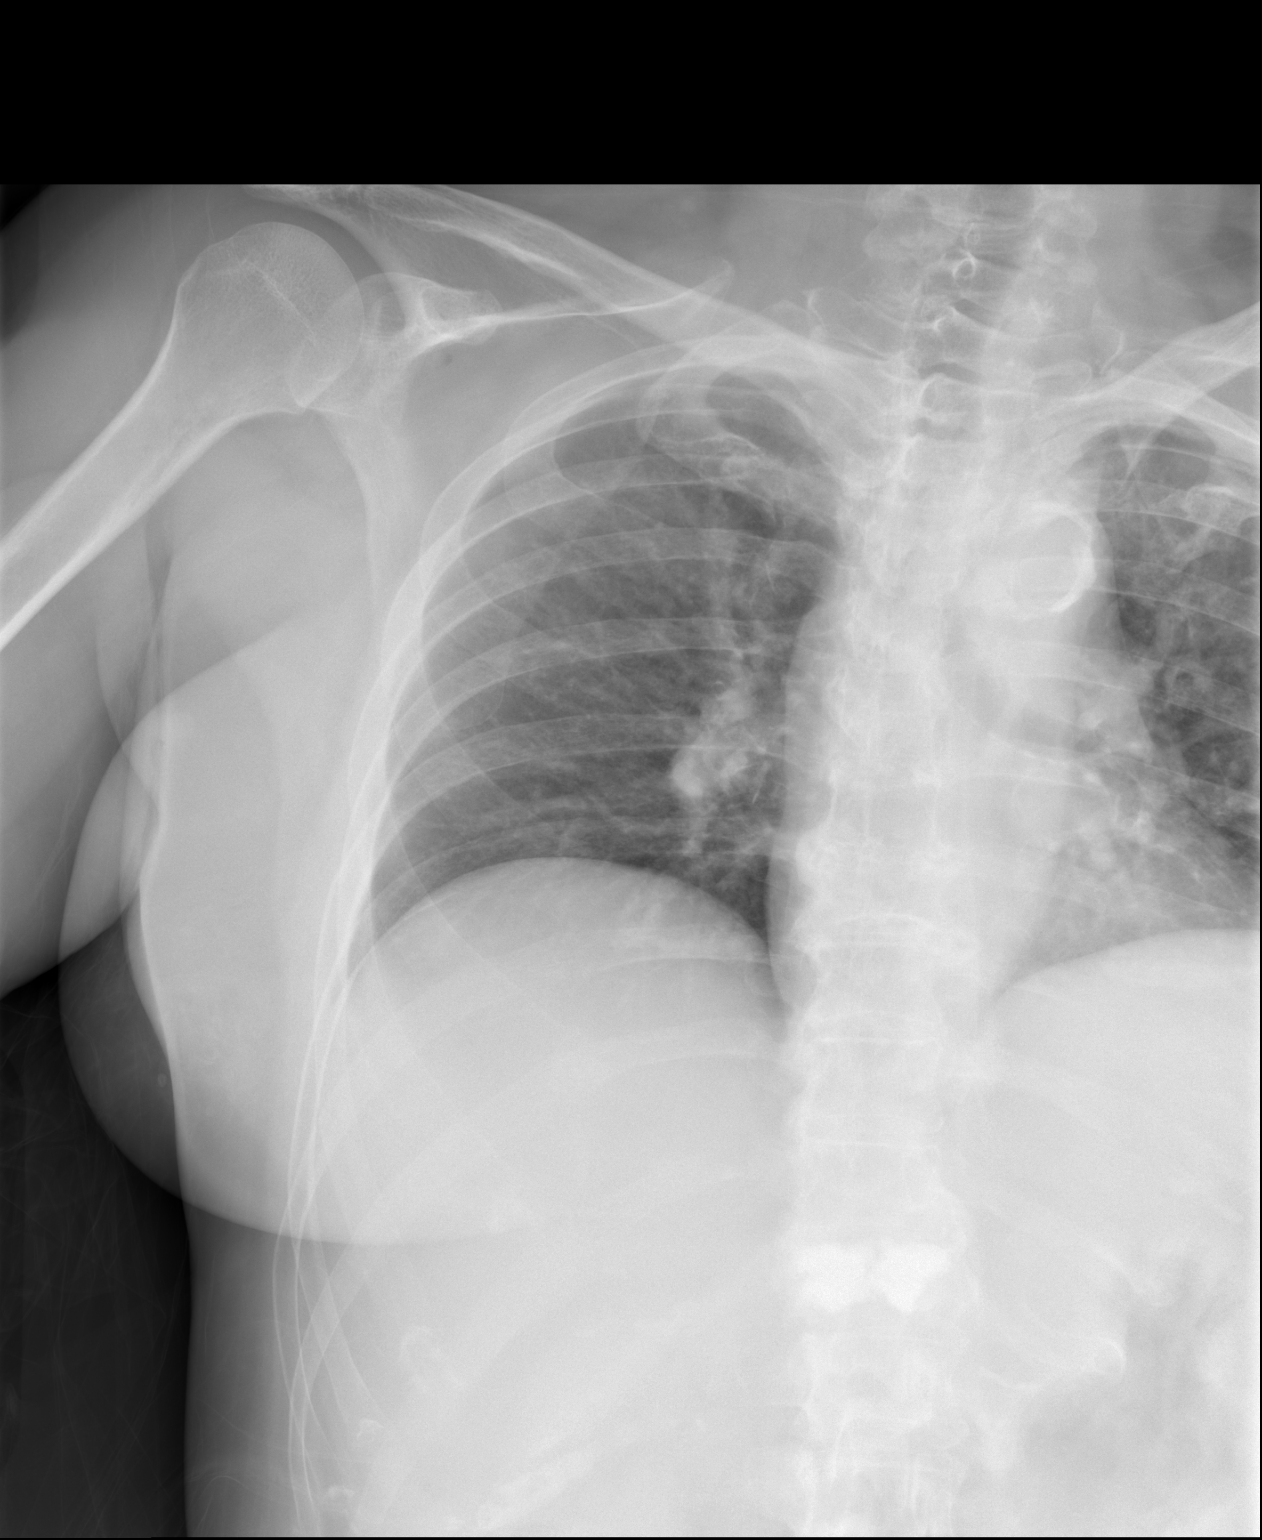
[im 2/3]
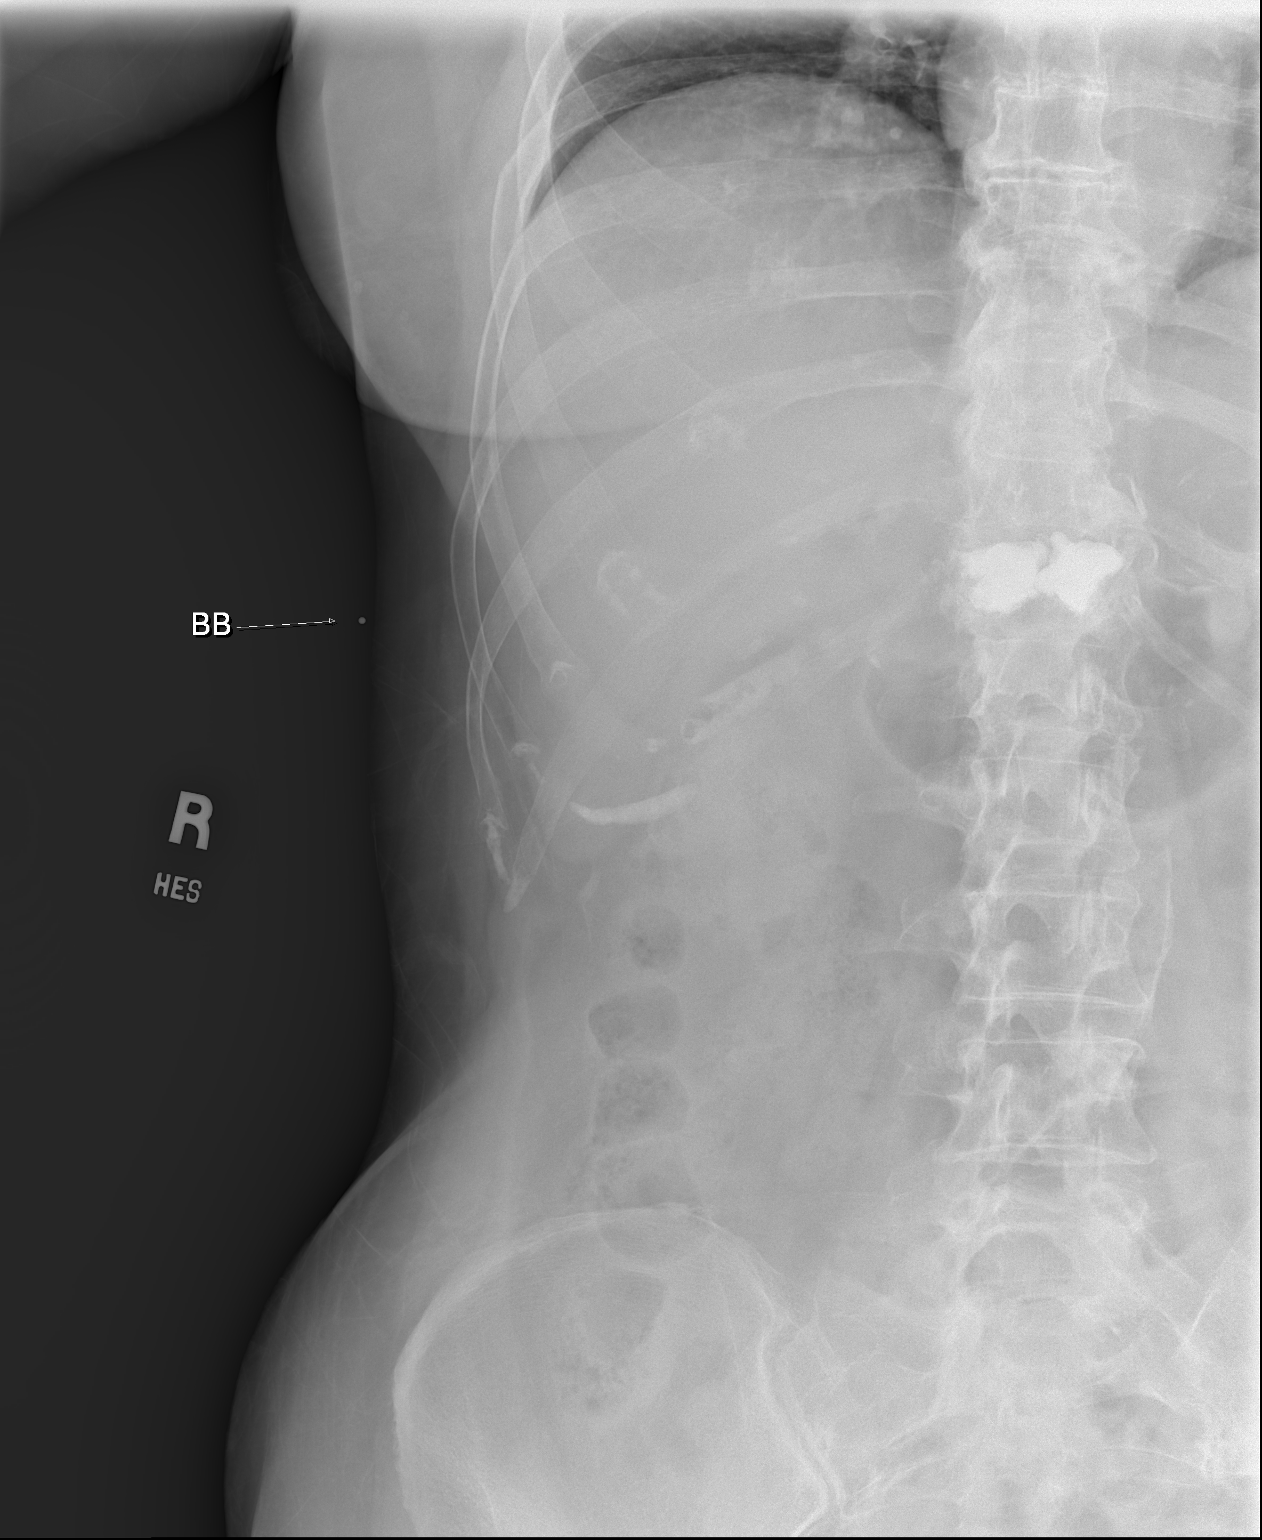
[im 3/3]
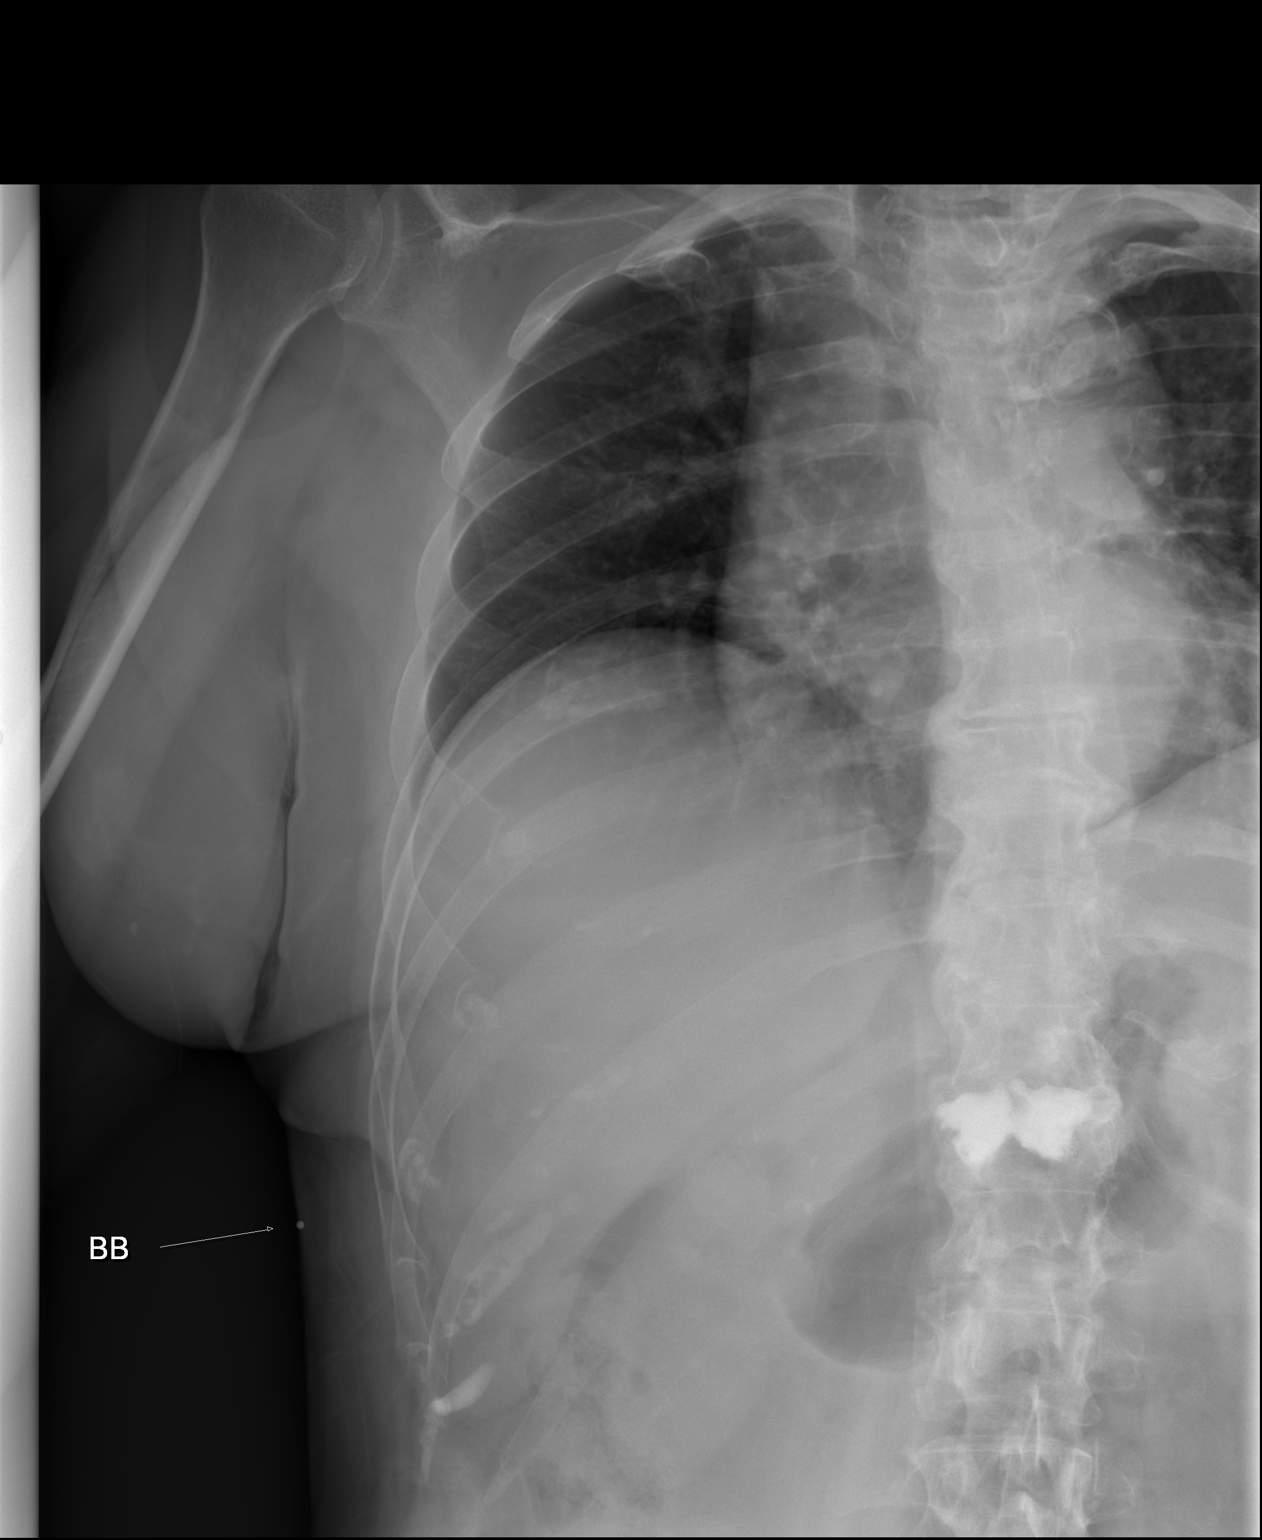

[3 of 3 positions shown; findings below may reference images not displayed]

FINDINGS: There is minimal degenerative change of the acromioclavicular joint. There
are nondisplaced fractures of the posterior lateral right seventh and eighth
ribs. No pneumothorax. Vertebroplasty cement is seen in the lower thoracic
spine.
IMPRESSION: Nondisplaced fractures of the posterior lateral right seventh and eighth
ribs.

[REDACTED]

## 2014-11-20 ENCOUNTER — Other Ambulatory Visit: Payer: Self-pay | Admitting: Internal Medicine

## 2015-01-19 ENCOUNTER — Other Ambulatory Visit: Payer: Self-pay | Admitting: Internal Medicine

## 2015-02-17 DIAGNOSIS — Z23 Encounter for immunization: Secondary | ICD-10-CM | POA: Diagnosis not present

## 2015-03-11 ENCOUNTER — Encounter: Payer: Self-pay | Admitting: Internal Medicine

## 2015-03-28 ENCOUNTER — Other Ambulatory Visit: Payer: Self-pay | Admitting: Internal Medicine

## 2015-04-16 ENCOUNTER — Other Ambulatory Visit: Payer: Self-pay | Admitting: Internal Medicine

## 2015-04-26 ENCOUNTER — Telehealth: Payer: Self-pay | Admitting: Internal Medicine

## 2015-04-26 ENCOUNTER — Ambulatory Visit (INDEPENDENT_AMBULATORY_CARE_PROVIDER_SITE_OTHER): Payer: Medicare Other | Admitting: Internal Medicine

## 2015-04-26 ENCOUNTER — Encounter: Payer: Self-pay | Admitting: Internal Medicine

## 2015-04-26 VITALS — BP 120/78 | HR 60 | Temp 97.9°F | Resp 12 | Ht 64.0 in | Wt 179.8 lb

## 2015-04-26 DIAGNOSIS — I10 Essential (primary) hypertension: Secondary | ICD-10-CM

## 2015-04-26 DIAGNOSIS — S32050D Wedge compression fracture of fifth lumbar vertebra, subsequent encounter for fracture with routine healing: Secondary | ICD-10-CM | POA: Diagnosis not present

## 2015-04-26 DIAGNOSIS — Z79899 Other long term (current) drug therapy: Secondary | ICD-10-CM

## 2015-04-26 DIAGNOSIS — B3789 Other sites of candidiasis: Secondary | ICD-10-CM | POA: Diagnosis not present

## 2015-04-26 LAB — COMPREHENSIVE METABOLIC PANEL
ALBUMIN: 4.6 g/dL (ref 3.5–5.2)
ALK PHOS: 47 U/L (ref 39–117)
ALT: 16 U/L (ref 0–35)
AST: 26 U/L (ref 0–37)
BUN: 13 mg/dL (ref 6–23)
CHLORIDE: 102 meq/L (ref 96–112)
CO2: 31 mEq/L (ref 19–32)
CREATININE: 0.73 mg/dL (ref 0.40–1.20)
Calcium: 9.6 mg/dL (ref 8.4–10.5)
GFR: 84.89 mL/min (ref >60.00)
Glucose, Bld: 88 mg/dL (ref 70–99)
Potassium: 4 mEq/L (ref 3.5–5.1)
SODIUM: 138 meq/L (ref 135–145)
TOTAL PROTEIN: 7.5 g/dL (ref 6.0–8.3)
Total Bilirubin: 0.6 mg/dL (ref 0.2–1.2)

## 2015-04-26 MED ORDER — NYSTATIN 100000 UNIT/GM EX POWD: Freq: Two times a day (BID) | CUTANEOUS | Status: DC

## 2015-04-26 MED ORDER — TRIAMCINOLONE ACETONIDE 0.1 % EX CREA: 1.0000 "application " | TOPICAL_CREAM | Freq: Two times a day (BID) | CUTANEOUS | Status: DC

## 2015-04-26 NOTE — Progress Notes (Signed)
Pre-visit discussion using our clinic review tool. No additional management support is needed unless otherwise documented below in the visit note.  

## 2015-04-26 NOTE — Progress Notes (Signed)
Subjective:  Patient ID: Ellen Hamilton, female    DOB: 11/27/1950  Age: 64 y.o. MRN: [id number]  CC: The primary encounter diagnosis was Long-term use of high-risk medication. Diagnoses of Candidiasis of breast and Compression fracture of L5 lumbar vertebra, with routine healing, subsequent encounter were also pertinent to this visit.  HPI Ellen Hamilton presents for recurrrent rash under breasts . She has been using nystatin ointment but the resh has not resolvd and actuallly bllstered.  She swims in a public pool 5 -7 days per week for PT .  She does not remain in her bathing suit for an extended period of time.   She had a colonoscopy April 2016:  Tics,  No polyps  10 yr follow up   The clinical trial ended Nov 22,  No clinical difference between placebo and drug.  increased risk of falls noted with drug,  Had a minor fall felt that the drug did help her leg strength  But felt it reached maximal benefit.  Leg weakness retruned after drug was stopped.     Outpatient Prescriptions Prior to Visit  Medication Sig Dispense Refill  . acetaminophen (TYLENOL) 325 MG tablet Take 325 mg by mouth 4 (four) times daily as needed for moderate pain (take with methocarbamol).     Marland Kitchen amLODipine (NORVASC) 5 MG tablet TAKE 1 TABLET DAILY 90 tablet 0  . aspirin 81 MG tablet Take 81 mg by mouth 2 (two) times daily.     . calcitonin, salmon, (MIACALCIN/FORTICAL) 200 UNIT/ACT nasal spray Place 1 spray into alternate nostrils daily.    Marland Kitchen CALCIUM CITRATE PO Take 500 mg by mouth 3 (three) times daily.    . cholecalciferol (VITAMIN D) 1000 UNITS tablet Take 1,000 Units by mouth daily.    Marland Kitchen dipyridamole (PERSANTINE) 50 MG tablet TAKE 4 TABLETS (200 MG) TWICE A DAY 720 tablet 2  . fish oil-omega-3 fatty acids 1000 MG capsule Take 1,000 mg by mouth daily.    . metoprolol succinate (TOPROL-XL) 100 MG 24 hr tablet Take 1 tablet (100 mg total) by mouth daily. Take with or immediately following a meal. 90  tablet 3  . Multiple Vitamin (MULTIVITAMIN) tablet Take 1 tablet by mouth daily.    . rosuvastatin (CRESTOR) 40 MG tablet Take 1 tablet (40 mg total) by mouth daily. 90 tablet 1  . vitamin B-12 (CYANOCOBALAMIN) 1000 MCG tablet Take 1,000 mcg by mouth every other day.    . CRESTOR 40 MG tablet TAKE 1 TABLET DAILY 90 tablet 0  . nystatin ointment (MYCOSTATIN) Apply 1 application topically 2 (two) times daily as needed (to rash). 30 g 2  . Dalfampridine (AMPYRA PO) Take 7.5 mg by mouth 2 (two) times daily. Reported on 04/26/2015    . MOVIPREP 100 G SOLR Take 1 kit (200 g total) by mouth once. (Patient not taking: Reported on 04/26/2015) 1 kit 0  . ranitidine (ZANTAC) 150 MG tablet Take 0.5 tablets (75 mg total) by mouth 2 (two) times daily. (Patient not taking: Reported on 08/28/2014) 180 tablet 1  . metoprolol succinate (TOPROL-XL) 100 MG 24 hr tablet TAKE 1 TABLET DAILY 90 tablet 0   No facility-administered medications prior to visit.    Review of Systems;  Patient denies headache, fevers, malaise, unintentional weight loss, skin rash, eye pain, sinus congestion and sinus pain, sore throat, dysphagia,  hemoptysis , cough, dyspnea, wheezing, chest pain, palpitations, orthopnea, edema, abdominal pain, nausea, melena, diarrhea, constipation, flank pain, dysuria, hematuria, urinary  Frequency, nocturia, numbness, tingling, seizures,  Focal weakness, Loss of consciousness,  Tremor, insomnia, depression, anxiety, and suicidal ideation.      Objective:  BP 120/78 mmHg  Pulse 60  Temp(Src) 97.9 F (36.6 C) (Oral)  Resp 12  Ht 5' 4" (1.626 m)  Wt 179 lb 12 oz (81.534 kg)  BMI 30.84 kg/m2  SpO2 97%  BP Readings from Last 3 Encounters:  04/26/15 120/78  08/28/14 97/65  08/16/14 120/70    Wt Readings from Last 3 Encounters:  04/26/15 179 lb 12 oz (81.534 kg)  08/28/14 176 lb (79.833 kg)  08/16/14 176 lb 6.4 oz (80.015 kg)    General appearance: alert, cooperative and appears stated  age Ears: normal TM's and external ear canals both ears Throat: lips, mucosa, and tongue normal; teeth and gums normal Neck: no adenopathy, no carotid bruit, supple, symmetrical, trachea midline and thyroid not enlarged, symmetric, no tenderness/mass/nodules Back: symmetric, no curvature. ROM normal. No CVA tenderness. Lungs: clear to auscultation bilaterally Heart: regular rate and rhythm, S1, S2 normal, no murmur, click, rub or gallop Abdomen: soft, non-tender; bowel sounds normal; no masses,  no organomegaly Pulses: 2+ and symmetric Skin: Skin color, texture, turgor normal. No rashes or lesions Lymph nodes: Cervical, supraclavicular, and axillary nodes normal.  No results found for: HGBA1C  Lab Results  Component Value Date   CREATININE 0.73 04/26/2015   CREATININE 0.6 04/19/2014   CREATININE 0.81 02/10/2014    Lab Results  Component Value Date   WBC 3.9 04/19/2014   HGB 14.1 04/19/2014   HCT 41 04/19/2014   PLT 196 04/19/2014   GLUCOSE 88 04/26/2015   CHOL 116 04/19/2014   TRIG 101 04/19/2014   HDL 46 04/19/2014   LDLCALC 50 04/19/2014   ALT 16 04/26/2015   AST 26 04/26/2015   NA 138 04/26/2015   K 4.0 04/26/2015   CL 102 04/26/2015   CREATININE 0.73 04/26/2015   BUN 13 04/26/2015   CO2 31 04/26/2015   TSH 4.000 07/05/2014    No results found.  Assessment & Plan:   Problem List Items Addressed This Visit    Compression fracture of L5 lumbar vertebra ([place])    She has decided to start taking Prolia . Referral made .      Candidiasis of breast    With stage 2 ulcer.  Statin powder twice daily ,  Followed by triamcinolone. Pack area with gauze.       Relevant Medications   nystatin (MYCOSTATIN) powder   Long-term use of high-risk medication - Primary   Relevant Orders   Comprehensive metabolic panel (Completed)     A total of 15 minutes of face to face time was spent with patient more than half of which was spent in counselling about the above mentioned  conditions  and coordination of care   I have discontinued Ms. Bilello's nystatin ointment and CRESTOR. I am also having her start on triamcinolone cream and nystatin. Additionally, I am having her maintain her aspirin, vitamin B-12, cholecalciferol, fish oil-omega-3 fatty acids, multivitamin, acetaminophen, calcitonin (salmon), CALCIUM CITRATE PO, rosuvastatin, metoprolol succinate, ranitidine, Dalfampridine (AMPYRA PO), MOVIPREP, dipyridamole, and amLODipine.  Meds ordered this encounter  Medications  . triamcinolone cream (KENALOG) 0.1 %    Sig: Apply 1 application topically 2 (two) times daily.    Dispense:  30 g    Refill:  0  . nystatin (MYCOSTATIN) powder    Sig: Apply topically 2 (two) times daily.    Dispense:  15 g    Refill:  3    Medications Discontinued During This Encounter  Medication Reason  . metoprolol succinate (TOPROL-XL) 100 MG 24 hr tablet Duplicate  . nystatin ointment (MYCOSTATIN)   . CRESTOR 40 MG tablet     Follow-up: No Follow-up on file.   Crecencio Mc, MD

## 2015-04-26 NOTE — Telephone Encounter (Signed)
Patient needs PROLIA .  CAN YOU START THE PROCESS?

## 2015-04-26 NOTE — Patient Instructions (Signed)
YOUR RASH MAY BE PERSISTENT BECAUSE IT IS STAYING TOO MOIST  Please start using triamcinolone ointment (thin layer) twice daily  Then add nystatin POWDER AFTER YOU HAVE APPLIED THE OINTMENT, twice daily  Place a nonstick cotton gauze pad under your breast to prevent "skin on skin" seal (which encourages yeast to grow)   Avoid getting area under breast wet when in the pool  Air dry the area after your shower and before you apply the medications   The Prolia order will be initiated  It may take several weeks

## 2015-04-26 NOTE — Telephone Encounter (Signed)
Please start PA for Prolia.  Thanks

## 2015-04-28 ENCOUNTER — Encounter: Payer: Self-pay | Admitting: Internal Medicine

## 2015-04-28 DIAGNOSIS — B379 Candidiasis, unspecified: Secondary | ICD-10-CM | POA: Insufficient documentation

## 2015-04-28 DIAGNOSIS — B3789 Other sites of candidiasis: Secondary | ICD-10-CM | POA: Insufficient documentation

## 2015-04-28 NOTE — Assessment & Plan Note (Signed)
She has decided to start taking Prolia . Referral made .

## 2015-04-28 NOTE — Assessment & Plan Note (Signed)
Well controlled on current regimen. Renal function stable, no changes today.  Lab Results  Component Value Date   CREATININE 0.73 04/26/2015   Lab Results  Component Value Date   NA 138 04/26/2015   K 4.0 04/26/2015   CL 102 04/26/2015   CO2 31 04/26/2015

## 2015-04-28 NOTE — Assessment & Plan Note (Addendum)
With stage 2 ulcer.  Statin powder twice daily ,  Followed by triamcinolone. Pack area with gauze.

## 2015-04-29 ENCOUNTER — Encounter: Payer: Self-pay | Admitting: Internal Medicine

## 2015-05-16 ENCOUNTER — Other Ambulatory Visit: Payer: Self-pay | Admitting: Internal Medicine

## 2015-05-16 ENCOUNTER — Encounter: Payer: Self-pay | Admitting: Internal Medicine

## 2015-05-16 NOTE — Telephone Encounter (Signed)
I have electronically submitted pt's info for Prolia insurance verification and will notify you once I have a response. Thank you. °

## 2015-05-17 ENCOUNTER — Encounter: Payer: Self-pay | Admitting: Internal Medicine

## 2015-05-17 MED ORDER — ROSUVASTATIN CALCIUM 40 MG PO TABS: 40.0000 mg | ORAL_TABLET | Freq: Every day | ORAL | Status: DC

## 2015-05-17 NOTE — Telephone Encounter (Signed)
It is automatically refilled as generic   Doe not require new rx.  Does she want it sent to mail order or local?

## 2015-05-18 ENCOUNTER — Other Ambulatory Visit: Payer: Self-pay

## 2015-05-18 MED ORDER — ROSUVASTATIN CALCIUM 40 MG PO TABS: 40.0000 mg | ORAL_TABLET | Freq: Every day | ORAL | Status: DC

## 2015-05-21 ENCOUNTER — Encounter: Payer: Self-pay | Admitting: Internal Medicine

## 2015-05-21 ENCOUNTER — Other Ambulatory Visit: Payer: Self-pay | Admitting: Internal Medicine

## 2015-05-21 ENCOUNTER — Other Ambulatory Visit: Payer: Self-pay | Admitting: *Deleted

## 2015-05-21 ENCOUNTER — Other Ambulatory Visit: Payer: Self-pay | Admitting: Nurse Practitioner

## 2015-05-21 MED ORDER — CALCITONIN (SALMON) 200 UNIT/ACT NA SOLN: NASAL | Status: DC

## 2015-05-22 ENCOUNTER — Encounter: Payer: Self-pay | Admitting: Internal Medicine

## 2015-05-22 NOTE — Telephone Encounter (Signed)
I have rec'd Ms. Juarbe's insurance verification for Prolia and she has an estimated responsibility of 0% plus $183 deductible for a total of $183.  Please make pt aware this is an estimate and we will not know and exact amt until insurance(s) has/have paid.    If pt cannot afford $183 for her injection, please advise her to contact Prolia at [phone] and select option #1 to see if she qualifies for one of their assistance programs.  If she qualifies they will instruct her how to proceed.   Once pt recs injection, please let me know actual injection date so I can update the Prolia portal.  If you have any questions, please let me know.  Thank you.

## 2015-05-22 NOTE — Telephone Encounter (Signed)
Left a message for the patient to return a call to the office to discuss the Prolia determination.  Thanks

## 2015-05-23 ENCOUNTER — Telehealth: Payer: Self-pay | Admitting: *Deleted

## 2015-05-23 ENCOUNTER — Telehealth: Payer: Self-pay | Admitting: Internal Medicine

## 2015-05-23 ENCOUNTER — Encounter: Payer: Self-pay | Admitting: Internal Medicine

## 2015-05-23 MED ORDER — ATORVASTATIN CALCIUM 80 MG PO TABS: 80.0000 mg | ORAL_TABLET | Freq: Every day | ORAL | Status: DC

## 2015-05-23 NOTE — Telephone Encounter (Signed)
Patient insurance does not cover Crestor, but does cover Atorvastatin or simvastatin. If neither of this medications work [facility] needs to be called and a PA needs to be started. They need a answer by today.   Thank you.

## 2015-05-23 NOTE — Telephone Encounter (Signed)
Generic atorvastatin sent

## 2015-05-23 NOTE — Telephone Encounter (Signed)
See other note regarding Prolia

## 2015-05-23 NOTE — Telephone Encounter (Signed)
Please advise 

## 2015-05-23 NOTE — Telephone Encounter (Signed)
Patient has requested a call fron Lavella Lemons, she stated she missed a call, and it could have been in regards to an injection and/or medication.  Contact [phone]

## 2015-05-23 NOTE — Telephone Encounter (Signed)
[facility] contact ; [phone] Ref: [id number]

## 2015-05-24 ENCOUNTER — Other Ambulatory Visit: Payer: Self-pay

## 2015-05-24 ENCOUNTER — Encounter: Payer: Self-pay | Admitting: Internal Medicine

## 2015-05-24 MED ORDER — CALCITONIN (SALMON) 200 UNIT/ACT NA SOLN: NASAL | Status: DC

## 2015-05-24 NOTE — Telephone Encounter (Signed)
Scheduled patient for nurse visit and ordered Prolia injection.

## 2015-05-25 NOTE — Telephone Encounter (Signed)
Prolia in the fridge.

## 2015-05-29 ENCOUNTER — Ambulatory Visit (INDEPENDENT_AMBULATORY_CARE_PROVIDER_SITE_OTHER): Payer: Medicare Other

## 2015-05-29 DIAGNOSIS — M810 Age-related osteoporosis without current pathological fracture: Secondary | ICD-10-CM

## 2015-05-29 MED ORDER — ROSUVASTATIN CALCIUM 40 MG PO TABS: 40.0000 mg | ORAL_TABLET | Freq: Every day | ORAL | Status: DC

## 2015-05-29 MED ORDER — DENOSUMAB 60 MG/ML ~~LOC~~ SOLN
60.0000 mg | Freq: Once | SUBCUTANEOUS | Status: AC
  Administered 2015-05-29: 60 mg via SUBCUTANEOUS

## 2015-05-29 NOTE — Progress Notes (Signed)
Patient here today for Prolia injection. Given in left subQ, patient tolerated well.

## 2015-05-29 NOTE — Telephone Encounter (Signed)
Patient came in for Prolia injection.  Patient was very unhappy that prescription was changed to atorvastatin and she has been on crestor for years.  I don't see that a PA was done with the Crestor so I am resubmitting it as it was to see if we can get her back on the crestor.  (The atorvastatin is too big and she will not take it).  I will let you know the outcome.  Thanks

## 2015-05-29 NOTE — Addendum Note (Signed)
Addended by: Bevelyn Ngo on: 05/29/2015 12:00 PM   Modules accepted: Orders

## 2015-05-31 ENCOUNTER — Encounter: Payer: Self-pay | Admitting: Internal Medicine

## 2015-06-01 ENCOUNTER — Other Ambulatory Visit: Payer: Self-pay

## 2015-06-01 MED ORDER — CRESTOR 40 MG PO TABS: 40.0000 mg | ORAL_TABLET | Freq: Every day | ORAL | Status: DC

## 2015-06-15 NOTE — Telephone Encounter (Signed)
Received injection on 05/28/14.  thanks

## 2015-06-26 ENCOUNTER — Other Ambulatory Visit: Payer: Self-pay | Admitting: Internal Medicine

## 2015-07-02 ENCOUNTER — Other Ambulatory Visit: Payer: Self-pay | Admitting: Internal Medicine

## 2015-08-01 DIAGNOSIS — Z1231 Encounter for screening mammogram for malignant neoplasm of breast: Secondary | ICD-10-CM | POA: Diagnosis not present

## 2015-08-01 LAB — HM MAMMOGRAPHY: HM Mammogram: NEGATIVE

## 2015-08-03 ENCOUNTER — Encounter: Payer: Self-pay | Admitting: Internal Medicine

## 2015-08-07 ENCOUNTER — Telehealth: Payer: Self-pay | Admitting: Internal Medicine

## 2015-08-07 DIAGNOSIS — Z7289 Other problems related to lifestyle: Secondary | ICD-10-CM

## 2015-08-07 NOTE — Telephone Encounter (Signed)
Pt called about needing a Hep C Screening is pt due for the Hep C screening? If so order is needed.  Pt comes in 04/04. Thank you!

## 2015-08-07 NOTE — Telephone Encounter (Signed)
Please advise, thanks.

## 2015-08-10 NOTE — Telephone Encounter (Signed)
Pt is requesting to be tested for Hep C. Please advise, thanks

## 2015-08-10 NOTE — Telephone Encounter (Signed)
Screening for Hepatitis C and HIV  , as recommended by the CDC at least once for every adult over 65 , has been ordered.  She can wait until her next scheduled lab draw is needed . i n June

## 2015-08-10 NOTE — Telephone Encounter (Signed)
Mychart message sent.

## 2015-08-14 ENCOUNTER — Encounter: Payer: Self-pay | Admitting: Internal Medicine

## 2015-08-14 ENCOUNTER — Ambulatory Visit (INDEPENDENT_AMBULATORY_CARE_PROVIDER_SITE_OTHER): Payer: Medicare Other | Admitting: Internal Medicine

## 2015-08-14 ENCOUNTER — Other Ambulatory Visit (HOSPITAL_COMMUNITY)
Admission: RE | Admit: 2015-08-14 | Discharge: 2015-08-14 | Disposition: A | Discharge status: Home / Self Care | Payer: Medicare Other | Source: Ambulatory Visit | Attending: Internal Medicine | Admitting: Internal Medicine

## 2015-08-14 VITALS — BP 128/80 | HR 53 | Temp 97.9°F | Resp 12 | Ht 62.5 in | Wt 181.5 lb

## 2015-08-14 DIAGNOSIS — Z01419 Encounter for gynecological examination (general) (routine) without abnormal findings: Secondary | ICD-10-CM | POA: Diagnosis not present

## 2015-08-14 DIAGNOSIS — Z124 Encounter for screening for malignant neoplasm of cervix: Secondary | ICD-10-CM

## 2015-08-14 DIAGNOSIS — E785 Hyperlipidemia, unspecified: Secondary | ICD-10-CM

## 2015-08-14 DIAGNOSIS — R5383 Other fatigue: Secondary | ICD-10-CM

## 2015-08-14 DIAGNOSIS — Z0000 Encounter for general adult medical examination without abnormal findings: Secondary | ICD-10-CM

## 2015-08-14 DIAGNOSIS — E559 Vitamin D deficiency, unspecified: Secondary | ICD-10-CM | POA: Diagnosis not present

## 2015-08-14 DIAGNOSIS — Z1151 Encounter for screening for human papillomavirus (HPV): Secondary | ICD-10-CM | POA: Diagnosis not present

## 2015-08-14 DIAGNOSIS — Z85828 Personal history of other malignant neoplasm of skin: Secondary | ICD-10-CM

## 2015-08-14 DIAGNOSIS — M810 Age-related osteoporosis without current pathological fracture: Secondary | ICD-10-CM

## 2015-08-14 DIAGNOSIS — I10 Essential (primary) hypertension: Secondary | ICD-10-CM

## 2015-08-14 DIAGNOSIS — H9190 Unspecified hearing loss, unspecified ear: Secondary | ICD-10-CM

## 2015-08-14 NOTE — Progress Notes (Signed)
Pre-visit discussion using our clinic review tool. No additional management support is needed unless otherwise documented below in the visit note.  

## 2015-08-14 NOTE — Progress Notes (Signed)
Patient ID: Ellen Hamilton, female    DOB: 03/20/1951  Age: 65 y.o. MRN: [id number]  The patient is here for welcome to  Medicare wellness examination and management of other chronic and acute problems.   Had Prolia injection in January  Repeat DEXA 2017 Colonoscopy 2016 Mammogram March  2017 Needs dermatolgy eval saw Venita Lick last year  Wants to see Isenstein  Needs hearing test   audiology  Eye exam last year [facility]  After having bad experience with [facility].    The risk factors are reflected in the social history.  The roster of all physicians providing medical care to patient - is listed in the Snapshot section of the chart.  Activities of daily living:  The patient is 100% independent in all ADLs: dressing, toileting, feeding as well as independent mobility  Home safety : The patient has smoke detectors in the home. They wear seatbelts.  There are no firearms at home. There is no violence in the home.   There is no risks for hepatitis, STDs or HIV. There is no   history of blood transfusion. They have no travel history to infectious disease endemic areas of the world.  The patient has seen their dentist in the last six month. They have seen their eye doctor in the last year. They admit to slight hearing difficulty with regard to whispered voices and some television programs.  They have deferred audiologic testing in the last year.  They do not  have excessive sun exposure. Discussed the need for sun protection: hats, long sleeves and use of sunscreen if there is significant sun exposure.   Diet: the importance of a healthy diet is discussed. They do have a healthy diet.  The benefits of regular aerobic exercise were discussed. She walks 4 times per week ,  20 minutes.   Depression screen: there are no signs or vegative symptoms of depression- irritability, change in appetite, anhedonia, sadness/tearfullness.  Cognitive assessment: the patient manages all their financial  and personal affairs and is actively engaged. They could relate day,date,year and events; recalled 2/3 objects at 3 minutes; performed clock-face test normally.  The following portions of the patient's history were reviewed and updated as appropriate: allergies, current medications, past family history, past medical history,  past surgical history, past social history  and problem list.  Visual acuity was not assessed per patient preference since she has regular follow up with her ophthalmologist. Hearing and body mass index were assessed and reviewed.   During the course of the visit , End of Life objectives were discussed at length,  Patient does not have a living will in place or a healthcare power of attorney.  She was given printed information about advance directives and encouraged to return after discussing with her family,   During the course of the visit the patient was educated and counseled about appropriate screening and preventive services including : fall prevention , diabetes screening, nutrition counseling, colorectal cancer screening, and recommended immunizations.    CC: The primary encounter diagnosis was Cervical cancer screening. Diagnoses of Vitamin D deficiency, Other fatigue, Hyperlipidemia, and Medicare annual wellness visit, subsequent were also pertinent to this visit.  History Ellen Hamilton has a past medical history of Hypertension; Sleep apnea (2009); Fibromyalgia; Osteoporosis; Compression fracture of L5 lumbar vertebra ([place]) (01/2014); Stroke [facility]) [phone]); History of transient ischemic attack (TIA) (4356,8616); Hyperlipidemia; and GERD (gastroesophageal reflux disease).   She has past surgical history that includes Back surgery; kyphoplaasty (2010); Bunionectomy (  Bilateral, 2000,2001); Eye surgery; Cataract extraction w/ intraocular lens  implant, bilateral (2010); and Kyphoplasty (N/A, 02/13/2014).   Her family history includes COPD in her father; Cancer in her  father; Heart disease in her father; Mental illness in her maternal aunt and mother; Multiple sclerosis in her cousin. There is no history of Colon cancer.She reports that she has never smoked. She has never used smokeless tobacco. She reports that she drinks about 4.2 oz of alcohol per week. She reports that she does not use illicit drugs.  Outpatient Prescriptions Prior to Visit  Medication Sig Dispense Refill  . amLODipine (NORVASC) 5 MG tablet TAKE 1 TABLET DAILY 90 tablet 0  . aspirin 81 MG tablet Take 81 mg by mouth 2 (two) times daily.     Marland Kitchen CALCIUM CITRATE PO Take 500 mg by mouth 3 (three) times daily.    . cholecalciferol (VITAMIN D) 1000 UNITS tablet Take 1,000 Units by mouth daily.    . CRESTOR 40 MG tablet Take 1 tablet (40 mg total) by mouth daily. 90 tablet 3  . dipyridamole (PERSANTINE) 50 MG tablet TAKE 4 TABLETS (200 MG) TWICE A DAY 720 tablet 2  . fish oil-omega-3 fatty acids 1000 MG capsule Take 1,000 mg by mouth daily.    . metoprolol succinate (TOPROL-XL) 100 MG 24 hr tablet TAKE 1 TABLET DAILY 90 tablet 3  . Multiple Vitamin (MULTIVITAMIN) tablet Take 1 tablet by mouth daily.    . ranitidine (ZANTAC) 150 MG tablet TAKE ONE-HALF (1/2) TABLET (75 MG TOTAL) TWICE A DAY 180 tablet 0  . vitamin B-12 (CYANOCOBALAMIN) 1000 MCG tablet Take 1,000 mcg by mouth every other day.    Marland Kitchen acetaminophen (TYLENOL) 325 MG tablet Take 325 mg by mouth 4 (four) times daily as needed for moderate pain (take with methocarbamol). Reported on 08/14/2015    . atorvastatin (LIPITOR) 80 MG tablet Take 1 tablet (80 mg total) by mouth daily. 90 tablet 1  . calcitonin, salmon, (MIACALCIN/FORTICAL) 200 UNIT/ACT nasal spray USE ONE SPRAY IN NOSTRIL DAILY, ALTERNATE NOSTRILS EVERY OTHER DAY 3.7 mL 3  . Dalfampridine (AMPYRA PO) Take 7.5 mg by mouth 2 (two) times daily. Reported on 04/26/2015    . MOVIPREP 100 G SOLR Take 1 kit (200 g total) by mouth once. (Patient not taking: Reported on 04/26/2015) 1 kit 0  .  nystatin (MYCOSTATIN) powder Apply topically 2 (two) times daily. 15 g 3  . triamcinolone cream (KENALOG) 0.1 % Apply 1 application topically 2 (two) times daily. 30 g 0   No facility-administered medications prior to visit.    Review of Systems   Patient denies headache, fevers, malaise, unintentional weight loss, skin rash, eye pain, sinus congestion and sinus pain, sore throat, dysphagia,  hemoptysis , cough, dyspnea, wheezing, chest pain, palpitations, orthopnea, edema, abdominal pain, nausea, melena, diarrhea, constipation, flank pain, dysuria, hematuria, urinary  Frequency, nocturia, numbness, tingling, seizures,  Focal weakness, Loss of consciousness,  Tremor, insomnia, depression, anxiety, and suicidal ideation.      Objective:  BP 128/80 mmHg  Pulse 53  Temp(Src) 97.9 F (36.6 C) (Oral)  Resp 12  Ht 5' 2.5" (1.588 m)  Wt 181 lb 8 oz (82.328 kg)  BMI 32.65 kg/m2  SpO2 98%  Physical Exam   General Appearance:    Alert, cooperative, no distress, appears stated age  Head:    Normocephalic, without obvious abnormality, atraumatic  Eyes:    PERRL, conjunctiva/corneas clear, EOM's intact, fundi    benign, both eyes  Ears:    Normal TM's and external ear canals, both ears  Nose:   Nares normal, septum midline, mucosa normal, no drainage    or sinus tenderness  Throat:   Lips, mucosa, and tongue normal; teeth and gums normal  Neck:   Supple, symmetrical, trachea midline, no adenopathy;    thyroid:  no enlargement/tenderness/nodules; no carotid   bruit or JVD  Back:     Symmetric, no curvature, ROM normal, no CVA tenderness  Lungs:     Clear to auscultation bilaterally, respirations unlabored  Chest Wall:    No tenderness or deformity   Heart:    Regular rate and rhythm, S1 and S2 normal, no murmur, rub   or gallop  Breast Exam:    No tenderness, masses, or nipple abnormality  Abdomen:     Soft, non-tender, bowel sounds active all four quadrants,    no masses, no  organomegaly  Genitalia:    Pelvic: cervix normal in appearance, external genitalia normal, no adnexal masses or tenderness, no cervical motion tenderness, rectovaginal septum normal, uterus normal size, shape, and consistency and vagina normal without discharge  Extremities:   Extremities normal, atraumatic, no cyanosis or edema  Pulses:   2+ and symmetric all extremities  Skin:   Skin color, texture, turgor normal, no rashes or lesions  Lymph nodes:   Cervical, supraclavicular, and axillary nodes normal  Neurologic:   CNII-XII intact, normal strength distally,  Mild loss of strength hip flrexors, sensation and reflexes   throughout      Assessment & Plan:   Problem List Items Addressed This Visit    Medicare annual wellness visit, subsequent    Annual Medicare wellness  exam was done as well as a comprehensive physical exam and management of acute and chronic conditions .  During the course of the visit the patient was educated and counseled about appropriate screening and preventive services including : fall prevention , diabetes screening, nutrition counseling, colorectal cancer screening, and recommended immunizations.  Printed recommendations for health maintenance screenings was given.       Hyperlipidemia   Relevant Orders   Lipid panel    Other Visit Diagnoses    Cervical cancer screening    -  Primary    Relevant Orders    Cytology - PAP    Vitamin D deficiency        Relevant Orders    VITAMIN D 25 Hydroxy (Vit-D Deficiency, Fractures)    Other fatigue        Relevant Orders    Comprehensive metabolic panel    TSH    CBC with Differential/Platelet       I have discontinued Ms. Caetano's acetaminophen, Dalfampridine (AMPYRA PO), MOVIPREP, triamcinolone cream, nystatin, and atorvastatin. I am also having her maintain her aspirin, vitamin B-12, cholecalciferol, fish oil-omega-3 fatty acids, multivitamin, CALCIUM CITRATE PO, dipyridamole, ranitidine, CRESTOR, metoprolol  succinate, amLODipine, and calcitonin (salmon).  Meds ordered this encounter  Medications  . calcitonin, salmon, (MIACALCIN/FORTICAL) 200 UNIT/ACT nasal spray    Sig: Reported on 08/14/2015    Medications Discontinued During This Encounter  Medication Reason  . acetaminophen (TYLENOL) 325 MG tablet Patient Preference  . atorvastatin (LIPITOR) 80 MG tablet Change in therapy  . calcitonin, salmon, (MIACALCIN/FORTICAL) 200 UNIT/ACT nasal spray Completed Course  . Dalfampridine (AMPYRA PO) Completed Course  . MOVIPREP 100 G SOLR Completed Course  . nystatin (MYCOSTATIN) powder Completed Course  . triamcinolone cream (KENALOG) 0.1 % Completed Course    Follow-up:  Return in about 6 months (around 02/13/2016) for fasting labs soon ; .   Crecencio Mc, MD

## 2015-08-14 NOTE — Patient Instructions (Addendum)
You are doing very well!  It was good to see you  We will make the referral to Dr Kellie Moor for your skin check   Your next Prolia shot will be due in July,  And your bone density test in December  Please return for fasting labs at your convenience   Menopause is a normal process in which your reproductive ability comes to an end. This process happens gradually over a span of months to years, usually between the ages of 28 and 65. Menopause is complete when you have missed 12 consecutive menstrual periods. It is important to talk with your health care provider about some of the most common conditions that affect postmenopausal women, such as heart disease, cancer, and bone loss (osteoporosis). Adopting a healthy lifestyle and getting preventive care can help to promote your health and wellness. Those actions can also lower your chances of developing some of these common conditions. WHAT SHOULD I KNOW ABOUT MENOPAUSE? During menopause, you may experience a number of symptoms, such as:  Moderate-to-severe hot flashes.  Night sweats.  Decrease in sex drive.  Mood swings.  Headaches.  Tiredness.  Irritability.  Memory problems.  Insomnia. Choosing to treat or not to treat menopausal changes is an individual decision that you make with your health care provider. WHAT SHOULD I KNOW ABOUT HORMONE REPLACEMENT THERAPY AND SUPPLEMENTS? Hormone therapy products are effective for treating symptoms that are associated with menopause, such as hot flashes and night sweats. Hormone replacement carries certain risks, especially as you become older. If you are thinking about using estrogen or estrogen with progestin treatments, discuss the benefits and risks with your health care provider. WHAT SHOULD I KNOW ABOUT HEART DISEASE AND STROKE? Heart disease, heart attack, and stroke become more likely as you age. This may be due, in part, to the hormonal changes that your body experiences during  menopause. These can affect how your body processes dietary fats, triglycerides, and cholesterol. Heart attack and stroke are both medical emergencies. There are many things that you can do to help prevent heart disease and stroke:  Have your blood pressure checked at least every 1-2 years. High blood pressure causes heart disease and increases the risk of stroke.  If you are 95-76 years old, ask your health care provider if you should take aspirin to prevent a heart attack or a stroke.  Do not use any tobacco products, including cigarettes, chewing tobacco, or electronic cigarettes. If you need help quitting, ask your health care provider.  It is important to eat a healthy diet and maintain a healthy weight.  Be sure to include plenty of vegetables, fruits, low-fat dairy products, and lean protein.  Avoid eating foods that are high in solid fats, added sugars, or salt (sodium).  Get regular exercise. This is one of the most important things that you can do for your health.  Try to exercise for at least 150 minutes each week. The type of exercise that you do should increase your heart rate and make you sweat. This is known as moderate-intensity exercise.  Try to do strengthening exercises at least twice each week. Do these in addition to the moderate-intensity exercise.  Know your numbers.Ask your health care provider to check your cholesterol and your blood glucose. Continue to have your blood tested as directed by your health care provider. WHAT SHOULD I KNOW ABOUT CANCER SCREENING? There are several types of cancer. Take the following steps to reduce your risk and to catch any cancer  development as early as possible. Breast Cancer  Practice breast self-awareness.  This means understanding how your breasts normally appear and feel.  It also means doing regular breast self-exams. Let your health care provider know about any changes, no matter how small.  If you are 61 or older, have  a clinician do a breast exam (clinical breast exam or CBE) every year. Depending on your age, family history, and medical history, it may be recommended that you also have a yearly breast X-ray (mammogram).  If you have a family history of breast cancer, talk with your health care provider about genetic screening.  If you are at high risk for breast cancer, talk with your health care provider about having an MRI and a mammogram every year.  Breast cancer (BRCA) gene test is recommended for women who have family members with BRCA-related cancers. Results of the assessment will determine the need for genetic counseling and BRCA1 and for BRCA2 testing. BRCA-related cancers include these types:  Breast. This occurs in males or females.  Ovarian.  Tubal. This may also be called fallopian tube cancer.  Cancer of the abdominal or pelvic lining (peritoneal cancer).  Prostate.  Pancreatic. Cervical, Uterine, and Ovarian Cancer Your health care provider may recommend that you be screened regularly for cancer of the pelvic organs. These include your ovaries, uterus, and vagina. This screening involves a pelvic exam, which includes checking for microscopic changes to the surface of your cervix (Pap test).  For women ages 21-65, health care providers may recommend a pelvic exam and a Pap test every three years. For women ages 31-65, they may recommend the Pap test and pelvic exam, combined with testing for human papilloma virus (HPV), every five years. Some types of HPV increase your risk of cervical cancer. Testing for HPV may also be done on women of any age who have unclear Pap test results.  Other health care providers may not recommend any screening for nonpregnant women who are considered low risk for pelvic cancer and have no symptoms. Ask your health care provider if a screening pelvic exam is right for you.  If you have had past treatment for cervical cancer or a condition that could lead to  cancer, you need Pap tests and screening for cancer for at least 20 years after your treatment. If Pap tests have been discontinued for you, your risk factors (such as having a new sexual partner) need to be reassessed to determine if you should start having screenings again. Some women have medical problems that increase the chance of getting cervical cancer. In these cases, your health care provider may recommend that you have screening and Pap tests more often.  If you have a family history of uterine cancer or ovarian cancer, talk with your health care provider about genetic screening.  If you have vaginal bleeding after reaching menopause, tell your health care provider.  There are currently no reliable tests available to screen for ovarian cancer. Lung Cancer Lung cancer screening is recommended for adults 60-40 years old who are at high risk for lung cancer because of a history of smoking. A yearly low-dose CT scan of the lungs is recommended if you:  Currently smoke.  Have a history of at least 30 pack-years of smoking and you currently smoke or have quit within the past 15 years. A pack-year is smoking an average of one pack of cigarettes per day for one year. Yearly screening should:  Continue until it has been 15  years since you quit.  Stop if you develop a health problem that would prevent you from having lung cancer treatment. Colorectal Cancer  This type of cancer can be detected and can often be prevented.  Routine colorectal cancer screening usually begins at age 60 and continues through age 72.  If you have risk factors for colon cancer, your health care provider may recommend that you be screened at an earlier age.  If you have a family history of colorectal cancer, talk with your health care provider about genetic screening.  Your health care provider may also recommend using home test kits to check for hidden blood in your stool.  A small camera at the end of a tube  can be used to examine your colon directly (sigmoidoscopy or colonoscopy). This is done to check for the earliest forms of colorectal cancer.  Direct examination of the colon should be repeated every 5-10 years until age 91. However, if early forms of precancerous polyps or small growths are found or if you have a family history or genetic risk for colorectal cancer, you may need to be screened more often. Skin Cancer  Check your skin from head to toe regularly.  Monitor any moles. Be sure to tell your health care provider:  About any new moles or changes in moles, especially if there is a change in a mole's shape or color.  If you have a mole that is larger than the size of a pencil eraser.  If any of your family members has a history of skin cancer, especially at a young age, talk with your health care provider about genetic screening.  Always use sunscreen. Apply sunscreen liberally and repeatedly throughout the day.  Whenever you are outside, protect yourself by wearing long sleeves, pants, a wide-brimmed hat, and sunglasses. WHAT SHOULD I KNOW ABOUT OSTEOPOROSIS? Osteoporosis is a condition in which bone destruction happens more quickly than new bone creation. After menopause, you may be at an increased risk for osteoporosis. To help prevent osteoporosis or the bone fractures that can happen because of osteoporosis, the following is recommended:  If you are 89-30 years old, get at least 1,000 mg of calcium and at least 600 mg of vitamin D per day.  If you are older than age 52 but younger than age 40, get at least 1,200 mg of calcium and at least 600 mg of vitamin D per day.  If you are older than age 59, get at least 1,200 mg of calcium and at least 800 mg of vitamin D per day. Smoking and excessive alcohol intake increase the risk of osteoporosis. Eat foods that are rich in calcium and vitamin D, and do weight-bearing exercises several times each week as directed by your health care  provider. WHAT SHOULD I KNOW ABOUT HOW MENOPAUSE AFFECTS [place]? Depression may occur at any age, but it is more common as you become older. Common symptoms of depression include:  Low or sad mood.  Changes in sleep patterns.  Changes in appetite or eating patterns.  Feeling an overall lack of motivation or enjoyment of activities that you previously enjoyed.  Frequent crying spells. Talk with your health care provider if you think that you are experiencing depression. WHAT SHOULD I KNOW ABOUT IMMUNIZATIONS? It is important that you get and maintain your immunizations. These include:  Tetanus, diphtheria, and pertussis (Tdap) booster vaccine.  Influenza every year before the flu season begins.  Pneumonia vaccine.  Shingles vaccine. Your health  care provider may also recommend other immunizations.   This information is not intended to replace advice given to you by your health care provider. Make sure you discuss any questions you have with your health care provider.   Document Released: 06/20/2005 Document Revised: 05/19/2014 Document Reviewed: 12/29/2013 Elsevier Interactive Patient Education [employer].  .

## 2015-08-15 NOTE — Assessment & Plan Note (Signed)
Managed with Crestor 40 mg daily.  LFTs normal in December .  No changes today   Lab Results  Component Value Date   CHOL 116 04/19/2014   HDL 46 04/19/2014   LDLCALC 50 04/19/2014   TRIG 101 04/19/2014   CHOLHDL 3 09/09/2013   Lab Results  Component Value Date   ALT 16 04/26/2015   AST 26 04/26/2015   ALKPHOS 47 04/26/2015   BILITOT 0.6 04/26/2015

## 2015-08-15 NOTE — Assessment & Plan Note (Signed)
Well controlled on current regimen. Renal function stable, no changes today.  Lab Results  Component Value Date   CREATININE 0.73 04/26/2015   Lab Results  Component Value Date   NA 138 04/26/2015   K 4.0 04/26/2015   CL 102 04/26/2015   CO2 31 04/26/2015

## 2015-08-15 NOTE — Assessment & Plan Note (Signed)
With prior vertebral fractures.  Now taking Prolia,  Next dose is due in July.

## 2015-08-15 NOTE — Assessment & Plan Note (Signed)

## 2015-08-16 ENCOUNTER — Encounter: Payer: Self-pay | Admitting: Internal Medicine

## 2015-08-16 LAB — CYTOLOGY - PAP

## 2015-08-20 ENCOUNTER — Other Ambulatory Visit (INDEPENDENT_AMBULATORY_CARE_PROVIDER_SITE_OTHER): Payer: Medicare Other

## 2015-08-20 DIAGNOSIS — R5383 Other fatigue: Secondary | ICD-10-CM

## 2015-08-20 DIAGNOSIS — R748 Abnormal levels of other serum enzymes: Secondary | ICD-10-CM | POA: Diagnosis not present

## 2015-08-20 DIAGNOSIS — E559 Vitamin D deficiency, unspecified: Secondary | ICD-10-CM | POA: Diagnosis not present

## 2015-08-20 DIAGNOSIS — E785 Hyperlipidemia, unspecified: Secondary | ICD-10-CM

## 2015-08-20 DIAGNOSIS — Z7289 Other problems related to lifestyle: Secondary | ICD-10-CM

## 2015-08-20 LAB — COMPREHENSIVE METABOLIC PANEL
ALT: 13 U/L (ref 0–35)
AST: 21 U/L (ref 0–37)
Albumin: 4.5 g/dL (ref 3.5–5.2)
Alkaline Phosphatase: 37 U/L — ABNORMAL LOW (ref 39–117)
BILIRUBIN TOTAL: 0.6 mg/dL (ref 0.2–1.2)
BUN: 13 mg/dL (ref 6–23)
CALCIUM: 9.6 mg/dL (ref 8.4–10.5)
CHLORIDE: 105 meq/L (ref 96–112)
CO2: 29 meq/L (ref 19–32)
Creatinine, Ser: 0.71 mg/dL (ref 0.40–1.20)
GFR: 87.57 mL/min (ref >60.00)
GLUCOSE: 97 mg/dL (ref 70–99)
Potassium: 4 mEq/L (ref 3.5–5.1)
Sodium: 142 mEq/L (ref 135–145)
Total Protein: 7.3 g/dL (ref 6.0–8.3)

## 2015-08-20 LAB — CBC WITH DIFFERENTIAL/PLATELET
BASOS ABS: 0 10*3/uL (ref 0.0–0.1)
Basophils Relative: 0.8 % (ref 0.0–3.0)
EOS ABS: 0.2 10*3/uL (ref 0.0–0.7)
Eosinophils Relative: 5.2 % — ABNORMAL HIGH (ref 0.0–5.0)
HEMATOCRIT: 39.8 % (ref 36.0–46.0)
Hemoglobin: 13.6 g/dL (ref 12.0–15.0)
LYMPHS PCT: 29.5 % (ref 12.0–46.0)
Lymphs Abs: 1.1 10*3/uL (ref 0.7–4.0)
MCHC: 34 g/dL (ref 30.0–36.0)
MCV: 94 fl (ref 78.0–100.0)
MONOS PCT: 9.8 % (ref 3.0–12.0)
Monocytes Absolute: 0.4 10*3/uL (ref 0.1–1.0)
Neutro Abs: 2.1 10*3/uL (ref 1.4–7.7)
Neutrophils Relative %: 54.7 % (ref 43.0–77.0)
Platelets: 179 10*3/uL (ref 150.0–400.0)
RBC: 4.24 Mil/uL (ref 3.87–5.11)
RDW: 13.7 % (ref 11.5–15.5)
WBC: 3.9 10*3/uL — AB (ref 4.0–10.5)

## 2015-08-20 LAB — HIV ANTIBODY (ROUTINE TESTING W REFLEX): HIV: NONREACTIVE

## 2015-08-20 LAB — LIPID PANEL
CHOL/HDL RATIO: 3
Cholesterol: 115 mg/dL (ref 0–200)
HDL: 43.6 mg/dL (ref >39.00)
LDL Cholesterol: 51 mg/dL (ref 0–99)
NONHDL: 71.19
TRIGLYCERIDES: 102 mg/dL (ref 0.0–149.0)
VLDL: 20.4 mg/dL (ref 0.0–40.0)

## 2015-08-20 LAB — TSH: TSH: 4.6 u[IU]/mL — AB (ref 0.35–4.50)

## 2015-08-20 LAB — HEPATITIS C ANTIBODY: HCV AB: NEGATIVE

## 2015-08-20 LAB — VITAMIN D 25 HYDROXY (VIT D DEFICIENCY, FRACTURES): VITD: 51.76 ng/mL (ref 30.00–100.00)

## 2015-08-22 ENCOUNTER — Encounter: Payer: Self-pay | Admitting: Internal Medicine

## 2015-08-22 ENCOUNTER — Other Ambulatory Visit: Payer: Self-pay | Admitting: Internal Medicine

## 2015-08-22 DIAGNOSIS — R7989 Other specified abnormal findings of blood chemistry: Secondary | ICD-10-CM

## 2015-08-22 LAB — HCV RNA QUANT: Hepatitis C Quantitation: NOT DETECTED IU/mL

## 2015-08-28 ENCOUNTER — Telehealth: Payer: Self-pay | Admitting: Internal Medicine

## 2015-08-28 NOTE — Telephone Encounter (Signed)
No message in this note Rose.

## 2015-08-28 NOTE — Telephone Encounter (Signed)
I think we have a Prolia world record here, I have got the insurance verification back on the same day.  Ellen Hamilton has an estimated responsibility of 0% plus $183 deductible (if not met).  Please make pt aware this is an estimate and we will not know an exact amt until insurance(s) has/have paid.  I have sent a copy of the summary of benefits to be scanned into pt's chart.    If pt cannot afford up to a possible $183 for her injection, please advise her to contact [employer] at [phone] and select option #1 to see if she qualifies for one of their assistance programs.  If she qualifies they will instruct her how to proceed.  If you have any questions, please let me know. Thank you.  Once pt recs injection, please let me know actual injection date so I can update the [employer] portal.  If you have any questions, please let me know.

## 2015-08-28 NOTE — Telephone Encounter (Signed)
Noted  

## 2015-08-28 NOTE — Telephone Encounter (Signed)
I'm so sorry!  I have electronically submitted pt's info for [place] verification and will notify you once I have a response. Thank you.

## 2015-08-28 NOTE — Telephone Encounter (Signed)
OK TO FORWARD  Roses's message to Ms Bolejack

## 2015-08-29 NOTE — Telephone Encounter (Signed)
Patient notified [facility] chart

## 2015-08-30 ENCOUNTER — Telehealth: Payer: Self-pay | Admitting: Internal Medicine

## 2015-08-30 NOTE — Telephone Encounter (Signed)
Ordered in Dimension [facility].

## 2015-08-30 NOTE — Telephone Encounter (Signed)
Please order Prolia for patient she would like to proceed. Patient has Biochemist, clinical

## 2015-08-31 NOTE — Telephone Encounter (Signed)
Prolia is in the Fridge and appt scheduled for July. thanks

## 2015-09-22 ENCOUNTER — Other Ambulatory Visit: Payer: Self-pay | Admitting: Internal Medicine

## 2015-10-01 ENCOUNTER — Other Ambulatory Visit (INDEPENDENT_AMBULATORY_CARE_PROVIDER_SITE_OTHER): Payer: Medicare Other

## 2015-10-01 DIAGNOSIS — R946 Abnormal results of thyroid function studies: Secondary | ICD-10-CM | POA: Diagnosis not present

## 2015-10-01 DIAGNOSIS — R7989 Other specified abnormal findings of blood chemistry: Secondary | ICD-10-CM

## 2015-10-01 LAB — TSH: TSH: 4.26 u[IU]/mL (ref 0.35–4.50)

## 2015-10-02 LAB — T4: T4 TOTAL: 6.3 ug/dL (ref 4.5–12.0)

## 2015-10-03 ENCOUNTER — Encounter: Payer: Self-pay | Admitting: Internal Medicine

## 2015-10-18 DIAGNOSIS — L821 Other seborrheic keratosis: Secondary | ICD-10-CM | POA: Diagnosis not present

## 2015-10-18 DIAGNOSIS — L918 Other hypertrophic disorders of the skin: Secondary | ICD-10-CM | POA: Diagnosis not present

## 2015-10-18 DIAGNOSIS — J309 Allergic rhinitis, unspecified: Secondary | ICD-10-CM | POA: Insufficient documentation

## 2015-10-18 DIAGNOSIS — M797 Fibromyalgia: Secondary | ICD-10-CM | POA: Insufficient documentation

## 2015-10-18 DIAGNOSIS — S22000A Wedge compression fracture of unspecified thoracic vertebra, initial encounter for closed fracture: Secondary | ICD-10-CM | POA: Insufficient documentation

## 2015-10-18 DIAGNOSIS — L820 Inflamed seborrheic keratosis: Secondary | ICD-10-CM | POA: Diagnosis not present

## 2015-10-18 DIAGNOSIS — D229 Melanocytic nevi, unspecified: Secondary | ICD-10-CM | POA: Diagnosis not present

## 2015-10-18 DIAGNOSIS — I639 Cerebral infarction, unspecified: Secondary | ICD-10-CM | POA: Insufficient documentation

## 2015-10-18 DIAGNOSIS — K209 Esophagitis, unspecified without bleeding: Secondary | ICD-10-CM | POA: Insufficient documentation

## 2015-10-18 DIAGNOSIS — L719 Rosacea, unspecified: Secondary | ICD-10-CM | POA: Insufficient documentation

## 2015-10-18 DIAGNOSIS — K5730 Diverticulosis of large intestine without perforation or abscess without bleeding: Secondary | ICD-10-CM | POA: Insufficient documentation

## 2015-10-18 DIAGNOSIS — E669 Obesity, unspecified: Secondary | ICD-10-CM | POA: Insufficient documentation

## 2015-11-05 ENCOUNTER — Other Ambulatory Visit: Payer: Medicare Other

## 2015-11-19 ENCOUNTER — Other Ambulatory Visit: Payer: Self-pay | Admitting: Internal Medicine

## 2015-11-22 ENCOUNTER — Ambulatory Visit (INDEPENDENT_AMBULATORY_CARE_PROVIDER_SITE_OTHER): Payer: Medicare Other

## 2015-11-22 DIAGNOSIS — M810 Age-related osteoporosis without current pathological fracture: Secondary | ICD-10-CM | POA: Diagnosis not present

## 2015-11-22 MED ORDER — DENOSUMAB 60 MG/ML ~~LOC~~ SOLN
60.0000 mg | Freq: Once | SUBCUTANEOUS | Status: AC
  Administered 2015-11-22: 60 mg via SUBCUTANEOUS

## 2015-11-22 NOTE — Telephone Encounter (Signed)
Patient received injection 11/22/2015, thanks

## 2015-11-22 NOTE — Progress Notes (Signed)
Patient came in for Prolia injection, received in right arm subcutaneously.  Patient tolerated well.

## 2015-12-23 ENCOUNTER — Encounter: Payer: Self-pay | Admitting: Internal Medicine

## 2015-12-24 ENCOUNTER — Other Ambulatory Visit: Payer: Self-pay

## 2015-12-24 MED ORDER — ROSUVASTATIN CALCIUM 40 MG PO TABS: 40.0000 mg | ORAL_TABLET | Freq: Every day | ORAL | 3 refills | Status: DC

## 2015-12-25 ENCOUNTER — Other Ambulatory Visit: Payer: Self-pay | Admitting: Internal Medicine

## 2015-12-25 NOTE — Telephone Encounter (Signed)
Last fill was on 04/18/15 for 720 tablets with 2 refills, LOV 08/14/15. Azalee Course, RMA

## 2016-01-10 DIAGNOSIS — Z79899 Other long term (current) drug therapy: Secondary | ICD-10-CM | POA: Diagnosis not present

## 2016-01-10 DIAGNOSIS — I10 Essential (primary) hypertension: Secondary | ICD-10-CM | POA: Diagnosis not present

## 2016-01-10 DIAGNOSIS — Z5321 Procedure and treatment not carried out due to patient leaving prior to being seen by health care provider: Secondary | ICD-10-CM | POA: Insufficient documentation

## 2016-01-10 DIAGNOSIS — Z7982 Long term (current) use of aspirin: Secondary | ICD-10-CM | POA: Insufficient documentation

## 2016-01-10 DIAGNOSIS — R1031 Right lower quadrant pain: Secondary | ICD-10-CM | POA: Diagnosis present

## 2016-01-10 LAB — CBC
HEMATOCRIT: 41.4 % (ref 35.0–47.0)
HEMOGLOBIN: 14.7 g/dL (ref 12.0–16.0)
MCH: 32.9 pg (ref 26.0–34.0)
MCHC: 35.4 g/dL (ref 32.0–36.0)
MCV: 92.9 fL (ref 80.0–100.0)
Platelets: 169 10*3/uL (ref 150–440)
RBC: 4.46 MIL/uL (ref 3.80–5.20)
RDW: 13.1 % (ref 11.5–14.5)
WBC: 9.5 10*3/uL (ref 3.6–11.0)

## 2016-01-10 NOTE — ED Triage Notes (Signed)
Pt to triage via w/c with no distress noted; c/o right lower abd pain tonight with no accomp symptoms; denies hx of same

## 2016-01-11 ENCOUNTER — Observation Stay: Payer: Medicare Other | Admitting: Anesthesiology

## 2016-01-11 ENCOUNTER — Telehealth: Payer: Self-pay | Admitting: *Deleted

## 2016-01-11 ENCOUNTER — Observation Stay
Admission: EM | Admit: 2016-01-11 | Discharge: 2016-01-12 | Disposition: A | Discharge status: Home / Self Care | Payer: Medicare Other | Attending: Surgery | Admitting: Surgery

## 2016-01-11 ENCOUNTER — Encounter: Payer: Self-pay | Admitting: Internal Medicine

## 2016-01-11 ENCOUNTER — Emergency Department
Admission: EM | Admit: 2016-01-11 | Discharge: 2016-01-11 | Disposition: A | Discharge status: Home / Self Care | Payer: Medicare Other | Attending: Emergency Medicine | Admitting: Emergency Medicine

## 2016-01-11 ENCOUNTER — Encounter: Payer: Self-pay | Admitting: Emergency Medicine

## 2016-01-11 ENCOUNTER — Emergency Department: Payer: Medicare Other

## 2016-01-11 ENCOUNTER — Encounter
Admission: EM | Disposition: A | Discharge status: Home / Self Care | Payer: Self-pay | Source: Home / Self Care | Attending: Emergency Medicine

## 2016-01-11 DIAGNOSIS — Z9842 Cataract extraction status, left eye: Secondary | ICD-10-CM | POA: Insufficient documentation

## 2016-01-11 DIAGNOSIS — K3580 Unspecified acute appendicitis: Secondary | ICD-10-CM | POA: Diagnosis not present

## 2016-01-11 DIAGNOSIS — I700 Atherosclerosis of aorta: Secondary | ICD-10-CM | POA: Diagnosis not present

## 2016-01-11 DIAGNOSIS — Z825 Family history of asthma and other chronic lower respiratory diseases: Secondary | ICD-10-CM | POA: Insufficient documentation

## 2016-01-11 DIAGNOSIS — Z9841 Cataract extraction status, right eye: Secondary | ICD-10-CM | POA: Insufficient documentation

## 2016-01-11 DIAGNOSIS — K352 Acute appendicitis with generalized peritonitis, without abscess: Secondary | ICD-10-CM

## 2016-01-11 DIAGNOSIS — E278 Other specified disorders of adrenal gland: Secondary | ICD-10-CM | POA: Insufficient documentation

## 2016-01-11 DIAGNOSIS — K7689 Other specified diseases of liver: Secondary | ICD-10-CM | POA: Diagnosis not present

## 2016-01-11 DIAGNOSIS — N281 Cyst of kidney, acquired: Secondary | ICD-10-CM | POA: Insufficient documentation

## 2016-01-11 DIAGNOSIS — Z885 Allergy status to narcotic agent status: Secondary | ICD-10-CM | POA: Insufficient documentation

## 2016-01-11 DIAGNOSIS — K219 Gastro-esophageal reflux disease without esophagitis: Secondary | ICD-10-CM | POA: Diagnosis not present

## 2016-01-11 DIAGNOSIS — Z9889 Other specified postprocedural states: Secondary | ICD-10-CM | POA: Insufficient documentation

## 2016-01-11 DIAGNOSIS — Z79899 Other long term (current) drug therapy: Secondary | ICD-10-CM | POA: Diagnosis not present

## 2016-01-11 DIAGNOSIS — Z818 Family history of other mental and behavioral disorders: Secondary | ICD-10-CM | POA: Diagnosis not present

## 2016-01-11 DIAGNOSIS — I119 Hypertensive heart disease without heart failure: Secondary | ICD-10-CM | POA: Insufficient documentation

## 2016-01-11 DIAGNOSIS — E785 Hyperlipidemia, unspecified: Secondary | ICD-10-CM | POA: Insufficient documentation

## 2016-01-11 DIAGNOSIS — G4730 Sleep apnea, unspecified: Secondary | ICD-10-CM | POA: Diagnosis not present

## 2016-01-11 DIAGNOSIS — Z8673 Personal history of transient ischemic attack (TIA), and cerebral infarction without residual deficits: Secondary | ICD-10-CM | POA: Diagnosis not present

## 2016-01-11 DIAGNOSIS — Z8249 Family history of ischemic heart disease and other diseases of the circulatory system: Secondary | ICD-10-CM | POA: Diagnosis not present

## 2016-01-11 DIAGNOSIS — Z820 Family history of epilepsy and other diseases of the nervous system: Secondary | ICD-10-CM | POA: Insufficient documentation

## 2016-01-11 DIAGNOSIS — Z7982 Long term (current) use of aspirin: Secondary | ICD-10-CM | POA: Insufficient documentation

## 2016-01-11 DIAGNOSIS — Z9049 Acquired absence of other specified parts of digestive tract: Secondary | ICD-10-CM | POA: Insufficient documentation

## 2016-01-11 DIAGNOSIS — Z880 Allergy status to penicillin: Secondary | ICD-10-CM | POA: Insufficient documentation

## 2016-01-11 DIAGNOSIS — M797 Fibromyalgia: Secondary | ICD-10-CM | POA: Insufficient documentation

## 2016-01-11 DIAGNOSIS — M810 Age-related osteoporosis without current pathological fracture: Secondary | ICD-10-CM | POA: Insufficient documentation

## 2016-01-11 DIAGNOSIS — I10 Essential (primary) hypertension: Secondary | ICD-10-CM | POA: Diagnosis not present

## 2016-01-11 DIAGNOSIS — I2510 Atherosclerotic heart disease of native coronary artery without angina pectoris: Secondary | ICD-10-CM | POA: Diagnosis not present

## 2016-01-11 DIAGNOSIS — Z883 Allergy status to other anti-infective agents status: Secondary | ICD-10-CM | POA: Diagnosis not present

## 2016-01-11 HISTORY — PX: LAPAROSCOPIC APPENDECTOMY: SHX408

## 2016-01-11 LAB — CBC
HCT: 39.2 % (ref 35.0–47.0)
HEMOGLOBIN: 13.6 g/dL (ref 12.0–16.0)
MCH: 32.8 pg (ref 26.0–34.0)
MCHC: 34.7 g/dL (ref 32.0–36.0)
MCV: 94.6 fL (ref 80.0–100.0)
Platelets: 167 10*3/uL (ref 150–440)
RBC: 4.14 MIL/uL (ref 3.80–5.20)
RDW: 13 % (ref 11.5–14.5)
WBC: 6.6 10*3/uL (ref 3.6–11.0)

## 2016-01-11 LAB — COMPREHENSIVE METABOLIC PANEL
ALBUMIN: 4.9 g/dL (ref 3.5–5.0)
ALK PHOS: 39 U/L (ref 38–126)
ALT: 15 U/L (ref 14–54)
ANION GAP: 9 (ref 5–15)
AST: 28 U/L (ref 15–41)
BUN: 13 mg/dL (ref 6–20)
CO2: 25 mmol/L (ref 22–32)
Calcium: 9.7 mg/dL (ref 8.9–10.3)
Chloride: 103 mmol/L (ref 101–111)
Creatinine, Ser: 0.66 mg/dL (ref 0.44–1.00)
GFR calc Af Amer: >60 mL/min (ref >60)
GFR calc non Af Amer: >60 mL/min (ref >60)
GLUCOSE: 108 mg/dL — AB (ref 65–99)
POTASSIUM: 3.9 mmol/L (ref 3.5–5.1)
SODIUM: 137 mmol/L (ref 135–145)
Total Bilirubin: 0.4 mg/dL (ref 0.3–1.2)
Total Protein: 8.3 g/dL — ABNORMAL HIGH (ref 6.5–8.1)

## 2016-01-11 LAB — CREATININE, SERUM: CREATININE: 0.59 mg/dL (ref 0.44–1.00)

## 2016-01-11 LAB — LIPASE, BLOOD: Lipase: 27 U/L (ref 11–51)

## 2016-01-11 SURGERY — APPENDECTOMY, LAPAROSCOPIC
Anesthesia: General | Wound class: Clean Contaminated

## 2016-01-11 MED ORDER — HEPARIN SODIUM (PORCINE) 5000 UNIT/ML IJ SOLN
5000.0000 [IU] | Freq: Three times a day (TID) | INTRAMUSCULAR | Status: DC
  Administered 2016-01-11 – 2016-01-12 (×3): 5000 [IU] via SUBCUTANEOUS
  Filled 2016-01-11 (×2): qty 1

## 2016-01-11 MED ORDER — FENTANYL CITRATE (PF) 100 MCG/2ML IJ SOLN
INTRAMUSCULAR | Status: DC | PRN
  Administered 2016-01-11: 100 ug via INTRAVENOUS

## 2016-01-11 MED ORDER — METRONIDAZOLE IN NACL 5-0.79 MG/ML-% IV SOLN
500.0000 mg | Freq: Three times a day (TID) | INTRAVENOUS | Status: DC
  Administered 2016-01-11: 500 mg via INTRAVENOUS
  Filled 2016-01-11 (×3): qty 100

## 2016-01-11 MED ORDER — SUGAMMADEX SODIUM 500 MG/5ML IV SOLN
INTRAVENOUS | Status: DC | PRN
  Administered 2016-01-11: 300 mg via INTRAVENOUS

## 2016-01-11 MED ORDER — LACTATED RINGERS IV SOLN
INTRAVENOUS | Status: DC | PRN
  Administered 2016-01-11: 23:00:00 via INTRAVENOUS

## 2016-01-11 MED ORDER — LIDOCAINE HCL (CARDIAC) 20 MG/ML IV SOLN
INTRAVENOUS | Status: DC | PRN
Start: 2016-01-11 — End: 2016-01-11
  Administered 2016-01-11: 80 mg via INTRAVENOUS

## 2016-01-11 MED ORDER — MORPHINE SULFATE (PF) 2 MG/ML IV SOLN
2.0000 mg | INTRAVENOUS | Status: DC | PRN
  Administered 2016-01-12 (×3): 2 mg via INTRAVENOUS
  Filled 2016-01-11 (×3): qty 1

## 2016-01-11 MED ORDER — ONDANSETRON HCL 4 MG/2ML IJ SOLN
INTRAMUSCULAR | Status: AC
  Administered 2016-01-11: 4 mg via INTRAVENOUS
  Filled 2016-01-11: qty 2

## 2016-01-11 MED ORDER — TRAMADOL HCL 50 MG PO TABS: 50.0000 mg | ORAL_TABLET | Freq: Four times a day (QID) | ORAL | 0 refills | Status: DC | PRN

## 2016-01-11 MED ORDER — ROCURONIUM BROMIDE 100 MG/10ML IV SOLN
INTRAVENOUS | Status: DC | PRN
  Administered 2016-01-11: 30 mg via INTRAVENOUS

## 2016-01-11 MED ORDER — DEXAMETHASONE SODIUM PHOSPHATE 10 MG/ML IJ SOLN
INTRAMUSCULAR | Status: DC | PRN
  Administered 2016-01-11: 5 mg via INTRAVENOUS

## 2016-01-11 MED ORDER — BUPIVACAINE-EPINEPHRINE (PF) 0.25% -1:200000 IJ SOLN
INTRAMUSCULAR | Status: DC | PRN
  Administered 2016-01-11: 30 mL

## 2016-01-11 MED ORDER — MORPHINE SULFATE (PF) 4 MG/ML IV SOLN
4.0000 mg | Freq: Once | INTRAVENOUS | Status: AC
Start: 2016-01-11 — End: 2016-01-11
  Administered 2016-01-11: 4 mg via INTRAVENOUS

## 2016-01-11 MED ORDER — MORPHINE SULFATE (PF) 4 MG/ML IV SOLN
INTRAVENOUS | Status: AC
  Administered 2016-01-11: 4 mg via INTRAVENOUS
  Filled 2016-01-11: qty 1

## 2016-01-11 MED ORDER — CIPROFLOXACIN IN D5W 400 MG/200ML IV SOLN
400.0000 mg | Freq: Two times a day (BID) | INTRAVENOUS | Status: DC
  Administered 2016-01-11: 400 mg via INTRAVENOUS
  Filled 2016-01-11 (×2): qty 200

## 2016-01-11 MED ORDER — IOPAMIDOL (ISOVUE-300) INJECTION 61%
100.0000 mL | Freq: Once | INTRAVENOUS | Status: AC | PRN
  Administered 2016-01-11: 100 mL via INTRAVENOUS
  Filled 2016-01-11: qty 100

## 2016-01-11 MED ORDER — LACTATED RINGERS IV SOLN
INTRAVENOUS | Status: DC
  Administered 2016-01-11 – 2016-01-12 (×3): via INTRAVENOUS

## 2016-01-11 MED ORDER — ONDANSETRON HCL 4 MG/2ML IJ SOLN
INTRAMUSCULAR | Status: DC | PRN
  Administered 2016-01-11: 4 mg via INTRAVENOUS

## 2016-01-11 MED ORDER — ONDANSETRON HCL 4 MG/2ML IJ SOLN
4.0000 mg | Freq: Once | INTRAMUSCULAR | Status: AC
  Administered 2016-01-11: 4 mg via INTRAVENOUS

## 2016-01-11 MED ORDER — ACETAMINOPHEN 10 MG/ML IV SOLN
INTRAVENOUS | Status: DC | PRN
  Administered 2016-01-11: 1000 mg via INTRAVENOUS

## 2016-01-11 MED ORDER — ONDANSETRON HCL 4 MG/2ML IJ SOLN: 4.0000 mg | Freq: Four times a day (QID) | INTRAMUSCULAR | Status: DC | PRN

## 2016-01-11 MED ORDER — IOPAMIDOL (ISOVUE-300) INJECTION 61%
30.0000 mL | Freq: Once | INTRAVENOUS | Status: AC | PRN
  Administered 2016-01-11: 30 mL via ORAL
  Filled 2016-01-11: qty 30

## 2016-01-11 MED ORDER — ONDANSETRON HCL 4 MG PO TABS
4.0000 mg | ORAL_TABLET | Freq: Four times a day (QID) | ORAL | Status: DC | PRN
  Filled 2016-01-11: qty 1

## 2016-01-11 MED ORDER — FENTANYL CITRATE (PF) 100 MCG/2ML IJ SOLN
25.0000 ug | INTRAMUSCULAR | Status: DC | PRN
Start: 2016-01-11 — End: 2016-01-12

## 2016-01-11 MED ORDER — PROPOFOL 10 MG/ML IV BOLUS
INTRAVENOUS | Status: DC | PRN
  Administered 2016-01-11: 150 mg via INTRAVENOUS

## 2016-01-11 MED ORDER — EPHEDRINE SULFATE 50 MG/ML IJ SOLN
INTRAMUSCULAR | Status: DC | PRN
Start: 2016-01-11 — End: 2016-01-11
  Administered 2016-01-11: 10 mg via INTRAVENOUS
  Administered 2016-01-11 (×2): 5 mg via INTRAVENOUS

## 2016-01-11 SURGICAL SUPPLY — 44 items
ADHESIVE MASTISOL STRL (MISCELLANEOUS) ×3 IMPLANT
APPLIER CLIP ROT 10 11.4 M/L (STAPLE) ×3
BLADE SURG SZ11 CARB STEEL (BLADE) ×3 IMPLANT
CANISTER SUCT 3000ML (MISCELLANEOUS) ×3 IMPLANT
CATH TRAY 16F METER LATEX (MISCELLANEOUS) ×3 IMPLANT
CHLORAPREP W/TINT 26ML (MISCELLANEOUS) ×3 IMPLANT
CLIP APPLIE ROT 10 11.4 M/L (STAPLE) ×1 IMPLANT
CLOSURE WOUND 1/2 X4 (GAUZE/BANDAGES/DRESSINGS) ×1
CUTTER FLEX LINEAR 45M (STAPLE) ×3 IMPLANT
DEVICE TROCAR PUNCTURE CLOSURE (ENDOMECHANICALS) ×3 IMPLANT
ELECT REM PT RETURN 9FT ADLT (ELECTROSURGICAL) ×3
ELECTRODE REM PT RTRN 9FT ADLT (ELECTROSURGICAL) ×1 IMPLANT
ENDOPOUCH RETRIEVER 10 (MISCELLANEOUS) ×3 IMPLANT
GAUZE SPONGE 4X4 12PLY STRL (GAUZE/BANDAGES/DRESSINGS) ×3 IMPLANT
GAUZE SPONGE NON-WVN 2X2 STRL (MISCELLANEOUS) ×3 IMPLANT
GLOVE BIO SURGEON STRL SZ8 (GLOVE) ×3 IMPLANT
GOWN STRL REUS W/ TWL LRG LVL3 (GOWN DISPOSABLE) ×2 IMPLANT
GOWN STRL REUS W/TWL LRG LVL3 (GOWN DISPOSABLE) ×4
HOLDER FOLEY CATH W/STRAP (MISCELLANEOUS) ×3 IMPLANT
IRRIGATION STRYKERFLOW (MISCELLANEOUS) ×1 IMPLANT
IRRIGATOR STRYKERFLOW (MISCELLANEOUS) ×3
KIT RM TURNOVER STRD PROC AR (KITS) ×3 IMPLANT
LABEL OR SOLS (LABEL) ×3 IMPLANT
LOOP SUT CHROMIC 0 SGL3 (SUTURE) ×3 IMPLANT
NDL SAFETY 22GX1.5 (NEEDLE) ×3 IMPLANT
NEEDLE VERESS 14GA 120MM (NEEDLE) ×3 IMPLANT
NS IRRIG 500ML POUR BTL (IV SOLUTION) ×3 IMPLANT
PACK LAP CHOLECYSTECTOMY (MISCELLANEOUS) ×3 IMPLANT
RELOAD 45 VASCULAR/THIN (ENDOMECHANICALS) ×3 IMPLANT
RELOAD STAPLE TA45 3.5 REG BLU (ENDOMECHANICALS) ×3 IMPLANT
SCISSORS METZENBAUM CVD 33 (INSTRUMENTS) ×3 IMPLANT
SLEEVE ENDOPATH XCEL 5M (ENDOMECHANICALS) ×3 IMPLANT
SOL .9 NS 3000ML IRR  AL (IV SOLUTION) ×2
SOL .9 NS 3000ML IRR UROMATIC (IV SOLUTION) ×1 IMPLANT
SPONGE LAP 18X18 5 PK (GAUZE/BANDAGES/DRESSINGS) ×3 IMPLANT
SPONGE VERSALON 2X2 STRL (MISCELLANEOUS) ×6
STRIP CLOSURE SKIN 1/2X4 (GAUZE/BANDAGES/DRESSINGS) ×2 IMPLANT
SUT MNCRL 4-0 (SUTURE) ×2
SUT MNCRL 4-0 27XMFL (SUTURE) ×1
SUT VICRYL 0 TIES 12 18 (SUTURE) ×3 IMPLANT
SUTURE MNCRL 4-0 27XMF (SUTURE) ×1 IMPLANT
TROCAR XCEL 12X100 BLDLESS (ENDOMECHANICALS) ×3 IMPLANT
TROCAR XCEL NON-BLD 5MMX100MML (ENDOMECHANICALS) ×3 IMPLANT
TUBING INSUFFLATOR HI FLOW (MISCELLANEOUS) ×3 IMPLANT

## 2016-01-11 NOTE — Anesthesia Procedure Notes (Signed)
Procedure Name: Intubation Date/Time: 01/11/2016 10:42 PM Performed by: Aline Brochure Pre-anesthesia Checklist: Patient identified, Emergency Drugs available, Suction available and Patient being monitored Patient Re-evaluated:Patient Re-evaluated prior to inductionOxygen Delivery Method: Circle system utilized Preoxygenation: Pre-oxygenation with 100% oxygen Intubation Type: IV induction Ventilation: Mask ventilation without difficulty Laryngoscope Size: Mac and 3 Grade View: Grade III Tube type: Oral Tube size: 7.0 mm Airway Equipment and Method: Bougie stylet Placement Confirmation: ETT inserted through vocal cords under direct vision,  positive ETCO2 and breath sounds checked- equal and bilateral Secured at: 19 cm Tube secured with: Tape Dental Injury: Teeth and Oropharynx as per pre-operative assessment  Difficulty Due To: Difficult Airway- due to anterior larynx

## 2016-01-11 NOTE — ED Provider Notes (Signed)
[facility] Emergency Department Provider Note   ____________________________________________    I have reviewed the triage vital signs and the nursing notes.   HISTORY  Chief Complaint Abdominal Pain     HPI Ellen Hamilton is a 65 y.o. female who presents with abdominal pain. Patient reports the pain started yesterday at 6 PM. She reports it has gotten worse and is now severe pain in the right lower quadrant. She is never had this before. No history of abdominal surgeries. No nausea or vomiting. Normal appetite. Mild constipation. No fevers or chills. She reports the pain is worse with any movement   Past Medical History:  Diagnosis Date  . Compression fracture of L5 lumbar vertebra ([place]) 01/2014  . Fibromyalgia   . GERD (gastroesophageal reflux disease)   . History of transient ischemic attack (TIA) [id number]  . Hyperlipidemia   . Hypertension   . Osteoporosis   . Sleep apnea 2009   sleep study done in [employer]  . Stroke [facility]) [phone]   pt has had two    Patient Active Problem List   Diagnosis Date Noted  . Candidiasis of breast 04/28/2015  . Long-term use of high-risk medication 04/13/2014  . Lumbar compression fracture ([place]) 02/13/2014  . Compression fracture of L5 lumbar vertebra ([place]) 01/25/2014  . Diaphoresis 01/05/2014  . Medicare annual wellness visit, subsequent 09/28/2012  . History of thyroid nodule 06/13/2012  . Benign colonic polyp 06/13/2012  . OSA on CPAP 06/06/2012  . Cerebrovascular disease 06/04/2012  . Hypertension 06/04/2012  . Hyperlipidemia 06/04/2012  . Osteoporosis 06/04/2012    Past Surgical History:  Procedure Laterality Date  . BACK SURGERY    . BUNIONECTOMY Bilateral [id number]  . CATARACT EXTRACTION W/ INTRAOCULAR LENS  IMPLANT, BILATERAL  2010  . EYE SURGERY    . kyphoplaasty  2010  . KYPHOPLASTY N/A 02/13/2014   Procedure: KYPHOPLASTY LUMBAR FIVE;  Surgeon: Charlie Pitter, MD;  Location: [place]  NEURO ORS;  Service: Neurosurgery;  Laterality: N/A;    Prior to Admission medications   Medication Sig Start Date End Date Taking? Authorizing Provider  amLODipine (NORVASC) 5 MG tablet TAKE 1 TABLET DAILY 09/24/15   Crecencio Mc, MD  aspirin 81 MG tablet Take 81 mg by mouth 2 (two) times daily.     Historical Provider, MD  calcitonin, salmon, (MIACALCIN/FORTICAL) 200 UNIT/ACT nasal spray Reported on 08/14/2015 05/17/15   Historical Provider, MD  CALCIUM CITRATE PO Take 500 mg by mouth 3 (three) times daily.    Historical Provider, MD  cholecalciferol (VITAMIN D) 1000 UNITS tablet Take 1,000 Units by mouth daily.    Historical Provider, MD  dipyridamole (PERSANTINE) 50 MG tablet TAKE 4 TABLETS (200 MG) TWICE A DAY 12/26/15   Crecencio Mc, MD  fish oil-omega-3 fatty acids 1000 MG capsule Take 1,000 mg by mouth daily.    Historical Provider, MD  metoprolol succinate (TOPROL-XL) 100 MG 24 hr tablet TAKE 1 TABLET DAILY 06/26/15   Crecencio Mc, MD  Multiple Vitamin (MULTIVITAMIN) tablet Take 1 tablet by mouth daily.    Historical Provider, MD  ranitidine (ZANTAC) 150 MG tablet TAKE ONE-HALF (1/2) TABLET (75 MG TOTAL) TWICE A DAY 11/20/15   Crecencio Mc, MD  rosuvastatin (CRESTOR) 40 MG tablet Take 1 tablet (40 mg total) by mouth daily. 12/24/15   Crecencio Mc, MD  vitamin B-12 (CYANOCOBALAMIN) 1000 MCG tablet Take 1,000 mcg by mouth every other day.    Historical  Provider, MD     Allergies Oxycodone; Septra [sulfamethoxazole-trimethoprim]; and Penicillins  Family History  Problem Relation Age of Onset  . Mental illness Mother     alzheimers  . Heart disease Father   . COPD Father   . Cancer Father     possible colon CA  . Mental illness Maternal Aunt     alzheimers  . Multiple sclerosis Cousin   . Colon cancer Neg Hx     Social History Social History  Substance Use Topics  . Smoking status: Never Smoker  . Smokeless tobacco: Never Used  . Alcohol use 4.2 oz/week    7 Glasses of  wine per week    Review of Systems  Constitutional: No fever/chills Eyes: No visual changes.  ENT: No sore throat. Cardiovascular: Denies chest pain. Respiratory: Denies shortness of breath. Gastrointestinal: As above  No nausea, no vomiting.  Decreased appetite Genitourinary: Negative for dysuria. Musculoskeletal: Negative for back pain. Skin: Negative for rash. Neurological: Negative for headaches  10-point ROS otherwise negative.  ____________________________________________   PHYSICAL EXAM:  VITAL SIGNS: ED Triage Vitals  Enc Vitals Group     BP 01/11/16 1529 125/73     Pulse Rate 01/11/16 1529 66     Resp 01/11/16 1529 18     Temp 01/11/16 1529 98.3 F (36.8 C)     Temp Source 01/11/16 1529 Oral     SpO2 01/11/16 1529 96 %     Weight 01/11/16 1530 170 lb (77.1 kg)     Height 01/11/16 1530 5\' 4"  (1.626 m)     Head Circumference --      Peak Flow --      Pain Score 01/11/16 1530 4     Pain Loc --      Pain Edu? --      Excl. in [place]? --     Constitutional: Alert and oriented. No acute distress. Pleasant and interactive Eyes: Conjunctivae are normal.  Head: Atraumatic. Nose: No congestion/rhinnorhea. Mouth/Throat: Mucous membranes are moist.   Neck:  Painless ROM Cardiovascular: Normal rate, regular rhythm. Grossly normal heart sounds.  Good peripheral circulation. Respiratory: Normal respiratory effort.  No retractions. Lungs CTAB. Gastrointestinal: Significant tenderness in the mid right abdomen, with positive rebound. No distention.  No CVA tenderness. Genitourinary: deferred Musculoskeletal: No lower extremity tenderness nor edema.  Warm and well perfused Neurologic:  Normal speech and language. No gross focal neurologic deficits are appreciated.  Skin:  Skin is warm, dry and intact. No rash noted. Psychiatric: Mood and affect are normal. Speech and behavior are normal.  ____________________________________________   LABS (all labs ordered are listed,  but only abnormal results are displayed)  Labs Reviewed - No data to display ____________________________________________  EKG  None ____________________________________________  RADIOLOGY  CT abdomen pelvis consistent with acute appendicitis ____________________________________________   PROCEDURES  Procedure(s) performed: No    Critical Care performed: No ____________________________________________   INITIAL IMPRESSION / ASSESSMENT AND PLAN / ED COURSE  Pertinent labs & imaging results that were available during my care of the patient were reviewed by me and considered in my medical decision making (see chart for details).  Patient presents with right lower quadrant tenderness to palpation which is significant. Differential includes appendicitis versus diverticulitis. We will obtain CT abdomen and pelvis and give IV morphine and IV Zofran and reevaluate the  Clinical Course  CT scan consistent with acute appendicitis, surgery consulted ____________________________________________   FINAL CLINICAL IMPRESSION(S) / ED DIAGNOSES  Final diagnoses:  Acute appendicitis with generalized peritonitis      NEW MEDICATIONS STARTED DURING THIS VISIT:  New Prescriptions   No medications on file     Note:  This document was prepared using Dragon voice recognition software and may include unintentional dictation errors.    Lavonia Drafts, MD 01/11/16 [phone]

## 2016-01-11 NOTE — Telephone Encounter (Signed)
I do not have any appointments available today.  I can work her in on Monday,  But if she needs to be seen today it will have to be by one of the other providers

## 2016-01-11 NOTE — ED Notes (Addendum)
Called 2C to inquire about bed ready delay for pt being admitted; explained that bed was assigned an hour ago. Naylee (RN? that took call) stated that bed had not be approved yet (?) and that both the assigned RN taking the pt and the Charge RN were tied up in other pt's rooms at this time. Was told to notify the ED Charge RN for follow-up regarding pt going to surgery before being sent to the Med-Surg floor.

## 2016-01-11 NOTE — ED Notes (Signed)
MD at bedside. 

## 2016-01-11 NOTE — Discharge Instructions (Signed)
Remove dressing in 24 hours. °May shower in 24 hours. °Leave paper strips in place. °Resume all home medications. °Follow-up with Dr. Tenea Sens in 10 days. °

## 2016-01-11 NOTE — Transfer of Care (Signed)
Immediate Anesthesia Transfer of Care Note  Patient: Ellen Hamilton  Procedure(s) Performed: Procedure(s): APPENDECTOMY LAPAROSCOPIC (N/A)  Patient Location: PACU  Anesthesia Type:General  Level of Consciousness: sedated  Airway & Oxygen Therapy: Patient connected to face mask oxygen  Post-op Assessment: Post -op Vital signs reviewed and stable  Post vital signs: stable  Last Vitals:  Vitals:   01/11/16 2131 01/11/16 2331  BP: 102/63 (!) 156/88  Pulse: (!) 57 83  Resp: 16 19  Temp:      Last Pain:  Vitals:   01/11/16 2131  TempSrc:   PainSc: 2          Complications: No apparent anesthesia complications

## 2016-01-11 NOTE — Telephone Encounter (Signed)
Please all patient and get more details we have no appointments. Call cell.

## 2016-01-11 NOTE — Anesthesia Preprocedure Evaluation (Signed)
Anesthesia Evaluation  Patient identified by MRN, date of birth, ID band Patient awake    Reviewed: Allergy & Precautions, H&P , NPO status , Patient's Chart, lab work & pertinent test results  History of Anesthesia Complications Negative for: history of anesthetic complications  Airway Mallampati: III  TM Distance: >3 FB Neck ROM: limited    Dental no notable dental hx. (+) Poor Dentition, Chipped   Pulmonary neg shortness of breath, sleep apnea and Continuous Positive Airway Pressure Ventilation ,    Pulmonary exam normal breath sounds clear to auscultation       Cardiovascular Exercise Tolerance: Good hypertension, (-) angina(-) Past MI and (-) DOE Normal cardiovascular exam Rhythm:regular Rate:Normal     Neuro/Psych  Neuromuscular disease CVA, Residual Symptoms negative psych ROS   GI/Hepatic Neg liver ROS, GERD  Controlled,  Endo/Other  negative endocrine ROS  Renal/GU      Musculoskeletal  (+) Fibromyalgia -  Abdominal   Peds  Hematology negative hematology ROS (+)   Anesthesia Other Findings Past Medical History: 01/2014: Compression fracture of L5 lumbar vertebra ([facility]* No date: Fibromyalgia No date: GERD (gastroesophageal reflux disease) [id number]: History of transient ischemic attack (TIA) No date: Hyperlipidemia No date: Hypertension No date: Osteoporosis 2009: Sleep apnea     Comment: sleep study done in [place]: Stroke [facility])     Comment: pt has had two  Past Surgical History: No date: BACK SURGERY 2000,2001: BUNIONECTOMY Bilateral 2010: CATARACT EXTRACTION W/ INTRAOCULAR LENS  IMPLA* No date: EYE SURGERY 2010: kyphoplaasty 02/13/2014: KYPHOPLASTY N/A     Comment: Procedure: KYPHOPLASTY LUMBAR FIVE;  Surgeon:               Charlie Pitter, MD;  Location: [place] NEURO ORS;                Service: Neurosurgery;  Laterality: N/A;  BMI    Body Mass Index:  29.18 kg/m      Reproductive/Obstetrics negative OB ROS                             Anesthesia Physical Anesthesia Plan  ASA: III  Anesthesia Plan: General ETT and Rapid Sequence   Post-op Pain Management:    Induction:   Airway Management Planned:   Additional Equipment:   Intra-op Plan:   Post-operative Plan:   Informed Consent: I have reviewed the patients History and Physical, chart, labs and discussed the procedure including the risks, benefits and alternatives for the proposed anesthesia with the patient or authorized representative who has indicated his/her understanding and acceptance.     Plan Discussed with: Anesthesiologist, CRNA and Surgeon  Anesthesia Plan Comments:         Anesthesia Quick Evaluation

## 2016-01-11 NOTE — H&P (Signed)
Ellen Hamilton is an 65 y.o. female.    Chief Complaint: Right lower quadrant pain  HPI: This a patient with acute onset of right lower quadrant pain started yesterday at approximately 1800 hrs. it is been gradually worsening being she's had no nausea vomiting no fevers or chills and had a normal bowel movement today she's never had an episode like this before. She points to the right lower quadrant as source of her pain. She denies any abdominal surgery in the past she's had a stroke but is not anticoagulated and only has some gait problems following her stroke.  Past Medical History:  Diagnosis Date  . Compression fracture of L5 lumbar vertebra ([place]) 01/2014  . Fibromyalgia   . GERD (gastroesophageal reflux disease)   . History of transient ischemic attack (TIA) 7902,[id number]  . Hyperlipidemia   . Hypertension   . Osteoporosis   . Sleep apnea 2009   sleep study done in [place]  . Stroke [facility]) [phone]   pt has had two    Past Surgical History:  Procedure Laterality Date  . BACK SURGERY    . BUNIONECTOMY Bilateral [id number]  . CATARACT EXTRACTION W/ INTRAOCULAR LENS  IMPLANT, BILATERAL  2010  . EYE SURGERY    . kyphoplaasty  2010  . KYPHOPLASTY N/A 02/13/2014   Procedure: KYPHOPLASTY LUMBAR FIVE;  Surgeon: Charlie Pitter, MD;  Location: [place] NEURO ORS;  Service: Neurosurgery;  Laterality: N/A;    Family History  Problem Relation Age of Onset  . Mental illness Mother     alzheimers  . Heart disease Father   . COPD Father   . Cancer Father     possible colon CA  . Mental illness Maternal Aunt     alzheimers  . Multiple sclerosis Cousin   . Colon cancer Neg Hx    Social History:  reports that she has never smoked. She has never used smokeless tobacco. She reports that she drinks about 4.2 oz of alcohol per week . She reports that she does not use drugs.  Allergies:  Allergies  Allergen Reactions  . Oxycodone Other (See Comments)    hallucinations  . Septra  [Sulfamethoxazole-Trimethoprim]     Caused acute renal failure  . Penicillins Rash    Has patient had a PCN reaction causing immediate rash, facial/tongue/throat swelling, SOB or lightheadedness with hypotension: no Has patient had a PCN reaction causing severe rash involving mucus membranes or skin necrosis: no Has patient had a PCN reaction that required hospitalization. no Has patient had a PCN reaction occurring within the last 10 years: no If all of the above answers are "NO", then may proceed with Cephalosporin use.      (Not in a hospital admission)   Review of Systems  Constitutional: Negative for chills and fever.  HENT: Negative.   Eyes: Negative.   Respiratory: Negative.   Cardiovascular: Negative.   Gastrointestinal: Positive for abdominal pain. Negative for blood in stool, constipation, diarrhea, heartburn, melena, nausea and vomiting.  Genitourinary: Negative.   Musculoskeletal: Negative.   Skin: Negative.   Neurological: Negative.   Endo/Heme/Allergies: Negative.   Psychiatric/Behavioral: Negative.      Physical Exam:  BP 120/66 (BP Location: Left Arm)   Pulse (!) 54   Temp 98.3 F (36.8 C) (Oral)   Resp 16   Ht 5' 4"  (1.626 m)   Wt 170 lb (77.1 kg)   SpO2 98%   BMI 29.18 kg/m   Physical Exam  Constitutional: She is  oriented to person, place, and time and well-developed, well-nourished, and in no distress. No distress.  HENT:  Head: Normocephalic and atraumatic.  Eyes: Pupils are equal, round, and reactive to light. Right eye exhibits no discharge. Left eye exhibits no discharge. No scleral icterus.  Neck: Normal range of motion.  Cardiovascular: Normal rate, regular rhythm and normal heart sounds.   Pulmonary/Chest: Effort normal and breath sounds normal. No respiratory distress. She has no wheezes. She has no rales.  Abdominal: Soft. She exhibits no distension. There is tenderness. There is rebound and guarding.  Maximal tenderness at McBurney's  point with positive Rovsing sign  Musculoskeletal: Normal range of motion. She exhibits no edema or tenderness.  Lymphadenopathy:    She has no cervical adenopathy.  Neurological: She is alert and oriented to person, place, and time.  Skin: Skin is warm and dry. She is not diaphoretic.  Psychiatric: Mood and affect normal.  Vitals reviewed.       Results for orders placed or performed during the hospital encounter of 01/11/16 (from the past 48 hour(s))  Lipase, blood     Status: None   Collection Time: 01/10/16 11:09 PM  Result Value Ref Range   Lipase 27 11 - 51 U/L  Comprehensive metabolic panel     Status: Abnormal   Collection Time: 01/10/16 11:09 PM  Result Value Ref Range   Sodium 137 135 - 145 mmol/L   Potassium 3.9 3.5 - 5.1 mmol/L   Chloride 103 101 - 111 mmol/L   CO2 25 22 - 32 mmol/L   Glucose, Bld 108 (H) 65 - 99 mg/dL   BUN 13 6 - 20 mg/dL   Creatinine, Ser 0.66 0.44 - 1.00 mg/dL   Calcium 9.7 8.9 - 10.3 mg/dL   Total Protein 8.3 (H) 6.5 - 8.1 g/dL   Albumin 4.9 3.5 - 5.0 g/dL   AST 28 15 - 41 U/L   ALT 15 14 - 54 U/L   Alkaline Phosphatase 39 38 - 126 U/L   Total Bilirubin 0.4 0.3 - 1.2 mg/dL   GFR calc non Af Amer >60 >60 mL/min   GFR calc Af Amer >60 >60 mL/min    Comment: (NOTE) The eGFR has been calculated using the CKD EPI equation. This calculation has not been validated in all clinical situations. eGFR's persistently <60 mL/min signify possible Chronic Kidney Disease.    Anion gap 9 5 - 15  CBC     Status: None   Collection Time: 01/10/16 11:09 PM  Result Value Ref Range   WBC 9.5 3.6 - 11.0 K/uL   RBC 4.46 3.80 - 5.20 MIL/uL   Hemoglobin 14.7 12.0 - 16.0 g/dL   HCT 41.4 35.0 - 47.0 %   MCV 92.9 80.0 - 100.0 fL   MCH 32.9 26.0 - 34.0 pg   MCHC 35.4 32.0 - 36.0 g/dL   RDW 13.1 11.5 - 14.5 %   Platelets 169 150 - 440 K/uL   Ct Abdomen Pelvis W Contrast  Result Date: 01/11/2016 CLINICAL DATA:  64 year old female with acute right lower  abdominal and pelvic pain today. EXAM: CT ABDOMEN AND PELVIS WITH CONTRAST TECHNIQUE: Multidetector CT imaging of the abdomen and pelvis was performed using the standard protocol following bolus administration of intravenous contrast. CONTRAST:  135m ISOVUE-300 IOPAMIDOL (ISOVUE-300) INJECTION 61% COMPARISON:  None. FINDINGS: Lower chest:  Cardiomegaly and coronary artery calcifications noted. Hepatobiliary: The liver and gallbladder are unremarkable except for multiple hepatic cysts. There is no evidence of  biliary dilatation. Pancreas: Unremarkable Spleen: Unremarkable Adrenals/Urinary Tract: Mild bilateral renal cortical thinning and small bilateral renal cysts are noted. A 2.2 x 2.7 cm left adrenal mass has a relative washout of 40%, consistent with an adenoma. The right adrenal gland and bladder are unremarkable. Stomach/Bowel: An enlarged appendix with adjacent inflammation is compatible with appendicitis. No free fluid, abscess or bowel obstruction. Vascular/Lymphatic: Abdominal aortic atherosclerotic calcifications noted without aneurysm. No enlarged lymph nodes identified. Reproductive: Unremarkable Other: No free fluid or pneumoperitoneum. Musculoskeletal: Compression fractures and vertebral augmentation changes at T12 and L5 noted. No acute abnormalities identified. IMPRESSION: Appendicitis without complicating features. Abdominal aortic atherosclerosis. Cardiomegaly and coronary artery disease. Electronically Signed   By: Margarette Canada M.D.   On: 01/11/2016 17:36     Assessment/Plan  White blood cell count was normal but her CT scan suggests acute appendicitis. I discussed the rationale for offering L laparotomy or laparoscopy and the options of observation we also discussed risks of bleeding infection negative laparoscopy conversion to an open procedure and recurrence of symptoms questions were answered for her she understood and agreed to proceed she is not currently anticoagulated  Florene Glen, MD, FACS

## 2016-01-11 NOTE — ED Triage Notes (Signed)
Reports RLQ pain since last pm, seen here and had protocols but states they waited for a long time then left.  Today still having the same pain.  Denies n/v/d.  States she does not want new blood work done at this time.

## 2016-01-11 NOTE — Op Note (Signed)
laparascopic appendectomy   Ellen Hamilton Date of operation:  01/11/2016  Indications: The patient presented with a history of  abdominal pain. Workup has revealed findings consistent with acute appendicitis.  Pre-operative Diagnosis: acute appendicitis  Post-operative Diagnosis: acute appendicitis, suppurative, nonruptured  Surgeon: Jerrol Banana. Burt Knack, MD, FACS  Anesthesia: General with endotracheal tube  Procedure Details  The patient was seen again in the preop area. The options of surgery versus observation were reviewed with the patient and/or family. The risks of bleeding, infection, recurrence of symptoms, negative laparoscopy, potential for an open procedure, bowel injury, abscess or infection, were all reviewed as well. The patient was taken to Operating Room, identified as Ellen Hamilton and the procedure verified as laparoscopic appendectomy. A Time Out was held and the above information confirmed.  The patient was placed in the supine position and general anesthesia was induced.  Antibiotic prophylaxis was administered and VT E prophylaxis was in place. A Foley catheter was placed by the nursing staff.   The abdomen was prepped and draped in a sterile fashion. An infraumbilical incision was made. A Veress needle was placed and pneumoperitoneum was obtained. A 5 mm trocar port was placed without difficulty and the abdominal cavity was explored.  Under direct vision a 5 mm suprapubic port was placed and a 13 mm left lateral port was placed all under direct vision.  The appendix was identified and found to be acutely inflamed, suppurative but not ruptured. The appendix was carefully dissected. The base of the appendix was dissected out and divided with a standard load Endo GIA. The mesoappendix was divided with a vascular load Endo GIA.  The appendix was passed out through the left lateral port site with the aid of an Endo Catch bag. The right lower quadrant and pelvis was  then irrigated with copious amounts of normal saline which was aspirated. Multiple arterial bleeders were present on the staple line of the mesoappendix. These were controlled with multiple endoclips. A single ) chromic endoloop was placed over an area of persistant bleeding at the end of the staple line.  Further inspection  failed to identify any additional bleeding and there were no signs of bowel injury. Repeated irrigation, aspiration and inspection was performed to insure no further bleeding. The left lateral port site was closed under direct vision utilizing an Endo Close technique with 0 Vicryl interrupted sutures, all under direct vision.   Again the right lower quadrant was inspected there was no sign of bleeding or bowel injury therefore pneumoperitoneum was released, all ports were removed and the skin incisions were approximated with subcuticular 4-0 Monocryl. Steri-Strips and Mastisol and sterile dressings were placed.  The patient tolerated the procedure well, there were no complications. The sponge lap and needle count were correct at the end of the procedure.  The patient was taken to the recovery room in stable condition to be admitted for continued care.  Findings: acute appendicitis, nonruptured  Estimated Blood Loss: 25cc                  Specimens: appendix         Complications:  none                  Ellen Hamilton E. Burt Knack MD, FACS

## 2016-01-11 NOTE — Telephone Encounter (Signed)
Please advise 

## 2016-01-11 NOTE — Progress Notes (Signed)
Preoperative Review   Patient is met in the preoperative holding area. The history is reviewed in the chart and with the patient. I personally reviewed the options and rationale as well as the risks of this procedure that have been previously discussed with the patient. All questions asked by the patient and/or family were answered to their satisfaction.  Patient agrees to proceed with this procedure at this time.  Laren Whaling E Destyni Hoppel M.D. FACS  

## 2016-01-11 NOTE — ED Notes (Signed)
Ann Therapist, occupational) informed of bed ready concerns. She stated that [facility] had recently accepted as many as 6 admissions and were in the process of admitting pt's as fast as possible. Lelon Frohlich stated that she would look into the situation to keep this RN and pt/family posted on progress.

## 2016-01-11 NOTE — Telephone Encounter (Signed)
Patient husband requested to have pt worked in wit Dr. Derrel Nip  Pt currently has severe stomach pain was seen in the Emergency room and advise to follow up with her PCP  Pt contact  [phone]

## 2016-01-11 NOTE — Telephone Encounter (Signed)
Patient left ER last night without being seen.  Patients husband tried to call to see if patient could be seen today.  Patient was very disappointed that we had no room for this afternoon.  Patients husband stated he will be taking patient to urgent care or ED.

## 2016-01-12 DIAGNOSIS — K3580 Unspecified acute appendicitis: Secondary | ICD-10-CM | POA: Diagnosis not present

## 2016-01-12 MED ORDER — METOPROLOL SUCCINATE ER 100 MG PO TB24
100.0000 mg | ORAL_TABLET | Freq: Every day | ORAL | Status: DC
  Administered 2016-01-12: 100 mg via ORAL
  Filled 2016-01-12: qty 1

## 2016-01-12 MED ORDER — TRAMADOL HCL 50 MG PO TABS
50.0000 mg | ORAL_TABLET | Freq: Four times a day (QID) | ORAL | Status: DC | PRN
  Administered 2016-01-12: 50 mg via ORAL
  Filled 2016-01-12: qty 1

## 2016-01-12 MED ORDER — ASPIRIN EC 81 MG PO TBEC
81.0000 mg | DELAYED_RELEASE_TABLET | Freq: Two times a day (BID) | ORAL | Status: DC
  Administered 2016-01-12 (×2): 81 mg via ORAL
  Filled 2016-01-12 (×3): qty 1

## 2016-01-12 MED ORDER — ROSUVASTATIN CALCIUM 20 MG PO TABS
40.0000 mg | ORAL_TABLET | Freq: Every day | ORAL | Status: DC
  Filled 2016-01-12 (×2): qty 2

## 2016-01-12 MED ORDER — AMLODIPINE BESYLATE 5 MG PO TABS
5.0000 mg | ORAL_TABLET | Freq: Every day | ORAL | Status: DC
  Administered 2016-01-12: 5 mg via ORAL
  Filled 2016-01-12: qty 1

## 2016-01-12 MED ORDER — DIPYRIDAMOLE 75 MG PO TABS
150.0000 mg | ORAL_TABLET | Freq: Once | ORAL | Status: AC
  Administered 2016-01-12: 150 mg via ORAL
  Filled 2016-01-12: qty 2

## 2016-01-12 MED ORDER — DIPYRIDAMOLE 50 MG PO TABS
50.0000 mg | ORAL_TABLET | Freq: Three times a day (TID) | ORAL | Status: DC
  Filled 2016-01-12 (×2): qty 1

## 2016-01-12 MED ORDER — DIPYRIDAMOLE 75 MG PO TABS
200.0000 mg | ORAL_TABLET | Freq: Two times a day (BID) | ORAL | Status: DC
  Filled 2016-01-12: qty 1

## 2016-01-12 MED ORDER — TRAMADOL HCL 50 MG PO TABS: 50.0000 mg | ORAL_TABLET | Freq: Four times a day (QID) | ORAL | 0 refills | Status: DC | PRN

## 2016-01-12 NOTE — Care Management Obs Status (Signed)
[place] NOTIFICATION   Patient Details  Name: Dalexa Cloos MRN: [id number] Date of Birth: 26-Aug-1950   Medicare Observation Status Notification Given:  No (discharge order in less than 24 hours)    Ival Bible, RN 01/12/2016, 1:01 PM

## 2016-01-12 NOTE — Anesthesia Postprocedure Evaluation (Signed)
Anesthesia Post Note  Patient: Ellen Hamilton  Procedure(s) Performed: Procedure(s) (LRB): APPENDECTOMY LAPAROSCOPIC (N/A)  Patient location during evaluation: PACU Anesthesia Type: General Level of consciousness: awake and alert Pain management: pain level controlled Vital Signs Assessment: post-procedure vital signs reviewed and stable Respiratory status: spontaneous breathing, nonlabored ventilation, respiratory function stable and patient connected to nasal cannula oxygen Cardiovascular status: blood pressure returned to baseline and stable Postop Assessment: no signs of nausea or vomiting Anesthetic complications: no    Last Vitals:  Vitals:   01/11/16 2345 01/12/16 0002  BP: 108/66 111/61  Pulse: 68 (!) 50  Resp: 12 10  Temp:  36.1 C    Last Pain:  Vitals:   01/12/16 0002  TempSrc:   PainSc: 0-No pain                 Precious Haws Piscitello

## 2016-01-12 NOTE — Progress Notes (Signed)
Discharge instructions along with home medications and follow up gone over with patient and husband. Both verbalize that they understood instructions. One prescription given to patient. IV removed. Pt being discharged home on room air, no distress noted. Ammie Dalton, RN

## 2016-01-12 NOTE — Progress Notes (Signed)
Patient ID: Ellen Hamilton, female   DOB: Aug 20, 1950, 65 y.o.   MRN: [id number] Patient with the usual postoperative pain. No nausea or vomiting. Abdomen is soft dressings are dry bowel sounds are active. Lungs are clear AVSS Okay to discharge home today. Patient is aware of her discharge instructions.

## 2016-01-15 ENCOUNTER — Telehealth: Payer: Self-pay | Admitting: *Deleted

## 2016-01-15 ENCOUNTER — Encounter: Payer: Self-pay | Admitting: Internal Medicine

## 2016-01-15 ENCOUNTER — Encounter: Payer: Self-pay | Admitting: Surgery

## 2016-01-15 DIAGNOSIS — L719 Rosacea, unspecified: Secondary | ICD-10-CM | POA: Insufficient documentation

## 2016-01-15 DIAGNOSIS — K5730 Diverticulosis of large intestine without perforation or abscess without bleeding: Secondary | ICD-10-CM | POA: Insufficient documentation

## 2016-01-15 DIAGNOSIS — E669 Obesity, unspecified: Secondary | ICD-10-CM | POA: Insufficient documentation

## 2016-01-15 NOTE — Telephone Encounter (Signed)
[facility] has requested a prior authorization for rosuvastatin  Contact [phone]

## 2016-01-16 LAB — SURGICAL PATHOLOGY

## 2016-01-18 NOTE — Telephone Encounter (Signed)
Completed, approved from 12/19/15-01/17/17, sent mychart message back to patient. thanks

## 2016-01-21 ENCOUNTER — Ambulatory Visit (INDEPENDENT_AMBULATORY_CARE_PROVIDER_SITE_OTHER): Payer: Medicare Other | Admitting: Surgery

## 2016-01-21 ENCOUNTER — Encounter: Payer: Self-pay | Admitting: Surgery

## 2016-01-21 VITALS — BP 139/82 | HR 51 | Temp 97.9°F | Ht 62.5 in | Wt 180.0 lb

## 2016-01-21 DIAGNOSIS — Z09 Encounter for follow-up examination after completed treatment for conditions other than malignant neoplasm: Secondary | ICD-10-CM

## 2016-01-21 NOTE — Patient Instructions (Signed)
Please do not submerge in a pool or hot tub until all incisions are sealed completely. Please do not lift anything over 20 pounds until 6 weeks ( October 13) from your surgery. Please call our office if you have questions or concerns.

## 2016-01-21 NOTE — Progress Notes (Signed)
S/p lap appy by Dr. Burt Knack  9/1 Path c/w appendicitis Doing well Taking PO and having BM  PE NAD Abd: soft , periumbilical incision w some ecchymosis, no peritonitis, no signs of infection  A/P Doing well after lap appy Lifting restrictions d/w pt RTC prn

## 2016-03-14 DIAGNOSIS — Z23 Encounter for immunization: Secondary | ICD-10-CM | POA: Diagnosis not present

## 2016-04-06 ENCOUNTER — Encounter: Payer: Self-pay | Admitting: Internal Medicine

## 2016-04-06 DIAGNOSIS — M8000XS Age-related osteoporosis with current pathological fracture, unspecified site, sequela: Secondary | ICD-10-CM

## 2016-04-07 ENCOUNTER — Encounter: Payer: Self-pay | Admitting: Internal Medicine

## 2016-04-07 NOTE — Telephone Encounter (Signed)
DEXA ordered ,  FYI

## 2016-04-08 ENCOUNTER — Encounter: Payer: Self-pay | Admitting: Internal Medicine

## 2016-04-09 NOTE — Telephone Encounter (Signed)
Dr. Derrel Nip, I have already scheduled this pt for her bone density. She did not say that she wanted it in the [place] location (which is still [place]). I responded to her yesterday and gave her the number to reschedule this appointment for the [facility] location.

## 2016-04-22 ENCOUNTER — Ambulatory Visit
Admission: RE | Admit: 2016-04-22 | Discharge: 2016-04-22 | Disposition: A | Discharge status: Home / Self Care | Payer: Medicare Other | Source: Ambulatory Visit | Attending: Internal Medicine | Admitting: Internal Medicine

## 2016-04-22 DIAGNOSIS — M8000XS Age-related osteoporosis with current pathological fracture, unspecified site, sequela: Secondary | ICD-10-CM | POA: Diagnosis not present

## 2016-04-22 DIAGNOSIS — M85852 Other specified disorders of bone density and structure, left thigh: Secondary | ICD-10-CM | POA: Diagnosis not present

## 2016-04-23 DIAGNOSIS — G4733 Obstructive sleep apnea (adult) (pediatric): Secondary | ICD-10-CM | POA: Diagnosis not present

## 2016-04-23 DIAGNOSIS — I6930 Unspecified sequelae of cerebral infarction: Secondary | ICD-10-CM | POA: Diagnosis not present

## 2016-04-23 DIAGNOSIS — Z9989 Dependence on other enabling machines and devices: Secondary | ICD-10-CM | POA: Diagnosis not present

## 2016-04-23 DIAGNOSIS — E785 Hyperlipidemia, unspecified: Secondary | ICD-10-CM | POA: Diagnosis not present

## 2016-04-24 ENCOUNTER — Encounter: Payer: Self-pay | Admitting: Internal Medicine

## 2016-05-16 ENCOUNTER — Encounter: Payer: Self-pay | Admitting: Internal Medicine

## 2016-05-21 ENCOUNTER — Ambulatory Visit (INDEPENDENT_AMBULATORY_CARE_PROVIDER_SITE_OTHER): Payer: Medicare Other | Admitting: Family Medicine

## 2016-05-21 ENCOUNTER — Encounter: Payer: Self-pay | Admitting: Family Medicine

## 2016-05-21 VITALS — BP 128/70 | HR 64 | Temp 99.3°F | Wt 182.0 lb

## 2016-05-21 DIAGNOSIS — R059 Cough, unspecified: Secondary | ICD-10-CM

## 2016-05-21 DIAGNOSIS — J111 Influenza due to unidentified influenza virus with other respiratory manifestations: Secondary | ICD-10-CM

## 2016-05-21 DIAGNOSIS — R531 Weakness: Secondary | ICD-10-CM | POA: Diagnosis not present

## 2016-05-21 DIAGNOSIS — R05 Cough: Secondary | ICD-10-CM

## 2016-05-21 LAB — POCT INFLUENZA A: Rapid Influenza A Ag: POSITIVE

## 2016-05-21 MED ORDER — OSELTAMIVIR PHOSPHATE 75 MG PO CAPS: 75.0000 mg | ORAL_CAPSULE | Freq: Two times a day (BID) | ORAL | 0 refills | Status: DC

## 2016-05-21 NOTE — Patient Instructions (Signed)
For nasal congestion you can use saline nasal spray. For cough you can try Delsym. Drink enough fluids to make your urine light yellow. For fever/chill/muscle aches you can take over the counter acetaminophen or ibuprofen.  Please come back in if you are not better in 5-7 days or if you develop wheezing, shortness of breath or persistent vomiting.     Oseltamivir capsules What is this medicine? OSELTAMIVIR (os el TAM i vir) is an antiviral medicine. It is used to prevent and to treat some kinds of influenza or the flu. It will not work for colds or other viral infections. This medicine may be used for other purposes; ask your health care provider or pharmacist if you have questions. COMMON BRAND NAME(S): Tamiflu What should I tell my health care provider before I take this medicine? They need to know if you have any of the following conditions: -heart disease -immune system problems -kidney disease -liver disease -lung disease -an unusual or allergic reaction to oseltamivir, other medicines, foods, dyes, or preservatives -pregnant or trying to get pregnant -breast-feeding How should I use this medicine? Take this medicine by mouth with a glass of water. Follow the directions on the prescription label. Start this medicine at the first sign of flu symptoms. You can take it with or without food. If it upsets your stomach, take it with food. Take your medicine at regular intervals. Do not take your medicine more often than directed. Take all of your medicine as directed even if you think you are better. Do not skip doses or stop your medicine early. Talk to your pediatrician regarding the use of this medicine in children. While this drug may be prescribed for children as young as 14 days for selected conditions, precautions do apply. Overdosage: If you think you have taken too much of this medicine contact a poison control center or emergency room at once. NOTE: This medicine is only for you.  Do not share this medicine with others. What if I miss a dose? If you miss a dose, take it as soon as you remember. If it is almost time for your next dose (within 2 hours), take only that dose. Do not take double or extra doses. What may interact with this medicine? Interactions are not expected. This list may not describe all possible interactions. Give your health care provider a list of all the medicines, herbs, non-prescription drugs, or dietary supplements you use. Also tell them if you smoke, drink alcohol, or use illegal drugs. Some items may interact with your medicine. What should I watch for while using this medicine? Visit your doctor or health care professional for regular check ups. Tell your doctor if your symptoms do not start to get better or if they get worse. If you have the flu, you may be at an increased risk of developing seizures, confusion, or abnormal behavior. This occurs early in the illness, and more frequently in children and teens. These events are not common, but may result in accidental injury to the patient. Families and caregivers of patients should watch for signs of unusual behavior and contact a doctor or health care professional right away if the patient shows signs of unusual behavior. This medicine is not a substitute for the flu shot. Talk to your doctor each year about an annual flu shot. What side effects may I notice from receiving this medicine? Side effects that you should report to your doctor or health care professional as soon as possible: -allergic reactions  like skin rash, itching or hives, swelling of the face, lips, or tongue -anxiety, confusion, unusual behavior -breathing problems -hallucination, loss of contact with reality -redness, blistering, peeling or loosening of the skin, including inside the mouth -seizures Side effects that usually do not require medical attention (report to your doctor or health care professional if they continue or  are bothersome): -diarrhea -headache -nausea, vomiting -pain This list may not describe all possible side effects. Call your doctor for medical advice about side effects. You may report side effects to FDA at 1-800-FDA-1088. Where should I keep my medicine? Keep out of the reach of children. Store at room temperature between 15 and 30 degrees C (59 and 86 degrees F). Throw away any unused medicine after the expiration date. NOTE: This sheet is a summary. It may not cover all possible information. If you have questions about this medicine, talk to your doctor, pharmacist, or health care provider.  2017 Elsevier/Gold Standard (2014-11-01 10:50:39) Influenza, Adult Influenza, more commonly known as "the flu," is a viral infection that primarily affects the respiratory tract. The respiratory tract includes organs that help you breathe, such as the lungs, nose, and throat. The flu causes many common cold symptoms, as well as a high fever and body aches. The flu spreads easily from person to person (is contagious). Getting a flu shot (influenza vaccination) every year is the best way to prevent influenza. What are the causes? Influenza is caused by a virus. You can catch the virus by:  Breathing in droplets from an infected person's cough or sneeze.  Touching something that was recently contaminated with the virus and then touching your mouth, nose, or eyes. What increases the risk? The following factors may make you more likely to get the flu:  Not cleaning your hands frequently with soap and water or alcohol-based hand sanitizer.  Having close contact with many people during cold and flu season.  Touching your mouth, eyes, or nose without washing or sanitizing your hands first.  Not drinking enough fluids or not eating a healthy diet.  Not getting enough sleep or exercise.  Being under a high amount of stress.  Not getting a yearly (annual) flu shot. You may be at a higher risk of  complications from the flu, such as a severe lung infection (pneumonia), if you:  Are over the age of 58.  Are pregnant.  Have a weakened disease-fighting system (immune system). You may have a weakened immune system if you:  Have HIV or AIDS.  Are undergoing chemotherapy.  Aretaking medicines that reduce the activity of (suppress) the immune system.  Have a long-term (chronic) illness, such as heart disease, kidney disease, diabetes, or lung disease.  Have a liver disorder.  Are obese.  Have anemia. What are the signs or symptoms? Symptoms of this condition typically last 4-10 days and may include:  Fever.  Chills.  Headache, body aches, or muscle aches.  Sore throat.  Cough.  Runny or congested nose.  Chest discomfort and cough.  Poor appetite.  Weakness or tiredness (fatigue).  Dizziness.  Nausea or vomiting. How is this diagnosed? This condition may be diagnosed based on your medical history and a physical exam. Your health care provider may do a nose or throat swab test to confirm the diagnosis. How is this treated? If influenza is detected early, you can be treated with antiviral medicine that can reduce the length of your illness and the severity of your symptoms. This medicine may be given  by mouth (orally) or through an IV tube that is inserted in one of your veins. The goal of treatment is to relieve symptoms by taking care of yourself at home. This may include taking over-the-counter medicines, drinking plenty of fluids, and adding humidity to the air in your home. In some cases, influenza goes away on its own. Severe influenza or complications from influenza may be treated in a hospital. Follow these instructions at home:  Take over-the-counter and prescription medicines only as told by your health care provider.  Use a cool mist humidifier to add humidity to the air in your home. This can make breathing easier.  Rest as needed.  Drink enough  fluid to keep your urine clear or pale yellow.  Cover your mouth and nose when you cough or sneeze.  Wash your hands with soap and water often, especially after you cough or sneeze. If soap and water are not available, use hand sanitizer.  Stay home from work or school as told by your health care provider. Unless you are visiting your health care provider, try to avoid leaving home until your fever has been gone for 24 hours without the use of medicine.  Keep all follow-up visits as told by your health care provider. This is important. How is this prevented?  Getting an annual flu shot is the best way to avoid getting the flu. You may get the flu shot in late summer, fall, or winter. Ask your health care provider when you should get your flu shot.  Wash your hands often or use hand sanitizer often.  Avoid contact with people who are sick during cold and flu season.  Eat a healthy diet, drink plenty of fluids, get enough sleep, and exercise regularly. Contact a health care provider if:  You develop new symptoms.  You have:  Chest pain.  Diarrhea.  A fever.  Your cough gets worse.  You produce more mucus.  You feel nauseous or you vomit. Get help right away if:  You develop shortness of breath or difficulty breathing.  Your skin or nails turn a bluish color.  You have severe pain or stiffness in your neck.  You develop a sudden headache or sudden pain in your face or ear.  You cannot stop vomiting. This information is not intended to replace advice given to you by your health care provider. Make sure you discuss any questions you have with your health care provider. Document Released: 04/25/2000 Document Revised: 10/04/2015 Document Reviewed: 02/20/2015 Elsevier Interactive Patient Education  2017 [employer].

## 2016-05-21 NOTE — Progress Notes (Signed)
Subjective:    Patient ID: Ellen Hamilton, female    DOB: 03/07/1951, 66 y.o.   MRN: [id number]  HPI This is a 66 yo female, accompanied by her husband, who presents today with sudden onset of cough, muscle aches, fatigue two days ago. Dry, tight cough, some relief with Delsym. Headache relieved with ibuprofen 400 mg. Feels tired and weak. Husband has been recently diagnosed with pneumonia.   She has follow up scheduled with PCP in 5 days.    Past Medical History:  Diagnosis Date  . Compression fracture of L5 lumbar vertebra ([place]) 01/2014  . Fibromyalgia   . GERD (gastroesophageal reflux disease)   . History of transient ischemic attack (TIA) [id number]  . Hyperlipidemia   . Hypertension   . Osteoporosis   . Sleep apnea 2009   sleep study done in [place]  . Stroke [facility]) [phone]   pt has had two   Past Surgical History:  Procedure Laterality Date  . BACK SURGERY    . BUNIONECTOMY Bilateral [id number]  . CATARACT EXTRACTION W/ INTRAOCULAR LENS  IMPLANT, BILATERAL  2010  . EYE SURGERY    . kyphoplaasty  2010  . KYPHOPLASTY N/A 02/13/2014   Procedure: KYPHOPLASTY LUMBAR FIVE;  Surgeon: Charlie Pitter, MD;  Location: [facility];  Service: Neurosurgery;  Laterality: N/A;  . LAPAROSCOPIC APPENDECTOMY N/A 01/11/2016   Procedure: APPENDECTOMY LAPAROSCOPIC;  Surgeon: Florene Glen, MD;  Location: [facility] ORS;  Service: General;  Laterality: N/A;   Family History  Problem Relation Age of Onset  . Mental illness Mother     alzheimers  . Heart disease Father   . COPD Father   . Cancer Father     possible colon CA  . Mental illness Maternal Aunt     alzheimers  . Multiple sclerosis Cousin   . Colon cancer Neg Hx    Social History  Substance Use Topics  . Smoking status: Never Smoker  . Smokeless tobacco: Never Used  . Alcohol use 4.2 oz/week    7 Glasses of wine per week      Review of Systems Per HPI    Objective:   Physical Exam  Constitutional: She is oriented  to person, place, and time. She appears well-developed and well-nourished. She appears ill. No distress.  HENT:  Head: Normocephalic and atraumatic.  Right Ear: External ear normal.  Left Ear: External ear normal.  Nose: Nose normal.  Mouth/Throat: Oropharynx is clear and moist.  Eyes: Conjunctivae are normal.  Neck: Normal range of motion. Neck supple.  Cardiovascular: Normal rate, regular rhythm and normal heart sounds.   Pulmonary/Chest: Effort normal and breath sounds normal.  Occasional dry cough, no dyspnea, able to converse normally.   Lymphadenopathy:    She has no cervical adenopathy.  Neurological: She is alert and oriented to person, place, and time.  Skin: Skin is warm and dry. She is not diaphoretic (feels warm).  Psychiatric: She has a normal mood and affect. Her behavior is normal. Judgment and thought content normal.  Vitals reviewed.     BP 128/70   Pulse 64   Temp 99.3 F (37.4 C)   Wt 182 lb (82.6 kg)   SpO2 94%   BMI 32.76 kg/m  Wt Readings from Last 3 Encounters:  05/21/16 182 lb (82.6 kg)  01/21/16 180 lb (81.6 kg)  01/12/16 194 lb 14.4 oz (88.4 kg)   POCT flu- positive    Assessment & Plan:  1. Cough -  patient instructions: For nasal congestion you can use saline nasal spray. For cough you can try Delsym. Drink enough fluids to make your urine light yellow. For fever/chill/muscle aches you can take over the counter acetaminophen or ibuprofen.  Please come back in if you are not better in 5-7 days or if you develop wheezing, shortness of breath or persistent vomiting.  2. Weakness - rest, fluids  3. Influenza - Provided written and verbal information regarding diagnosis and treatment. - RTC precautions reviewed - oseltamivir (TAMIFLU) 75 MG capsule; Take 1 capsule (75 mg total) by mouth 2 (two) times daily.  Dispense: 10 capsule; Refill: 0   Clarene Reamer, FNP-BC  [facility] [place]  [facility]  05/21/2016 4:37 PM

## 2016-05-26 ENCOUNTER — Ambulatory Visit: Payer: Medicare Other

## 2016-05-26 ENCOUNTER — Ambulatory Visit (INDEPENDENT_AMBULATORY_CARE_PROVIDER_SITE_OTHER): Payer: Medicare Other | Admitting: Internal Medicine

## 2016-05-26 ENCOUNTER — Encounter: Payer: Self-pay | Admitting: Internal Medicine

## 2016-05-26 ENCOUNTER — Telehealth: Payer: Self-pay | Admitting: *Deleted

## 2016-05-26 VITALS — BP 126/62 | HR 77 | Temp 98.0°F | Resp 16 | Ht 63.0 in | Wt 180.0 lb

## 2016-05-26 DIAGNOSIS — R296 Repeated falls: Secondary | ICD-10-CM

## 2016-05-26 DIAGNOSIS — I679 Cerebrovascular disease, unspecified: Secondary | ICD-10-CM

## 2016-05-26 DIAGNOSIS — J101 Influenza due to other identified influenza virus with other respiratory manifestations: Secondary | ICD-10-CM

## 2016-05-26 DIAGNOSIS — M8000XS Age-related osteoporosis with current pathological fracture, unspecified site, sequela: Secondary | ICD-10-CM | POA: Diagnosis not present

## 2016-05-26 DIAGNOSIS — Z9049 Acquired absence of other specified parts of digestive tract: Secondary | ICD-10-CM | POA: Diagnosis not present

## 2016-05-26 DIAGNOSIS — Z23 Encounter for immunization: Secondary | ICD-10-CM

## 2016-05-26 DIAGNOSIS — I63312 Cerebral infarction due to thrombosis of left middle cerebral artery: Secondary | ICD-10-CM

## 2016-05-26 MED ORDER — RANITIDINE HCL 150 MG PO TABS: 150.0000 mg | ORAL_TABLET | Freq: Every day | ORAL | 3 refills | Status: AC

## 2016-05-26 MED ORDER — METOPROLOL SUCCINATE ER 100 MG PO TB24: 100.0000 mg | ORAL_TABLET | Freq: Every day | ORAL | 3 refills | Status: DC

## 2016-05-26 MED ORDER — ROSUVASTATIN CALCIUM 40 MG PO TABS: 40.0000 mg | ORAL_TABLET | Freq: Every day | ORAL | 3 refills | Status: DC

## 2016-05-26 MED ORDER — AMLODIPINE BESYLATE 5 MG PO TABS: 5.0000 mg | ORAL_TABLET | Freq: Every day | ORAL | 3 refills | Status: DC

## 2016-05-26 MED ORDER — DIPYRIDAMOLE 50 MG PO TABS: ORAL_TABLET | ORAL | 3 refills | Status: DC

## 2016-05-26 NOTE — Telephone Encounter (Signed)
Patient can vaccine after 06/02/16 Prolia is here.

## 2016-05-26 NOTE — Patient Instructions (Addendum)
Please reschedule your nurse visit for your Prolia injection when you have recovered from the flu (10-14 days)   PHYSICAL THERAPY HAS BEEN ORDERED  RETURN FOR FASTING LABS IN [place]

## 2016-05-26 NOTE — Telephone Encounter (Signed)
Pt  Will need a Prolia vaccine. Please give the appropriate time and date to schedule pt.  Pt contact [phone]

## 2016-05-26 NOTE — Telephone Encounter (Signed)
Scheduled

## 2016-05-26 NOTE — Telephone Encounter (Signed)
lvm to call office to schedule

## 2016-05-26 NOTE — Progress Notes (Signed)
Subjective:  Patient ID: Ellen Hamilton, female    DOB: 1951-03-15  Age: 66 y.o. MRN: [id number]  CC: The primary encounter diagnosis was Recurrent falls. Diagnoses of Need for prophylactic vaccination against Streptococcus pneumoniae (pneumococcus), Cerebrovascular disease, Cerebrovascular accident (CVA) due to thrombosis of left middle cerebral artery ([place]), S/P appendectomy, Influenza A, and Age-related osteoporosis with current pathological fracture, sequela were also pertinent to this visit.  HPI Cayton Messano presents for follow up on chronic conditions including history of CVA with residual right sided weakness, hypertension, osteoporosis wit prior vertebral fractures,  Now managed with Prolia, which is due today.  She was last seen in August. Had an appendectomy in September  And made an uneventful recovery  She has been recovering from influenza.  Positive rapid flu test on January 10, prescribed tamiflu . Symptoms improving.     Due for pneumovax #2 as well  Saw Neurology for follow up on CVA her antiplatelet therapy with  Dipyridamole has become  Cost prohibitive and she is wondering if she can resume using  Plavix .  Since her stroke occurred while taking Plavix,  Her neurologist is apprehensive about changing therapy without workup for Plavix failure.  She has note loss of  strength in her right arm and leg despite continued  exercised daily using Nustep,  Treadmill and swimming .  Feels like she is losing strength since the end of the [place] study; she knows that she  actually received the medication during the trial,  Which was stopped at end of November . The drug was pulled due to increased falls in the study subjects and no significant improvement in strength overall.   She has had No falls since the study ended, but during the studay she had a fall in her kitchen that occurred while preparing a meal, due to loss of balance during a sudden turn.  She has also had  several near misses , due to loss of balance with rising.   Discussed Cornell Barman at [facility] PT   Outpatient Medications Prior to Visit  Medication Sig Dispense Refill  . aspirin 81 MG tablet Take 81 mg by mouth 2 (two) times daily.     Marland Kitchen CALCIUM CITRATE PO Take 500 mg by mouth 3 (three) times daily.    . cholecalciferol (VITAMIN D) 1000 UNITS tablet Take 1,000 Units by mouth daily.    . fish oil-omega-3 fatty acids 1000 MG capsule Take 1,000 mg by mouth daily.    Marland Kitchen ibuprofen (ADVIL,MOTRIN) 200 MG tablet Take 400 mg by mouth 2 (two) times daily.    . Multiple Vitamin (MULTIVITAMIN) tablet Take 1 tablet by mouth daily.    Marland Kitchen oseltamivir (TAMIFLU) 75 MG capsule Take 1 capsule (75 mg total) by mouth 2 (two) times daily. 10 capsule 0  . traMADol (ULTRAM) 50 MG tablet Take 1 tablet (50 mg total) by mouth every 6 (six) hours as needed. 30 tablet 0  . traMADol (ULTRAM) 50 MG tablet Take 1 tablet (50 mg total) by mouth every 6 (six) hours as needed for moderate pain. 30 tablet 0  . vitamin B-12 (CYANOCOBALAMIN) 1000 MCG tablet Take 1,000 mcg by mouth every other day.    Marland Kitchen amLODipine (NORVASC) 5 MG tablet TAKE 1 TABLET DAILY 90 tablet 3  . dipyridamole (PERSANTINE) 50 MG tablet TAKE 4 TABLETS (200 MG) TWICE A DAY 720 tablet 1  . metoprolol succinate (TOPROL-XL) 100 MG 24 hr tablet TAKE 1 TABLET DAILY 90 tablet 3  .  ranitidine (ZANTAC) 150 MG tablet Take 150 mg by mouth daily.    . rosuvastatin (CRESTOR) 40 MG tablet Take 1 tablet (40 mg total) by mouth daily. 90 tablet 3   No facility-administered medications prior to visit.     Review of Systems;  Patient denies headache, fevers, malaise, unintentional weight loss, skin rash, eye pain, sinus congestion and sinus pain, sore throat, dysphagia,  hemoptysis , cough, dyspnea, wheezing, chest pain, palpitations, orthopnea, edema, abdominal pain, nausea, melena, diarrhea, constipation, flank pain, dysuria, hematuria, urinary  Frequency, nocturia, numbness,  tingling, seizures,  Focal weakness, Loss of consciousness,  Tremor, insomnia, depression, anxiety, and suicidal ideation.      Objective:  BP 126/62   Pulse 77   Temp 98 F (36.7 C) (Oral)   Resp 16   Ht 5\' 3"  (1.6 m)   Wt 180 lb (81.6 kg)   SpO2 97%   BMI 31.89 kg/m   BP Readings from Last 3 Encounters:  05/26/16 126/62  05/21/16 128/70  01/21/16 139/82    Wt Readings from Last 3 Encounters:  05/26/16 180 lb (81.6 kg)  05/21/16 182 lb (82.6 kg)  01/21/16 180 lb (81.6 kg)    General appearance: alert, cooperative and appears stated age Ears: normal TM's and external ear canals both ears Throat: lips, mucosa, and tongue normal; teeth and gums normal Neck: no adenopathy, no carotid bruit, supple, symmetrical, trachea midline and thyroid not enlarged, symmetric, no tenderness/mass/nodules Back: symmetric, no curvature. ROM normal. No CVA tenderness. Lungs: clear to auscultation bilaterally Heart: regular rate and rhythm, S1, S2 normal, no murmur, click, rub or gallop Abdomen: soft, non-tender; bowel sounds normal; no masses,  no organomegaly Pulses: 2+ and symmetric Skin: Skin color, texture, turgor normal. No rashes or lesions Lymph nodes: Cervical, supraclavicular, and axillary nodes normal.  No results found for: HGBA1C  Lab Results  Component Value Date   CREATININE 0.59 01/11/2016   CREATININE 0.66 01/10/2016   CREATININE 0.71 08/20/2015    Lab Results  Component Value Date   WBC 6.6 01/11/2016   HGB 13.6 01/11/2016   HCT 39.2 01/11/2016   PLT 167 01/11/2016   GLUCOSE 108 (H) 01/10/2016   CHOL 115 08/20/2015   TRIG 102.0 08/20/2015   HDL 43.60 08/20/2015   LDLCALC 51 08/20/2015   ALT 15 01/10/2016   AST 28 01/10/2016   NA 137 01/10/2016   K 3.9 01/10/2016   CL 103 01/10/2016   CREATININE 0.59 01/11/2016   BUN 13 01/10/2016   CO2 25 01/10/2016   TSH 4.26 10/01/2015    Dg Bone Density  Result Date: 04/22/2016 EXAM: DUAL X-RAY ABSORPTIOMETRY  (DXA) FOR BONE MINERAL DENSITY IMPRESSION: Dear Dr Deborra Medina, Your patient Keyly Dia completed a BMD test on 04/22/2016 using the [place] (analysis version: 14.10) manufactured by [employer]. The following summarizes the results of our evaluation. PATIENT BIOGRAPHICAL: Name: Allysen, Bail Patient ID: [id number] Birth Date: 12-19-1950 Height: 61.5 in. Gender: Female Exam Date: 04/22/2016 Weight: 181.2 lbs. Indications: Advanced Age, Caucasian, Family History of Fracture, Height Loss, History of Fracture (Adult), POSTmenopausal Fractures: FINGER, RIBS, Spine Treatments: 81 MG ASPIRIN, Calcium, multivitamin, prolia, Vitamin D ASSESSMENT: The BMD measured at Femur Neck Left is 0.815 g/cm2 with a T-score of -1.6. This patient is considered osteopenic according to [facility]) criteria.Patient is not a candidate for FRAX assessment due to prolia use. Site Region Measured Measured WHO Young Adult BMD Date  Age      Classification T-score DualFemur Neck Left 04/22/2016 66.5 Osteopenia -1.6 0.815 g/cm2 DualFemur Neck Left 04/26/2014 64.5 Osteopenia -1.8 0.781 g/cm2 AP Spine L1-L4 04/22/2016 66.5 Normal -0.2 1.160 g/cm2 AP Spine L1-L4 04/26/2014 64.5 Normal -0.7 1.109 g/cm2 World Health Organization [facility]) criteria for post-menopausal, Caucasian Women: Normal:       T-score at or above -1 SD Osteopenia:   T-score between -1 and -2.5 SD Osteoporosis: T-score at or below -2.5 SD RECOMMENDATIONS: [place] recommends that FDA-approved medical therapies be considered in postemenopausal women and men age 42 or older with a: 1. Hip or vertebral (clinical or morphometric) fracture. 2. T-score of < -2.5at the spine or hip. 3. Ten-year fracture probability by FRAX of 3% or greater for hip fracture or 20% or greater for major osteoporotic fracture. All treatment decisions require clinical judgment and consideration of individual patient factors,  including patient preferences, co-morbidities, previous drug use, risk factors not captured in the FRAX model (e.g. falls, vitamin D deficiency, increased bone turnover, interval significant decline in bone density) and possible under - or over-estimation of fracture risk by FRAX. All patients should ensure an adequate intake of dietary calcium (1200 mg/d) and vitamin D (800 IU daily) unless contraindicated. FOLLOW-UP: People with diagnosed cases of osteoporosis or at high risk for fracture should have regular bone mineral density tests. For patients eligible for Medicare, routine testing is allowed once every 2 years. The testing frequency can be increased to one year for patients who have rapidly progressing disease, those who are receiving or discontinuing medical therapy to restore bone mass, or have additional risk factors. I have reviewed this report, and agree with the above findings. [facility] Radiology Electronically Signed   By: Lahoma Crocker M.D.   On: 04/22/2016 14:26    Assessment & Plan:   Problem List Items Addressed This Visit    Cerebrovascular accident (CVA) ([place])    History of left brain cva with right sided weakness which has worsened due to recent health issues.        Relevant Medications   amLODipine (NORVASC) 5 MG tablet   rosuvastatin (CRESTOR) 40 MG tablet   metoprolol succinate (TOPROL-XL) 100 MG 24 hr tablet   Cerebrovascular disease    Occurring in 2008/2009. She has benefitted from PT and daily swimming, and is still exercising daily.  In spite of this she has had one fall and several near misses,  And would benefit from  Physical therapy to address recent deterioriation due to acute health issues including an appendectomy in September and recent influenza illness.  continue dipyridamole for now,  Workup for Plavix failure should be done by Neurology as I have no experience in interpreting the results.        Relevant Medications   amLODipine (NORVASC) 5 MG tablet    rosuvastatin (CRESTOR) 40 MG tablet   metoprolol succinate (TOPROL-XL) 100 MG 24 hr tablet   Influenza A    Diagnosed on January 10th,  tamifly prescribed and taken with last dose occurring today .  Clinically recovering well,    Pneumovax #2 given today,  Prolia injection postponed for one week        Osteoporosis    With prior thoracic and lumbar fractures,  Prolia injection due today but postponed due to current influenza illness.       S/P appendectomy    Secondary to acute appendicitis with peritonitis.  Sept 2017       Other Visit  Diagnoses    Recurrent falls    -  Primary   Relevant Orders   Ambulatory referral to Physical Therapy   Need for prophylactic vaccination against Streptococcus pneumoniae (pneumococcus)          I have changed Ms. Dileo's amLODipine, ranitidine, and metoprolol succinate. I am also having her maintain her aspirin, vitamin B-12, cholecalciferol, fish oil-omega-3 fatty acids, multivitamin, CALCIUM CITRATE PO, ibuprofen, traMADol, traMADol, oseltamivir, dipyridamole, and rosuvastatin.  Meds ordered this encounter  Medications  . dipyridamole (PERSANTINE) 50 MG tablet    Sig: TAKE 4 TABLETS (200 MG) TWICE A DAY    Dispense:  720 tablet    Refill:  3  . amLODipine (NORVASC) 5 MG tablet    Sig: Take 1 tablet (5 mg total) by mouth daily.    Dispense:  90 tablet    Refill:  3  . rosuvastatin (CRESTOR) 40 MG tablet    Sig: Take 1 tablet (40 mg total) by mouth daily.    Dispense:  90 tablet    Refill:  3  . ranitidine (ZANTAC) 150 MG tablet    Sig: Take 1 tablet (150 mg total) by mouth daily.    Dispense:  90 tablet    Refill:  3  . metoprolol succinate (TOPROL-XL) 100 MG 24 hr tablet    Sig: Take 1 tablet (100 mg total) by mouth daily. Take with or immediately following a meal.    Dispense:  90 tablet    Refill:  3    Medications Discontinued During This Encounter  Medication Reason  . dipyridamole (PERSANTINE) 50 MG tablet Reorder  .  amLODipine (NORVASC) 5 MG tablet Reorder  . rosuvastatin (CRESTOR) 40 MG tablet Reorder  . ranitidine (ZANTAC) 150 MG tablet Reorder  . metoprolol succinate (TOPROL-XL) 100 MG 24 hr tablet Reorder   A total of 40 minutes was spent with patient more than half of which was spent in counseling patient on the above mentioned issues , reviewing and explaining recent labs and imaging studies done, and coordination of care. Follow-up: No Follow-up on file.   Crecencio Mc, MD

## 2016-05-27 DIAGNOSIS — J101 Influenza due to other identified influenza virus with other respiratory manifestations: Secondary | ICD-10-CM | POA: Insufficient documentation

## 2016-05-27 NOTE — Assessment & Plan Note (Signed)
Secondary to acute appendicitis with peritonitis.  Sept 2017

## 2016-05-27 NOTE — Assessment & Plan Note (Addendum)
Diagnosed on January 10th,  tamifly prescribed and taken with last dose occurring today .  Clinically recovering well,    Pneumovax #2 given today,  Prolia injection postponed for one week

## 2016-05-27 NOTE — Assessment & Plan Note (Addendum)
Occurring in 2008/2009. She has benefitted from PT and daily swimming, and is still exercising daily.  In spite of this she has had one fall and several near misses,  And would benefit from  Physical therapy to address recent deterioriation due to acute health issues including an appendectomy in September and recent influenza illness.  continue dipyridamole for now,  Workup for Plavix failure should be done by Neurology as I have no experience in interpreting the results.

## 2016-05-27 NOTE — Assessment & Plan Note (Signed)
With prior thoracic and lumbar fractures,  Prolia injection due today but postponed due to current influenza illness.

## 2016-05-27 NOTE — Assessment & Plan Note (Signed)
History of left brain cva with right sided weakness which has worsened due to recent health issues.

## 2016-06-03 ENCOUNTER — Ambulatory Visit (INDEPENDENT_AMBULATORY_CARE_PROVIDER_SITE_OTHER): Payer: Medicare Other

## 2016-06-03 DIAGNOSIS — M8000XS Age-related osteoporosis with current pathological fracture, unspecified site, sequela: Secondary | ICD-10-CM | POA: Diagnosis not present

## 2016-06-03 MED ORDER — DENOSUMAB 60 MG/ML ~~LOC~~ SOLN
60.0000 mg | Freq: Once | SUBCUTANEOUS | Status: AC
  Administered 2016-06-03: 60 mg via SUBCUTANEOUS

## 2016-06-03 NOTE — Progress Notes (Addendum)
Patient her today for Prolia injection.  Given in right arm subcutaneously.   Patient tolerated injection well.    Reviewed.   Dr Nicki Reaper

## 2016-06-13 ENCOUNTER — Encounter: Payer: Self-pay | Admitting: Physical Therapy

## 2016-06-13 ENCOUNTER — Ambulatory Visit: Payer: Medicare Other | Attending: Internal Medicine | Admitting: Physical Therapy

## 2016-06-13 DIAGNOSIS — R262 Difficulty in walking, not elsewhere classified: Secondary | ICD-10-CM | POA: Diagnosis not present

## 2016-06-13 DIAGNOSIS — R296 Repeated falls: Secondary | ICD-10-CM | POA: Diagnosis not present

## 2016-06-13 NOTE — Therapy (Signed)
[place] MAIN [facility] SERVICES [address] [place], [place], [address] Phone: [phone]   Fax:  [phone]  Physical Therapy Evaluation  Patient Details  Name: Ellen Hamilton MRN: [id number] Date of Birth: [birth year] Referring Provider: recurrent falls  Encounter Date: 06/13/2016      PT End of Session - 06/13/16 0814    Visit Number 1   Number of Visits 17   Date for PT Re-Evaluation 08/08/16   Authorization Type Needs G codes   Authorization Time Period 1/10   PT Start Time 0819   PT Stop Time 0916   PT Time Calculation (min) 57 min   Equipment Utilized During Treatment Gait belt   Activity Tolerance Patient tolerated treatment well   Behavior During Therapy [facility] for tasks assessed/performed      Past Medical History:  Diagnosis Date  . Compression fracture of L5 lumbar vertebra ([place]) 01/2014  . Fibromyalgia   . GERD (gastroesophageal reflux disease)   . History of transient ischemic attack (TIA) [id number]  . Hyperlipidemia   . Hypertension   . Osteoporosis   . Sleep apnea 2009   sleep study done in [place]  . Stroke [facility]) [phone]   pt has had two    Past Surgical History:  Procedure Laterality Date  . BACK SURGERY    . BUNIONECTOMY Bilateral [id number]  . CATARACT EXTRACTION W/ INTRAOCULAR LENS  IMPLANT, BILATERAL  2010  . EYE SURGERY    . kyphoplaasty  2010  . KYPHOPLASTY N/A 02/13/2014   Procedure: KYPHOPLASTY LUMBAR FIVE;  Surgeon: Charlie Pitter, MD;  Location: [place] NEURO ORS;  Service: Neurosurgery;  Laterality: N/A;  . LAPAROSCOPIC APPENDECTOMY N/A 01/11/2016   Procedure: APPENDECTOMY LAPAROSCOPIC;  Surgeon: Florene Glen, MD;  Location: [facility] ORS;  Service: General;  Laterality: N/A;    There were no vitals filed for this visit.       Subjective Assessment - 06/17/16 0804    Subjective Patient states she did not get to go to the gym this morning to warm up her muscles.   Pertinent History Patient  states she had her appendix out in September 2017. Patient and her husband state that patient has had a decline in her mobility and balance since the surgery. Patient reports she has had 3-4 falls in the past six months and several near miss falls. Patient states she had a fall in the kitchen when she turned to reach for something. Patient states she cannot do quick turns. Patient states she is afraid of falling and states she feels she does not have the same stability as she did before. Patient feels her gait has deteriorted and is using her cane all the time. They report that the patient is not picking up her feet and catches her left toe instead of heel strike first. Patient exercises daily including going to the pool 5 days a week, doing the Nustep machine for 15-20 minutes daily, treadmill walking 7 minutes and sled. In addition, patient does Theraband exercises and works on high stepping and ladder work. Patient does about 15-20 minutes of these exercises. Patient states she had a kyphoplasty due to a compression fracture from straining using the restroom in August 2015 and the surgery was October 2015. Patient has a history of multiple compression fractures involving T8, T12 and L5 per patient and husband report. Patient has a history of osteoporosis and patient suffered from TIA in 2008 and stroke in 2009 affecting primarily her right  leg. Patient states she had a kyphoplasty due to a compression fracture from straining using the restroom in August 2015 and the surgery was October 2015. Patient has a history of multiple compression fractures involving T8, T12 and L5 per patient and husband report. Patient has a history of osteoporosis and patient suffered from TIA in 2008 and stroke in 2009 affecting primarily her right leg.        VESTIBULAR AND BALANCE EVALUATION    HISTORY:  Subjective history of current problem:  Patient states she had her appendix out in September 2017. Patient and her husband  state that patient has had a decline in her mobility and balance since the surgery. Patient reports she has had 3-4 falls in the past six months and several near miss falls. Patient states she had a fall in the kitchen when she turned to reach for something. Patient states she cannot do quick turns. Patient states she is afraid of falling and states she feels she does not have the same stability as she did before. Patient feels her gait has deteriorted and is using her cane all the time. They report that the patient is not picking up her feet and catches her left toe instead of heel strike first. Patient exercises daily including going to the pool 5 days a week, doing the Nustep machine for 15-20 minutes daily, treadmill walking 7 minutes and sled. In addition, patient does Theraband exercises and works on high stepping and ladder work. Patient does about 15-20 minutes of these exercises.  Patient states she had a kyphoplasty due to a compression fracture from straining using the restroom in August 2015 and the surgery was October 2015. Patient has a history of multiple compression fractures involving T8, T12 and L5 per patient and husband report. Patient has a history of osteoporosis and patient suffered from TIA in 2008 and stroke in 2009 affecting primarily her right leg. In addition, patient with fibromyalgia per MR.   Falls (yes/no): yes Number of falls in past 6 months: 3-4 falls; patient reports several near miss falls, 2-3 times.   Prior Functional Level: Patient started using SPC at least a year ago.    Current Symptoms: (vertigo, N & V, dysarthria, dysphagia, drop attacks, bowel and bladder changes, recent weight loss/gain, lightheadedness, headache, rocking, general unsteadiness, imbalance, tilting, swaying, veering, dizzy, oscillopsia, migraines) Review of systems negative for red flags.   EXAMINATION  POSTURE:  Rounded shoulders, slumped sitting posture with decreased lordosis in  sitting   SOMATOSENSORY:   Any N & T in extremities or weakness: Patient reports right hand arthritic changes states she has to work her hand and do something with her hand to loosen it up.  COORDINATION: Finger to Nose:   Normal Past Pointing:     Normal  MUSCULOSKELETAL SCREEN:   ROM:   AROM B UEs and LEs WFL except right DF AROM grossly less than 10 degrees and able to hold +3/5 within available AROM.    MMT:   R and L shoulder flexors 5/5 R bicep and tricep 5/5  L bicep 4/5  L tricep 4/5  L and R hip flexors +3/5 with discomfort in thigh bilaterally L quad +3/5 with discomfort posterior leg Right quad 4/5  L DF 5/5 R DF about less than 10degrees and able to hold +3/5 within available AROM.   Functional Mobility: Patient reports she is not having difficulty with getting in or out of bed except on days she is hurting. Patient  states she is doing log rolling.  Patient reports sometimes she has difficulty controlling eccentric descent with stand to sit. Patient reports difficulty with turning. Patient reports difficulty with initial standing and being unsteady.   Gait: Patient ambulates with SPC with decreased cadence with decreased right foot toe off and decreased heel strike at initial contact. Patient with decreased right leg knee and hip flexion in swing phase and right leg ER with toe out. Patient has some difficulty with initiating gait as first steps are more halting in addition patient demonstrates difficulty with turning requiring increased time and taking multiple small steps to accomplish task.  Scanning of visual environment with gait is: fair  Balance: Patient demonstrates difficulty with dynamic balance components, turning, narrow BOS and single limb stance.     FUNCTIONAL OUTCOME MEASURES:  Results Comments  TUG  19 seconds High falls risk; in need of intervention  TUG cognitive 20 seconds High falls risk; in need of intervention  BERG 42/56 Fall risk; in  need of intervention  ABC scale 33% High falls risk; in need of intervention  10 meter Walking Speed 0.57 m/sec Decreased walking speed as compared to age and gender normative values; in need of intervention         [facility] PT Assessment - 06/17/16 1123      Assessment   Medical Diagnosis Dr. Derrel Nip   Prior Therapy yes; patient has recieved PT services after she suffered TIA and stroke and again in 2015 for balance and walking and 2016 for her back     Precautions   Precautions Fall  significant history-osteoporosis with compression fractures     Restrictions   Weight Bearing Restrictions No     Home Environment   Living Environment Other (Comment)  [place] Spouse/significant other   Available Help at Discharge Family;Friend(s)   Type of [place] One level   [place] - single point;Wheelchair - manual;Grab bars - tub/shower;Walker - 2 wheels;Walker - 4 wheels     Prior Function   Level of Independence Independent with community mobility with device;Independent with basic ADLs;Independent with gait;Independent with transfers     Cognition   Overall Cognitive Status Within Functional Limits for tasks assessed     Coordination   Fine Motor Movements are Fluid and Coordinated Yes   Finger Nose Finger Test accurate finger to nose bilaterally without pass pointing     Posture/Postural Control   Posture/Postural Control Postural limitations   Postural Limitations Rounded Shoulders;Posterior pelvic tilt     Standardized Balance Assessment   Standardized Balance Assessment Berg Balance Test;Timed Up and Go Test     Berg Balance Test   Sit to Stand Able to stand without using hands and stabilize independently   Standing Unsupported Able to stand 2 minutes with supervision   Sitting with Back Unsupported but Feet Supported on Floor or Stool Able to sit safely and  securely 2 minutes   Stand to Sit Sits safely with minimal use of hands   Transfers Able to transfer safely, minor use of hands   Standing Unsupported with Eyes Closed Able to stand 10 seconds safely   Standing Ubsupported with Feet Together Able to place feet together independently and stand for 1 minute with supervision   From Standing, Reach Forward with Outstretched Arm Can reach confidently >25 cm (10")   From Standing Position, Pick up Object from  Floor Able to pick up shoe, needs supervision   From Standing Position, Turn to Look Behind Over each Shoulder Looks behind one side only/other side shows less weight shift   Turn 360 Degrees Needs close supervision or verbal cueing  7 seconds right and 8 seconds to the left   Standing Unsupported, Alternately Place Feet on Step/Stool Able to complete 4 steps without aid or supervision   Standing Unsupported, One Foot in Front Able to plae foot ahead of the other independently and hold 30 seconds   Standing on One Leg Unable to try or needs assist to prevent fall   Total Score 42     Timed Up and Go Test   Normal TUG (seconds) 19   Cognitive TUG (seconds) 20                           PT Education - 06/13/16 0814    Education provided Yes   Education Details Discussed plan of care and goals   Person(s) Educated Patient;Spouse   Methods Explanation   Comprehension Verbalized understanding             PT Long Term Goals - 06/13/16 0817      PT LONG TERM GOAL #1   Title Patient will reduce falls risk as indicated by Activities Specific Balance Confidence Scale (ABC) >67% by 08/08/2016.   Baseline scored 33% on 06/13/2016.   Time 8   Period Weeks   Status New     PT LONG TERM GOAL #2   Title Patient will be able to perform home program independently for self-management.   Time 8   Period Weeks   Status New     PT LONG TERM GOAL #3   Title Patient will improve self-selected gait speed to 0.7 m/s or greater  on 10 m walk to indicate reduced falls risk, improved community mobility and independence with ADL's.   Baseline average gait speed on 06/13/2016 was 0.59 m/sec   Time 8   Period Weeks   Status New     PT LONG TERM GOAL #4   Title Patient will score less than 14 seconds on the TUG test to indicate reduced falls risk.    Baseline scored 19 sec on 06/13/2016   Time 8   Period Weeks   Status New     PT LONG TERM GOAL #5   Title Patient will reduce falls risk as indicated by Merrilee Jansky Balance Scale Score of 49/56 or greater.   Baseline scored 42/56 on 06/13/2016   Time 8   Period Weeks   Status New               Plan - 06/13/16 0815    Clinical Impression Statement Patient known to this writer and patient returns to clinic with reports of multiple falls in the past six months and concerns of decreasing balance, strength and gait quality. Patient at time of discharge from PT services in 2015 was ambulating without AD. Patient is at high falls risk and would benefit from PT services to address functional deficits, goals as set on plan of care and to help decrease patient's falls risk.    Rehab Potential Fair   Clinical Impairments Affecting Rehab Potential Positive indicators: motivated, family support     Negative indicators: prior CVA with residual deficits primarily affecting right LE   PT Frequency 2x / week   PT Duration 8 weeks   PT  Treatment/Interventions Balance training;Therapeutic exercise;Therapeutic activities;Gait training;Stair training;Neuromuscular re-education;Patient/family education;Functional mobility training   PT Next Visit Plan Hip flexor, abduction and adductor strengthening bilaterally, left knee quad and ham strengthening, sit to stand, standing mini squats, firm and Airex FT and ST progressions, issued HEP, score LFES and write goal if appropriate   Consulted and Agree with Plan of Care Patient;Family member/caregiver   Family Member Consulted husband- Andreanna Mcdaniel       Patient will benefit from skilled therapeutic intervention in order to improve the following deficits and impairments:  Decreased balance, Difficulty walking, Decreased strength, Decreased range of motion, Decreased mobility, Pain  Visit Diagnosis: Difficulty in walking, not elsewhere classified  Repeated falls      G-Codes - Jun 19, 2016 0817    Functional Assessment Tool Used clinical judgment, BERG, TUG, TUG cognitive, ABC scale, 10 meter walking speed   Functional Limitation Mobility: Walking and moving around   Mobility: Walking and Moving Around Current Status [phone]) At least 60 percent but less than 80 percent impaired, limited or restricted   Mobility: Walking and Moving Around Goal Status [phone]) At least 20 percent but less than 40 percent impaired, limited or restricted       Problem List Patient Active Problem List   Diagnosis Date Noted  . Influenza A 05/27/2016  . Acne rosacea 01/15/2016  . Colonic diverticular disease 01/15/2016  . Obesity 01/15/2016  . S/P appendectomy   . Acne erythematosa 10/18/2015  . Adiposity 10/18/2015  . Allergic rhinitis 10/18/2015  . Cerebrovascular accident (CVA) ([place]) 10/18/2015  . Compression fracture of thoracic vertebra ([place]) 10/18/2015  . Diverticulosis of large intestine without perforation or abscess without bleeding 10/18/2015  . Esophagitis 10/18/2015  . Fibromyalgia 10/18/2015  . Candidiasis of breast 04/28/2015  . Candida glabrata infection 04/28/2015  . Long-term use of high-risk medication 04/13/2014  . History of high risk medication treatment 04/13/2014  . Lumbar compression fracture ([place]) 02/13/2014  . Compression fracture of L5 lumbar vertebra ([place]) 01/25/2014  . Compression fracture of lumbar vertebra ([place]) 01/25/2014  . Medicare annual wellness visit, subsequent 09/28/2012  . Encounter for general adult medical examination without abnormal findings 09/28/2012  . History of thyroid nodule 06/19/12  . Benign  colonic polyp 19-Jun-2012  . History of thyroid disease 2012-06-19  . OSA on CPAP 06/06/2012  . Cerebrovascular disease 06/04/2012  . Hypertension 06/04/2012  . Hyperlipidemia 06/04/2012  . Osteoporosis 06/04/2012   Lady Deutscher PT, DPT Lady Deutscher 06/19/16, 4:17 PM  [place] MAIN [facility] SERVICES [address] [place], [place], [address] Phone: [phone]   Fax:  [phone]  Name: Marnetta Monsma MRN: [id number] Date of Birth: December 07, 1950

## 2016-06-17 ENCOUNTER — Ambulatory Visit: Payer: Medicare Other | Admitting: Physical Therapy

## 2016-06-17 ENCOUNTER — Encounter: Payer: Self-pay | Admitting: Physical Therapy

## 2016-06-17 DIAGNOSIS — R296 Repeated falls: Secondary | ICD-10-CM

## 2016-06-17 DIAGNOSIS — R262 Difficulty in walking, not elsewhere classified: Secondary | ICD-10-CM

## 2016-06-17 NOTE — Therapy (Addendum)
[place] MAIN [facility] SERVICES [address] [place], [place], [address] Phone: [phone]   Fax:  [phone]  Physical Therapy Treatment  Patient Details  Name: Ellen Hamilton MRN: [id number] Date of Birth: 03-10-1951 Referring Provider: recurrent falls  Encounter Date: 06/17/2016      PT End of Session - 06/17/16 1448    Visit Number 2   Number of Visits 17   Date for PT Re-Evaluation 08/08/16   Authorization Type Needs G codes   Authorization Time Period 1/10   PT Start Time 0133   PT Stop Time 0220   PT Time Calculation (min) 47 min   Equipment Utilized During Treatment Gait belt   Activity Tolerance Patient tolerated treatment well;Patient limited by fatigue   Behavior During Therapy WFL for tasks assessed/performed      Past Medical History:  Diagnosis Date  . Compression fracture of L5 lumbar vertebra ([place]) 01/2014  . Fibromyalgia   . GERD (gastroesophageal reflux disease)   . History of transient ischemic attack (TIA) [id number]  . Hyperlipidemia   . Hypertension   . Osteoporosis   . Sleep apnea 2009   sleep study done in [place]  . Stroke [facility]) [phone]   pt has had two    Past Surgical History:  Procedure Laterality Date  . BACK SURGERY    . BUNIONECTOMY Bilateral [id number]  . CATARACT EXTRACTION W/ INTRAOCULAR LENS  IMPLANT, BILATERAL  2010  . EYE SURGERY    . kyphoplaasty  2010  . KYPHOPLASTY N/A 02/13/2014   Procedure: KYPHOPLASTY LUMBAR FIVE;  Surgeon: Charlie Pitter, MD;  Location: [place] [facility] ORS;  Service: Neurosurgery;  Laterality: N/A;  . LAPAROSCOPIC APPENDECTOMY N/A 01/11/2016   Procedure: APPENDECTOMY LAPAROSCOPIC;  Surgeon: Florene Glen, MD;  Location: [facility] ORS;  Service: General;  Laterality: N/A;    There were no vitals filed for this visit.      Subjective Assessment - 06/17/16 1446    Subjective Patient states that she has been trying to work on alternating toe taps in the pool.    Patient is accompained by: Family member   Pertinent History Patient states she had her appendix out in September 2017. Patient and her husband state that patient has had a decline in her mobility and balance since the surgery. Patient reports she has had 3-4 falls in the past six months and several near miss falls. Patient states she had a fall in the kitchen when she turned to reach for something. Patient states she cannot do quick turns. Patient states she is afraid of falling and states she feels she does not have the same stability as she did before. Patient feels her gait has deteriorted and is using her cane all the time. They report that the patient is not picking up her feet and catches her left toe instead of heel strike first. Patient exercises daily including going to the pool 5 days a week, doing the Nustep machine for 15-20 minutes daily, treadmill walking 7 minutes and sled. In addition, patient does Theraband exercises and works on high stepping and ladder work. Patient does about 15-20 minutes of these exercises. Patient states she had a kyphoplasty due to a compression fracture from straining using the restroom in August 2015 and the surgery was October 2015. Patient has a history of multiple compression fractures involving T8, T12 and L5 per patient and husband report. Patient has a history of osteoporosis and patient suffered from TIA in 2008  and stroke in 2009 affecting primarily her right leg. Patient states she had a kyphoplasty due to a compression fracture from straining using the restroom in August 2015 and the surgery was October 2015. Patient has a history of multiple compression fractures involving T8, T12 and L5 per patient and husband report. Patient has a history of osteoporosis and patient suffered from TIA in 2008 and stroke in 2009 affecting primarily her right leg.     Patient Stated Goals patient states she would like to be able to walk better, be able to turn around better and  to have improved balance   Currently in Pain? --  reports her legs are sore today; not rated      Therapeutic Exercise:  Patient performed seated in wheeled chair 2 laps about (150' each) of forward and retro propulsion of self in chair working on quads and hamstring strengthening. Patient reports fatigue in leg muscles with this exercise.   Patient performed 2 sets of 12 reps each in side-lying clam shells AROM with verbal cues to not rock pelvis backwards but rather keep pelvis stacked and engage the hip abductor muscles. Patient had increased difficulty with performing clam shells with right leg.   Patient performed 2 sets of 15 reps bilateral bridging with verbal cues to breath out while lifting up and to engage abdominal muscles. Patient did well at maintaining a level pelvis while lifting up and engaging her gluteal muscles.   Patient performed in // bars slow marching 10 reps each side with 2-3 second holds with faded UEs support progressing from multiple fingers each hand to one finger support of left and right hand. Patient reports fatigue in her gluteal muscles with this exercise.   Patient required several small rest breaks during session between reps of exercises due to lower extremity fatigue.        PT Education - 06/17/16 1447    Education provided Yes   Education Details Discussed exercises for HEP; patient's husband took notes during session of the exercises for HEP and he declined a printout    Person(s) Educated Patient;Spouse   Methods Explanation;Demonstration;Verbal cues   Comprehension Verbalized understanding;Returned demonstration             PT Long Term Goals - 06/13/16 0817      PT LONG TERM GOAL #1   Title Patient will reduce falls risk as indicated by Activities Specific Balance Confidence Scale (ABC) >67% by 08/08/2016.   Baseline scored 33% on 06/13/2016.   Time 8   Period Weeks   Status New     PT LONG TERM GOAL #2   Title Patient will be  able to perform home program independently for self-management.   Time 8   Period Weeks   Status New     PT LONG TERM GOAL #3   Title Patient will improve self-selected gait speed to 0.7 m/s or greater on 10 m walk to indicate reduced falls risk, improved community mobility and independence with ADL's.   Baseline average gait speed on 06/13/2016 was 0.59 m/sec   Time 8   Period Weeks   Status New     PT LONG TERM GOAL #4   Title Patient will score less than 14 seconds on the TUG test to indicate reduced falls risk.    Baseline scored 19 sec on 06/13/2016   Time 8   Period Weeks   Status New     PT LONG TERM GOAL #5   Title Patient  will reduce falls risk as indicated by Merrilee Jansky Balance Scale Score of 49/56 or greater.   Baseline scored 42/56 on 06/13/2016   Time 8   Period Weeks   Status New               Plan - 06/17/16 1448    Clinical Impression Statement Patient working on supine and sidelying mat exercises to strengthen hip and abdominal muscles. Patient requiring cuing for technique and for breathing during exercises as well as cues to avoid muscle substitutions. Patient required multiple short rest breaks during session secondary to muscle fatigue. Patient would benefit from continued PT services to continue to address functional deficits in order to maximize patient's functional status and to help patient return to her baseline.    Rehab Potential Fair   Clinical Impairments Affecting Rehab Potential Positive indicators: motivated, family support     Negative indicators: prior CVA with residual deficits primarily affecting right LE   PT Frequency 2x / week   PT Duration 8 weeks   PT Treatment/Interventions Balance training;Therapeutic exercise;Therapeutic activities;Gait training;Stair training;Neuromuscular re-education;Patient/family education;Functional mobility training   PT Next Visit Plan continue hip flexor, abduction and adductor strengthening bilaterally, left knee  quad and ham strengthening, sit to stand, standing mini squats, firm and Airex FT and ST progressions,  score LFES and write goal if appropriate   PT Home Exercise Plan sidelying clam shells, bridging, SLR, slow marching with faded UEs assist   Consulted and Agree with Plan of Care Patient;Family member/caregiver   Family Member Consulted husband- Zoriana Limbaugh      Patient will benefit from skilled therapeutic intervention in order to improve the following deficits and impairments:  Decreased balance, Difficulty walking, Decreased strength, Decreased range of motion, Decreased mobility, Pain  Visit Diagnosis: Difficulty in walking, not elsewhere classified  Repeated falls     Problem List Patient Active Problem List   Diagnosis Date Noted  . Influenza A 05/27/2016  . Acne rosacea 01/15/2016  . Colonic diverticular disease 01/15/2016  . Obesity 01/15/2016  . S/P appendectomy   . Acne erythematosa 10/18/2015  . Adiposity 10/18/2015  . Allergic rhinitis 10/18/2015  . Cerebrovascular accident (CVA) ([place]) 10/18/2015  . Compression fracture of thoracic vertebra ([place]) 10/18/2015  . Diverticulosis of large intestine without perforation or abscess without bleeding 10/18/2015  . Esophagitis 10/18/2015  . Fibromyalgia 10/18/2015  . Candidiasis of breast 04/28/2015  . Candida glabrata infection 04/28/2015  . Long-term use of high-risk medication 04/13/2014  . History of high risk medication treatment 04/13/2014  . Lumbar compression fracture ([place]) 02/13/2014  . Compression fracture of L5 lumbar vertebra ([place]) 01/25/2014  . Compression fracture of lumbar vertebra ([place]) 01/25/2014  . Medicare annual wellness visit, subsequent 09/28/2012  . Encounter for general adult medical examination without abnormal findings 09/28/2012  . History of thyroid nodule 06/13/2012  . Benign colonic polyp 06/13/2012  . History of thyroid disease 06/13/2012  . OSA on CPAP 06/06/2012  . Cerebrovascular  disease 06/04/2012  . Hypertension 06/04/2012  . Hyperlipidemia 06/04/2012  . Osteoporosis 06/04/2012   Lady Deutscher PT, DPT Lady Deutscher 06/17/2016, 3:13 PM  [place] MAIN [facility] SERVICES [address] [place], [place], [address] Phone: [phone]   Fax:  [phone]  Name: Ellen Hamilton MRN: [id number] Date of Birth: [birth year]

## 2016-06-20 ENCOUNTER — Encounter: Payer: Self-pay | Admitting: Physical Therapy

## 2016-06-20 ENCOUNTER — Ambulatory Visit: Payer: Medicare Other | Admitting: Physical Therapy

## 2016-06-20 DIAGNOSIS — R262 Difficulty in walking, not elsewhere classified: Secondary | ICD-10-CM

## 2016-06-20 DIAGNOSIS — R296 Repeated falls: Secondary | ICD-10-CM

## 2016-06-20 NOTE — Therapy (Addendum)
[place] MAIN [facility] SERVICES [address] [place], [place], [address] Phone: [phone]   Fax:  [phone]  Physical Therapy Treatment  Patient Details  Name: Ellen Hamilton MRN: [id number] Date of Birth: 1950-07-22 Referring Provider: recurrent falls  Encounter Date: 06/20/2016      PT End of Session - 06/20/16 1304    Visit Number 4   Number of Visits 17   Date for PT Re-Evaluation 08/08/16   Authorization Type Needs G codes   Authorization Time Period 2/10   PT Start Time 0104   PT Stop Time 0200   PT Time Calculation (min) 56 min   Equipment Utilized During Treatment --   Activity Tolerance Patient tolerated treatment well;Patient limited by fatigue   Behavior During Therapy [facility] for tasks assessed/performed      Past Medical History:  Diagnosis Date  . Compression fracture of L5 lumbar vertebra ([place]) 01/2014  . Fibromyalgia   . GERD (gastroesophageal reflux disease)   . History of transient ischemic attack (TIA) [id number]  . Hyperlipidemia   . Hypertension   . Osteoporosis   . Sleep apnea 2009   sleep study done in [place]  . Stroke [facility]) [phone]   pt has had two    Past Surgical History:  Procedure Laterality Date  . BACK SURGERY    . BUNIONECTOMY Bilateral [id number]  . CATARACT EXTRACTION W/ INTRAOCULAR LENS  IMPLANT, BILATERAL  2010  . EYE SURGERY    . kyphoplaasty  2010  . KYPHOPLASTY N/A 02/13/2014   Procedure: KYPHOPLASTY LUMBAR FIVE;  Surgeon: Charlie Pitter, MD;  Location: [place] [facility]URO ORS;  Service: Neurosurgery;  Laterality: N/A;  . LAPAROSCOPIC APPENDECTOMY N/A 01/11/2016   Procedure: APPENDECTOMY LAPAROSCOPIC;  Surgeon: Florene Glen, MD;  Location: [facility] ORS;  Service: General;  Laterality: N/A;    There were no vitals filed for this visit.      Subjective Assessment - 06/20/16 1303    Subjective Patient states she did about 30 minutes of Nustep and treadmill walking this morning before therapy.  Patient states her legs were a little sore the next day or two after last session.    Patient is accompained by: Family member   Pertinent History Patient states she had her appendix out in September 2017. Patient and her husband state that patient has had a decline in her mobility and balance since the surgery. Patient reports she has had 3-4 falls in the past six months and several near miss falls. Patient states she had a fall in the kitchen when she turned to reach for something. Patient states she cannot do quick turns. Patient states she is afraid of falling and states she feels she does not have the same stability as she did before. Patient feels her gait has deteriorted and is using her cane all the time. They report that the patient is not picking up her feet and catches her left toe instead of heel strike first. Patient exercises daily including going to the pool 5 days a week, doing the Nustep machine for 15-20 minutes daily, treadmill walking 7 minutes and sled. In addition, patient does Theraband exercises and works on high stepping and ladder work. Patient does about 15-20 minutes of these exercises. Patient states she had a kyphoplasty due to a compression fracture from straining using the restroom in August 2015 and the surgery was October 2015. Patient has a history of multiple compression fractures involving T8, T12 and L5 per patient  and husband report. Patient has a history of osteoporosis and patient suffered from TIA in 2008 and stroke in 2009 affecting primarily her right leg. Patient states she had a kyphoplasty due to a compression fracture from straining using the restroom in August 2015 and the surgery was October 2015. Patient has a history of multiple compression fractures involving T8, T12 and L5 per patient and husband report. Patient has a history of osteoporosis and patient suffered from TIA in 2008 and stroke in 2009 affecting primarily her right leg.     Patient Stated Goals  patient states she would like to be able to walk better, be able to turn around better and to have improved balance   Currently in Pain? Yes   Pain Location Leg   Pain Orientation Right;Left   Pain Descriptors / Indicators Sore      Therapeutic Exercise:    Patient performed seated in wheeled chair 2 laps about (150' each) of forward and retro propulsion of self in chair working on quads and hamstring strengthening. Patient reports fatigue in posterior leg muscles with this exercise and that forward propulsion is much more difficult than reverse.   Patient performed 1 set of 15 reps and 1 set of 10 reps each left and right side-lying clam shells with Red T-band resistance with verbal cues to not rock pelvis backwards but rather keep pelvis stacked and engage the hip abductor muscles. Patient had increased difficulty with performing clam shells with right leg as compared to left leg.   Patient performed 2 setd of 12 reps each left and right side-lying reverse clam shells with Red T-band resistance with verbal cues to keep knees together and to keep pelvis stabile.   Patient performed left and right side-lying hip abduction AROM with 3 second holds 2 sets of 10 reps. Attempted supine with right knee flexed left hip abduction but patient reported pulling sensation in left adductors that was uncomfortable and patient was better able to tolerate side-lying position.         PT Education - 06/20/16 1303    Education provided Yes   Education Details Reviewed HEP and added additional exercises of reverse clam shells and hip abduction in sidelying   Person(s) Educated Patient;Spouse   Methods Explanation   Comprehension Verbalized understanding             PT Long Term Goals - 06/13/16 0817      PT LONG TERM GOAL #1   Title Patient will reduce falls risk as indicated by Activities Specific Balance Confidence Scale (ABC) >67% by 08/08/2016.   Baseline scored 33% on 06/13/2016.   Time 8    Period Weeks   Status New     PT LONG TERM GOAL #2   Title Patient will be able to perform home program independently for self-management.   Time 8   Period Weeks   Status New     PT LONG TERM GOAL #3   Title Patient will improve self-selected gait speed to 0.7 m/s or greater on 10 m walk to indicate reduced falls risk, improved community mobility and independence with ADL's.   Baseline average gait speed on 06/13/2016 was 0.59 m/sec   Time 8   Period Weeks   Status New     PT LONG TERM GOAL #4   Title Patient will score less than 14 seconds on the TUG test to indicate reduced falls risk.    Baseline scored 19 sec on 06/13/2016   Time  8   Period Weeks   Status New     PT LONG TERM GOAL #5   Title Patient will reduce falls risk as indicated by Merrilee Jansky Balance Scale Score of 49/56 or greater.   Baseline scored 42/56 on 06/13/2016   Time 8   Period Weeks   Status New               Plan - 06/20/16 1304    Clinical Impression Statement Patient able to add Theraband resistance to several exercises this date. Patient doing better with breathing technique during therapeutic exercises but continues to need verbal and tactile cuing for proper technique. Patient reporting good compliance with home exercise program. Patient would benefit from continued PT services to address balance and strengthening exercises.    Rehab Potential Fair   Clinical Impairments Affecting Rehab Potential Positive indicators: motivated, family support     Negative indicators: prior CVA with residual deficits primarily affecting right LE   PT Frequency 2x / week   PT Duration 8 weeks   PT Treatment/Interventions Balance training;Therapeutic exercise;Therapeutic activities;Gait training;Stair training;Neuromuscular re-education;Patient/family education;Functional mobility training   PT Next Visit Plan continue hip flexor, abduction and adductor strengthening bilaterally, left knee quad and ham strengthening, sit  to stand, standing mini squats, firm and Airex FT and ST progressions,  score LFES and write goal if appropriate   PT Home Exercise Plan sidelying clam shells, bridging, SLR, slow marching with faded UEs assist   Consulted and Agree with Plan of Care Patient;Family member/caregiver   Family Member Consulted husband- Krishma Janikowski      Patient will benefit from skilled therapeutic intervention in order to improve the following deficits and impairments:  Decreased balance, Difficulty walking, Decreased strength, Decreased range of motion, Decreased mobility, Pain  Visit Diagnosis: Difficulty in walking, not elsewhere classified  Repeated falls     Problem List Patient Active Problem List   Diagnosis Date Noted  . Influenza A 05/27/2016  . Acne rosacea 01/15/2016  . Colonic diverticular disease 01/15/2016  . Obesity 01/15/2016  . S/P appendectomy   . Acne erythematosa 10/18/2015  . Adiposity 10/18/2015  . Allergic rhinitis 10/18/2015  . Cerebrovascular accident (CVA) ([place]) 10/18/2015  . Compression fracture of thoracic vertebra ([place]) 10/18/2015  . Diverticulosis of large intestine without perforation or abscess without bleeding 10/18/2015  . Esophagitis 10/18/2015  . Fibromyalgia 10/18/2015  . Candidiasis of breast 04/28/2015  . Candida glabrata infection 04/28/2015  . Long-term use of high-risk medication 04/13/2014  . History of high risk medication treatment 04/13/2014  . Lumbar compression fracture ([place]) 02/13/2014  . Compression fracture of L5 lumbar vertebra ([place]) 01/25/2014  . Compression fracture of lumbar vertebra ([place]) 01/25/2014  . Medicare annual wellness visit, subsequent 09/28/2012  . Encounter for general adult medical examination without abnormal findings 09/28/2012  . History of thyroid nodule 06/13/2012  . Benign colonic polyp 06/13/2012  . History of thyroid disease 06/13/2012  . OSA on CPAP 06/06/2012  . Cerebrovascular disease 06/04/2012  . Hypertension  06/04/2012  . Hyperlipidemia 06/04/2012  . Osteoporosis 06/04/2012   Lady Deutscher PT, DPT Lady Deutscher 06/20/2016, 2:23 PM  [place] MAIN [facility] SERVICES [address] [place], [place], [address] Phone: [phone]   Fax:  [phone]  Name: Bobbiesue Verstraete MRN: [id number] Date of Birth: [birth year]

## 2016-06-24 ENCOUNTER — Encounter: Payer: Self-pay | Admitting: Physical Therapy

## 2016-06-24 ENCOUNTER — Ambulatory Visit: Payer: Medicare Other | Admitting: Physical Therapy

## 2016-06-24 DIAGNOSIS — R296 Repeated falls: Secondary | ICD-10-CM | POA: Diagnosis not present

## 2016-06-24 DIAGNOSIS — R262 Difficulty in walking, not elsewhere classified: Secondary | ICD-10-CM

## 2016-06-24 NOTE — Therapy (Signed)
[place] MAIN [facility] SERVICES [address] [place], [place], [address] Phone: [phone]   Fax:  [phone]  Physical Therapy Treatment  Patient Details  Name: Ellen Hamilton MRN: [id number] Date of Birth: 17-Oct-1950 Referring Provider: recurrent falls  Encounter Date: 06/24/2016      PT End of Session - 06/24/16 0924    Visit Number 5   Number of Visits 17   Date for PT Re-Evaluation 08/08/16   Authorization Type Needs G codes Oct 14, 2022   Authorization Time Period --   PT Start Time 0901   PT Stop Time 0947   PT Time Calculation (min) 46 min   Equipment Utilized During Treatment Gait belt   Activity Tolerance Patient tolerated treatment well;Patient limited by fatigue   Behavior During Therapy WFL for tasks assessed/performed      Past Medical History:  Diagnosis Date  . Compression fracture of L5 lumbar vertebra ([place]) 01/2014  . Fibromyalgia   . GERD (gastroesophageal reflux disease)   . History of transient ischemic attack (TIA) [id number]  . Hyperlipidemia   . Hypertension   . Osteoporosis   . Sleep apnea 2009   sleep study done in [place]  . Stroke [facility]) [id number]   pt has had two    Past Surgical History:  Procedure Laterality Date  . BACK SURGERY    . BUNIONECTOMY Bilateral [id number]  . CATARACT EXTRACTION W/ INTRAOCULAR LENS  IMPLANT, BILATERAL  2010  . EYE SURGERY    . kyphoplaasty  2010  . KYPHOPLASTY N/A 02/13/2014   Procedure: KYPHOPLASTY LUMBAR FIVE;  Surgeon: Charlie Pitter, MD;  Location: [place] NEURO ORS;  Service: Neurosurgery;  Laterality: N/A;  . LAPAROSCOPIC APPENDECTOMY N/A 01/11/2016   Procedure: APPENDECTOMY LAPAROSCOPIC;  Surgeon: Florene Glen, MD;  Location: [facility] ORS;  Service: General;  Laterality: N/A;    There were no vitals filed for this visit.      Subjective Assessment - 06/24/16 [id number]    Subjective Patient states she was sore over the weekend from doing the home exercise program.    Patient is accompained by: Family member   Pertinent History Patient states she had her appendix out in September 2017. Patient and her husband state that patient has had a decline in her mobility and balance since the surgery. Patient reports she has had 3-4 falls in the past six months and several near miss falls. Patient states she had a fall in the kitchen when she turned to reach for something. Patient states she cannot do quick turns. Patient states she is afraid of falling and states she feels she does not have the same stability as she did before. Patient feels her gait has deteriorted and is using her cane all the time. They report that the patient is not picking up her feet and catches her left toe instead of heel strike first. Patient exercises daily including going to the pool 5 days a week, doing the Nustep machine for 15-20 minutes daily, treadmill walking 7 minutes and sled. In addition, patient does Theraband exercises and works on high stepping and ladder work. Patient does about 15-20 minutes of these exercises. Patient states she had a kyphoplasty due to a compression fracture from straining using the restroom in August 2015 and the surgery was October 2015. Patient has a history of multiple compression fractures involving T8, T12 and L5 per patient and husband report. Patient has a history of osteoporosis and patient suffered from TIA in 2008 and  stroke in 2009 affecting primarily her right leg. Patient states she had a kyphoplasty due to a compression fracture from straining using the restroom in August 2015 and the surgery was October 2015. Patient has a history of multiple compression fractures involving T8, T12 and L5 per patient and husband report. Patient has a history of osteoporosis and patient suffered from TIA in 2008 and stroke in 2009 affecting primarily her right leg.     Patient Stated Goals patient states she would like to be able to walk better, be able to turn around better and  to have improved balance   Currently in Pain? No/denies      Therapeutic Exercise:  Patient performed 2 sets of 12 reps left and right side-lying clam shells with Red T-band resistance. Patient able to keep pelvis stacked and engage the hip abductor muscles without manuel cuing this date. Patient continues to have increased difficulty with performing clam shells with right leg as compared to left leg.   Patient performed 2 sets of 10 reps each left and right side-lying reverse clam shells with Red T-band resistance.   Patient performed left and right side-lying hip ADDuction AROM with 3 second holds 2 sets of 10 reps. Patient reporting she could "feel that in my hips".   Patient performed standing slow marching with 3 second holds with few finger support bilaterally with verbal cues for technique as patient allowing hip to drop on supporting stance leg until cued. Patient with increased difficulty with SLS on right side as compared to left.   Neuromuscular Re-education:  Cone tapping: On firm surface, patient performed alternate foot tapping to cones in series of one, two and then three targets as called out by therapist. Patient progressed from bilateral few finger support to no UEs support with patient placing her arms on her thighs to brace for support. Patient requiring assistance ranging CGA with this exercise.         PT Education - 06/24/16 0924    Education provided Yes   Education Details Reviewed HEP and added standing slow marching to HEP   Person(s) Educated Patient;Spouse   Methods Explanation;Handout;Demonstration;Verbal cues   Comprehension Verbalized understanding;Returned demonstration             PT Long Term Goals - 06/24/16 1140      PT LONG TERM GOAL #1   Title Patient will reduce falls risk as indicated by Activities Specific Balance Confidence Scale (ABC) >67% by 08/08/2016.   Baseline scored 33% on 06/13/2016.   Time 8   Period Weeks   Status  On-going     PT LONG TERM GOAL #2   Title Patient will be able to perform home program independently for self-management.   Time 8   Period Weeks   Status On-going     PT LONG TERM GOAL #3   Title Patient will improve self-selected gait speed to 0.7 m/s or greater on 10 m walk to indicate reduced falls risk, improved community mobility and independence with ADL's.   Baseline average gait speed on 06/13/2016 was 0.59 m/sec   Time 8   Period Weeks   Status On-going     PT LONG TERM GOAL #4   Title Patient will score less than 14 seconds on the TUG test to indicate reduced falls risk.    Baseline scored 19 sec on 06/13/2016   Time 8   Period Weeks   Status On-going     PT LONG TERM GOAL #5  Title Patient will reduce falls risk as indicated by Merrilee Jansky Balance Scale Score of 49/56 or greater.   Baseline scored 42/56 on 06/13/2016   Time 8   Period Weeks   Status On-going     Additional Long Term Goals   Additional Long Term Goals Yes     PT LONG TERM GOAL #6   Title Patient will have improved score of 9 or more points on teh lower extremilty functional scale in order to improve patients ability to perform ADLs.   Baseline scored 26/80 on 06/16/2016   Time 8   Period Weeks   Status New               Plan - 06/24/16 [id number]    Clinical Impression Statement Patient demonstrating improved technique with home exercises this date requiring decreased verbal and tactile cuing for positioning and technique. Patient challenged by cone tapping exercise but improved with practice requiring decreased UE support with practice. Patient continues to be at risk for falls and would benefit from continued PT services.    Rehab Potential Fair   Clinical Impairments Affecting Rehab Potential Positive indicators: motivated, family support     Negative indicators: prior CVA with residual deficits primarily affecting right LE   PT Frequency 2x / week   PT Duration 8 weeks   PT Treatment/Interventions  Balance training;Therapeutic exercise;Therapeutic activities;Gait training;Stair training;Neuromuscular re-education;Patient/family education;Functional mobility training   PT Next Visit Plan continue hip flexor, abduction and adductor strengthening bilaterally, left knee quad and ham strengthening, sit to stand, standing mini squats, firm and Airex FT and ST progressions   PT Home Exercise Plan sidelying clam shells, bridging, SLR, slow marching with faded UEs assist   Consulted and Agree with Plan of Care Patient;Family member/caregiver   Family Member Consulted husband- Yari Moeder      Patient will benefit from skilled therapeutic intervention in order to improve the following deficits and impairments:  Decreased balance, Difficulty walking, Decreased strength, Decreased range of motion, Decreased mobility, Pain  Visit Diagnosis: Difficulty in walking, not elsewhere classified  Repeated falls     Problem List Patient Active Problem List   Diagnosis Date Noted  . Influenza A 05/27/2016  . Acne rosacea 01/15/2016  . Colonic diverticular disease 01/15/2016  . Obesity 01/15/2016  . S/P appendectomy   . Acne erythematosa 10/18/2015  . Adiposity 10/18/2015  . Allergic rhinitis 10/18/2015  . Cerebrovascular accident (CVA) ([place]) 10/18/2015  . Compression fracture of thoracic vertebra ([place]) 10/18/2015  . Diverticulosis of large intestine without perforation or abscess without bleeding 10/18/2015  . Esophagitis 10/18/2015  . Fibromyalgia 10/18/2015  . Candidiasis of breast 04/28/2015  . Candida glabrata infection 04/28/2015  . Long-term use of high-risk medication 04/13/2014  . History of high risk medication treatment 04/13/2014  . Lumbar compression fracture ([place]) 02/13/2014  . Compression fracture of L5 lumbar vertebra ([place]) 01/25/2014  . Compression fracture of lumbar vertebra ([place]) 01/25/2014  . Medicare annual wellness visit, subsequent 09/28/2012  . Encounter for general  adult medical examination without abnormal findings 09/28/2012  . History of thyroid nodule 06/13/2012  . Benign colonic polyp 06/13/2012  . History of thyroid disease 06/13/2012  . OSA on CPAP 06/06/2012  . Cerebrovascular disease 06/04/2012  . Hypertension 06/04/2012  . Hyperlipidemia 06/04/2012  . Osteoporosis 06/04/2012   Lady Deutscher PT, DPT Lady Deutscher 06/24/2016, 11:44 AM  [place] MAIN [facility] SERVICES [address] [place], [place], [address] Phone: [phone]   Fax:  [phone]  Name: Ellen Hamilton MRN: [id number] Date of Birth: 1950-12-15

## 2016-06-27 ENCOUNTER — Encounter: Payer: Self-pay | Admitting: Physical Therapy

## 2016-06-27 ENCOUNTER — Ambulatory Visit: Payer: Medicare Other | Admitting: Physical Therapy

## 2016-06-27 DIAGNOSIS — R296 Repeated falls: Secondary | ICD-10-CM | POA: Diagnosis not present

## 2016-06-27 DIAGNOSIS — R262 Difficulty in walking, not elsewhere classified: Secondary | ICD-10-CM

## 2016-06-27 NOTE — Therapy (Signed)
[place] MAIN [facility] SERVICES [address] [place], [place], [address] Phone: [phone]   Fax:  [phone]  Physical Therapy Treatment  Patient Details  Name: Lecretia Semler MRN: [id number] Date of Birth: 10/30/1950 Referring Provider: recurrent falls  Encounter Date: 06/27/2016      PT End of Session - 06/27/16 1310    Visit Number 6   Number of Visits 17   Date for PT Re-Evaluation 08/08/16   Authorization Type Needs G codes 5/10   PT Start Time 0104   PT Stop Time 0147   PT Time Calculation (min) 43 min   Equipment Utilized During Treatment Gait belt   Activity Tolerance Patient tolerated treatment well   Behavior During Therapy WFL for tasks assessed/performed      Past Medical History:  Diagnosis Date  . Compression fracture of L5 lumbar vertebra ([place]) 01/2014  . Fibromyalgia   . GERD (gastroesophageal reflux disease)   . History of transient ischemic attack (TIA) [id number]  . Hyperlipidemia   . Hypertension   . Osteoporosis   . Sleep apnea 2009   sleep study done in [place]  . Stroke [facility]) [id number]   pt has had two    Past Surgical History:  Procedure Laterality Date  . BACK SURGERY    . BUNIONECTOMY Bilateral [id number]  . CATARACT EXTRACTION W/ INTRAOCULAR LENS  IMPLANT, BILATERAL  2010  . EYE SURGERY    . kyphoplaasty  2010  . KYPHOPLASTY N/A 02/13/2014   Procedure: KYPHOPLASTY LUMBAR FIVE;  Surgeon: Charlie Pitter, MD;  Location: [place] NEURO ORS;  Service: Neurosurgery;  Laterality: N/A;  . LAPAROSCOPIC APPENDECTOMY N/A 01/11/2016   Procedure: APPENDECTOMY LAPAROSCOPIC;  Surgeon: Florene Glen, MD;  Location: [facility] ORS;  Service: General;  Laterality: N/A;    There were no vitals filed for this visit.      Subjective Assessment - 06/27/16 1305    Subjective Patient states her legs and low back feel a little sore today. Patient states the exercises have been going well at home and states she is doing two  sets of 10 reps.    Patient is accompained by: Family member   Pertinent History Patient states she had her appendix out in September 2017. Patient and her husband state that patient has had a decline in her mobility and balance since the surgery. Patient reports she has had 3-4 falls in the past six months and several near miss falls. Patient states she had a fall in the kitchen when she turned to reach for something. Patient states she cannot do quick turns. Patient states she is afraid of falling and states she feels she does not have the same stability as she did before. Patient feels her gait has deteriorted and is using her cane all the time. They report that the patient is not picking up her feet and catches her left toe instead of heel strike first. Patient exercises daily including going to the pool 5 days a week, doing the Nustep machine for 15-20 minutes daily, treadmill walking 7 minutes and sled. In addition, patient does Theraband exercises and works on high stepping and ladder work. Patient does about 15-20 minutes of these exercises. Patient states she had a kyphoplasty due to a compression fracture from straining using the restroom in August 2015 and the surgery was October 2015. Patient has a history of multiple compression fractures involving T8, T12 and L5 per patient and husband report. Patient has a history  of osteoporosis and patient suffered from TIA in 2008 and stroke in 2009 affecting primarily her right leg. Patient states she had a kyphoplasty due to a compression fracture from straining using the restroom in August 2015 and the surgery was October 2015. Patient has a history of multiple compression fractures involving T8, T12 and L5 per patient and husband report. Patient has a history of osteoporosis and patient suffered from TIA in 2008 and stroke in 2009 affecting primarily her right leg.     Patient Stated Goals patient states she would like to be able to walk better, be able to  turn around better and to have improved balance   Currently in Pain? Yes   Pain Location Leg   Pain Orientation Right;Left   Pain Descriptors / Indicators Sore   Pain Type Acute pain   Pain Onset Today      Neuromuscular Re-education:  Ball circles: Worked on standing with one foot on ball while doing clockwise rolls multiple reps each foot with faded UEs support. Performed multiple sets of this exercise. Patient progressed from bilateral UEs support to no UEs support except would need to reach out to touch intermittently to regain balance or would brace arm against // bar for support intermittently.   Cone tapping: On firm surface, patient performed alternate cone tapping with foot in series of one and two cones with CGA.  Patient with intermittent touching of outside of arm against // bar for support at times and patient having difficulty demonstrating good control at times.   Attempted standing on Airex pad while doing alternating foot tapping on soccer ball but this was too difficult for patient at this time.  Slow Marching: Patient performed on firm surface slow marching with eyes open with 5 second holds about 10 reps each leg with few finger support for balance intermittently.   Mini-Squats: Patient performed on firm surface 2 sets of 10 reps mini-squats without UEs support with verbal cues to not allow legs to adduct and brace thighs against each other. Patient reports fatigue and soreness  in gluteal muscles with this exercise.   Therapeutic Exercise:  Thera-band exercises: In sitting, patient performed green Thera-band 2 sets of 10 reps hip ABD and hip flexion In sitting, patient performed red Thera-band 2 sets of 10 reps hip ADD       PT Education - 06/27/16 1400    Education provided Yes   Education Details Discussed progressing to 5 second holds with Theraband exercises.   Person(s) Educated Patient   Methods Explanation;Demonstration   Comprehension Verbalized  understanding;Returned demonstration             PT Long Term Goals - 06/24/16 1140      PT LONG TERM GOAL #1   Title Patient will reduce falls risk as indicated by Activities Specific Balance Confidence Scale (ABC) >67% by 08/08/2016.   Baseline scored 33% on 06/13/2016.   Time 8   Period Weeks   Status On-going     PT LONG TERM GOAL #2   Title Patient will be able to perform home program independently for self-management.   Time 8   Period Weeks   Status On-going     PT LONG TERM GOAL #3   Title Patient will improve self-selected gait speed to 0.7 m/s or greater on 10 m walk to indicate reduced falls risk, improved community mobility and independence with ADL's.   Baseline average gait speed on 06/13/2016 was 0.59 m/sec   Time 8  Period Weeks   Status On-going     PT LONG TERM GOAL #4   Title Patient will score less than 14 seconds on the TUG test to indicate reduced falls risk.    Baseline scored 19 sec on 06/13/2016   Time 8   Period Weeks   Status On-going     PT LONG TERM GOAL #5   Title Patient will reduce falls risk as indicated by Merrilee Jansky Balance Scale Score of 49/56 or greater.   Baseline scored 42/56 on 06/13/2016   Time 8   Period Weeks   Status On-going     Additional Long Term Goals   Additional Long Term Goals Yes     PT LONG TERM GOAL #6   Title Patient will have improved score of 9 or more points on teh lower extremilty functional scale in order to improve patients ability to perform ADLs.   Baseline scored 26/80 on 06/16/2016   Time 8   Period Weeks   Status New               Plan - 06/27/16 1311    Clinical Impression Statement Patient continues to be challenged by standing balance activities especially ones that involve single leg stance time or shifting weight. Patient sore this date which likely impacted her ability to perform standingn balance exercises. Patient motivated to session and reports good compliance with home exercise program.  Patient has difficult time with right hip flexion. Encouraged patient to follow-up as indicated to continue to address functional deficits and goals as set on plan of care.    Rehab Potential Fair   Clinical Impairments Affecting Rehab Potential Positive indicators: motivated, family support     Negative indicators: prior CVA with residual deficits primarily affecting right LE   PT Frequency 2x / week   PT Duration 8 weeks   PT Treatment/Interventions Balance training;Therapeutic exercise;Therapeutic activities;Gait training;Stair training;Neuromuscular re-education;Patient/family education;Functional mobility training   PT Next Visit Plan continue hip flexor, abduction and adductor strengthening bilaterally, left knee quad and ham strengthening, sit to stand, standing mini squats with lateral side stepping, firm and Airex FT and ST progressions; alternate cone tapping   PT Home Exercise Plan sidelying clam shells, bridging, SLR, slow marching with faded UEs assist, reverse clam shells with theraband resistance, seated theraband resisted hip abd,add and flexion.    Consulted and Agree with Plan of Care Patient;Family member/caregiver   Family Member Consulted husband- Wynetta Teichman      Patient will benefit from skilled therapeutic intervention in order to improve the following deficits and impairments:  Decreased balance, Difficulty walking, Decreased strength, Decreased range of motion, Decreased mobility, Pain  Visit Diagnosis: Difficulty in walking, not elsewhere classified  Repeated falls     Problem List Patient Active Problem List   Diagnosis Date Noted  . Influenza A 05/27/2016  . Acne rosacea 01/15/2016  . Colonic diverticular disease 01/15/2016  . Obesity 01/15/2016  . S/P appendectomy   . Acne erythematosa 10/18/2015  . Adiposity 10/18/2015  . Allergic rhinitis 10/18/2015  . Cerebrovascular accident (CVA) ([place]) 10/18/2015  . Compression fracture of thoracic vertebra ([place])  10/18/2015  . Diverticulosis of large intestine without perforation or abscess without bleeding 10/18/2015  . Esophagitis 10/18/2015  . Fibromyalgia 10/18/2015  . Candidiasis of breast 04/28/2015  . Candida glabrata infection 04/28/2015  . Long-term use of high-risk medication 04/13/2014  . History of high risk medication treatment 04/13/2014  . Lumbar compression fracture ([place]) 02/13/2014  . Compression  fracture of L5 lumbar vertebra ([place]) 01/25/2014  . Compression fracture of lumbar vertebra ([place]) 01/25/2014  . Medicare annual wellness visit, subsequent 09/28/2012  . Encounter for general adult medical examination without abnormal findings 09/28/2012  . History of thyroid nodule 06/13/2012  . Benign colonic polyp 06/13/2012  . History of thyroid disease 06/13/2012  . OSA on CPAP 06/06/2012  . Cerebrovascular disease 06/04/2012  . Hypertension 06/04/2012  . Hyperlipidemia 06/04/2012  . Osteoporosis 06/04/2012   Lady Deutscher PT, DPT Lady Deutscher 06/27/2016, 2:28 PM  [place] MAIN [facility] SERVICES [address] [place], [place], [address] Phone: [phone]   Fax:  [phone]  Name: Lysha Scher MRN: [id number] Date of Birth: [birth year]

## 2016-07-01 ENCOUNTER — Ambulatory Visit: Payer: Medicare Other | Admitting: Physical Therapy

## 2016-07-01 ENCOUNTER — Encounter: Payer: Self-pay | Admitting: Physical Therapy

## 2016-07-01 DIAGNOSIS — R296 Repeated falls: Secondary | ICD-10-CM

## 2016-07-01 DIAGNOSIS — R262 Difficulty in walking, not elsewhere classified: Secondary | ICD-10-CM

## 2016-07-01 NOTE — Therapy (Signed)
[place] MAIN [facility] SERVICES [address] [place], [place], [address] Phone: [phone]   Fax:  [phone]  Physical Therapy Treatment  Patient Details  Name: Ellen Hamilton MRN: [id number] Date of Birth: 1951-04-24 Referring Provider: recurrent falls  Encounter Date: 07/01/2016      PT End of Session - 07/01/16 1035    Visit Number 7   Number of Visits 17   Date for PT Re-Evaluation 08/08/16   Authorization Type Needs G codes 5/10   PT Start Time 1020   PT Stop Time 1100   PT Time Calculation (min) 40 min   Equipment Utilized During Treatment --   Activity Tolerance Patient tolerated treatment well   Behavior During Therapy [facility] for tasks assessed/performed      Past Medical History:  Diagnosis Date  . Compression fracture of L5 lumbar vertebra ([place]) 01/2014  . Fibromyalgia   . GERD (gastroesophageal reflux disease)   . History of transient ischemic attack (TIA) [id number]  . Hyperlipidemia   . Hypertension   . Osteoporosis   . Sleep apnea 2009   sleep study done in [place]  . Stroke [facility]) [phone]   pt has had two    Past Surgical History:  Procedure Laterality Date  . BACK SURGERY    . BUNIONECTOMY Bilateral [id number]  . CATARACT EXTRACTION W/ INTRAOCULAR LENS  IMPLANT, BILATERAL  2010  . EYE SURGERY    . kyphoplaasty  2010  . KYPHOPLASTY N/A 02/13/2014   Procedure: KYPHOPLASTY LUMBAR FIVE;  Surgeon: Charlie Pitter, MD;  Location: [place] NEURO ORS;  Service: Neurosurgery;  Laterality: N/A;  . LAPAROSCOPIC APPENDECTOMY N/A 01/11/2016   Procedure: APPENDECTOMY LAPAROSCOPIC;  Surgeon: Florene Glen, MD;  Location: [facility] ORS;  Service: General;  Laterality: N/A;    There were no vitals filed for this visit.      Subjective Assessment - 07/01/16 1029    Subjective Patient states she is alternating doing the exercises on the mat table and the exercises with the Theraband at home. Patient reports that her legs continue  to fatigue quickly but she is not as sore as she was initially.    Patient is accompained by: Family member   Pertinent History Patient states she had her appendix out in September 2017. Patient and her husband state that patient has had a decline in her mobility and balance since the surgery. Patient reports she has had 3-4 falls in the past six months and several near miss falls. Patient states she had a fall in the kitchen when she turned to reach for something. Patient states she cannot do quick turns. Patient states she is afraid of falling and states she feels she does not have the same stability as she did before. Patient feels her gait has deteriorted and is using her cane all the time. They report that the patient is not picking up her feet and catches her left toe instead of heel strike first. Patient exercises daily including going to the pool 5 days a week, doing the Nustep machine for 15-20 minutes daily, treadmill walking 7 minutes and sled. In addition, patient does Theraband exercises and works on high stepping and ladder work. Patient does about 15-20 minutes of these exercises. Patient states she had a kyphoplasty due to a compression fracture from straining using the restroom in August 2015 and the surgery was October 2015. Patient has a history of multiple compression fractures involving T8, T12 and L5 per patient and  husband report. Patient has a history of osteoporosis and patient suffered from TIA in 2008 and stroke in 2009 affecting primarily her right leg. Patient states she had a kyphoplasty due to a compression fracture from straining using the restroom in August 2015 and the surgery was October 2015. Patient has a history of multiple compression fractures involving T8, T12 and L5 per patient and husband report. Patient has a history of osteoporosis and patient suffered from TIA in 2008 and stroke in 2009 affecting primarily her right leg.     Patient Stated Goals patient states she  would like to be able to walk better, be able to turn around better and to have improved balance   Currently in Pain? No/denies   Pain Onset --      Therapeutic Exercise: Patient performed seated in wheeled chair 3 laps about (150' each) of forward propulsion of self in chair working on hamstring strengthening. Patient reports fatigue in quads.  Patient performed 2 sets of 12 reps seated hip flexion with 2# on left leg and 1# on right leg Patient performed in supine 2 sets of 10 reps without weights SLR. Patient performed in sidelying without weights 2 sets of 10 reps hip ADDuction and 2 sets of 10 reps with 1# weight on left leg and no weight on right leg hip ABDuction. Patient required a few short rest breaks secondary to fatigue during session.        PT Education - 07/01/16 1030    Education provided Yes   Education Details Reviewed plan of care   Person(s) Educated Patient;Spouse   Methods Explanation   Comprehension Verbalized understanding             PT Long Term Goals - 06/24/16 1140      PT LONG TERM GOAL #1   Title Patient will reduce falls risk as indicated by Activities Specific Balance Confidence Scale (ABC) >67% by 08/08/2016.   Baseline scored 33% on 06/13/2016.   Time 8   Period Weeks   Status On-going     PT LONG TERM GOAL #2   Title Patient will be able to perform home program independently for self-management.   Time 8   Period Weeks   Status On-going     PT LONG TERM GOAL #3   Title Patient will improve self-selected gait speed to 0.7 m/s or greater on 10 m walk to indicate reduced falls risk, improved community mobility and independence with ADL's.   Baseline average gait speed on 06/13/2016 was 0.59 m/sec   Time 8   Period Weeks   Status On-going     PT LONG TERM GOAL #4   Title Patient will score less than 14 seconds on the TUG test to indicate reduced falls risk.    Baseline scored 19 sec on 06/13/2016   Time 8   Period Weeks   Status  On-going     PT LONG TERM GOAL #5   Title Patient will reduce falls risk as indicated by Merrilee Jansky Balance Scale Score of 49/56 or greater.   Baseline scored 42/56 on 06/13/2016   Time 8   Period Weeks   Status On-going     Additional Long Term Goals   Additional Long Term Goals Yes     PT LONG TERM GOAL #6   Title Patient will have improved score of 9 or more points on teh lower extremilty functional scale in order to improve patients ability to perform ADLs.   Baseline scored  26/80 on 06/16/2016   Time 8   Period Weeks   Status New               Plan - 07/01/16 1036    Clinical Impression Statement Patient with bilateral hip muscle strength weakness but more pronounced on right side as compared to the left side. Patient gradually building strength in lower extremities as she is now better able to perform exercises without manual and verbal cuing to prevent muscle substitution patterns and able to increase hold times to 5 seconds. Patient would benefit from continued PT services to further address functional deficits, goals and to try to reduce patient's falls risk.    Rehab Potential Fair   Clinical Impairments Affecting Rehab Potential Positive indicators: motivated, family support     Negative indicators: prior CVA with residual deficits primarily affecting right LE   PT Frequency 2x / week   PT Duration 8 weeks   PT Treatment/Interventions Balance training;Therapeutic exercise;Therapeutic activities;Gait training;Stair training;Neuromuscular re-education;Patient/family education;Functional mobility training   PT Next Visit Plan continue hip flexor, abduction and adductor strengthening bilaterally, left knee quad and ham strengthening, sit to stand, standing mini squats with lateral side stepping, firm and Airex FT and ST progressions; alternate cone tapping   PT Home Exercise Plan sidelying clam shells, bridging, SLR, slow marching with faded UEs assist, reverse clam shells with  theraband resistance, seated theraband resisted hip abd,add and flexion.    Consulted and Agree with Plan of Care Patient;Family member/caregiver   Family Member Consulted husband- Saavi Lawton      Patient will benefit from skilled therapeutic intervention in order to improve the following deficits and impairments:  Decreased balance, Difficulty walking, Decreased strength, Decreased range of motion, Decreased mobility, Pain  Visit Diagnosis: Difficulty in walking, not elsewhere classified  Repeated falls     Problem List Patient Active Problem List   Diagnosis Date Noted  . Influenza A 05/27/2016  . Acne rosacea 01/15/2016  . Colonic diverticular disease 01/15/2016  . Obesity 01/15/2016  . S/P appendectomy   . Acne erythematosa 10/18/2015  . Adiposity 10/18/2015  . Allergic rhinitis 10/18/2015  . Cerebrovascular accident (CVA) ([place]) 10/18/2015  . Compression fracture of thoracic vertebra ([place]) 10/18/2015  . Diverticulosis of large intestine without perforation or abscess without bleeding 10/18/2015  . Esophagitis 10/18/2015  . Fibromyalgia 10/18/2015  . Candidiasis of breast 04/28/2015  . Candida glabrata infection 04/28/2015  . Long-term use of high-risk medication 04/13/2014  . History of high risk medication treatment 04/13/2014  . Lumbar compression fracture ([place]) 02/13/2014  . Compression fracture of L5 lumbar vertebra ([place]) 01/25/2014  . Compression fracture of lumbar vertebra ([place]) 01/25/2014  . Medicare annual wellness visit, subsequent 09/28/2012  . Encounter for general adult medical examination without abnormal findings 09/28/2012  . History of thyroid nodule 06/13/2012  . Benign colonic polyp 06/13/2012  . History of thyroid disease 06/13/2012  . OSA on CPAP 06/06/2012  . Cerebrovascular disease 06/04/2012  . Hypertension 06/04/2012  . Hyperlipidemia 06/04/2012  . Osteoporosis 06/04/2012   Lady Deutscher PT, DPT Lady Deutscher 07/01/2016, 11:27  AM  [place] MAIN [facility] SERVICES [address] [place], [place], [address] Phone: [phone]   Fax:  [phone]  Name: Ellen Hamilton MRN: [id number] Date of Birth: 1950-12-22

## 2016-07-02 ENCOUNTER — Encounter: Payer: Self-pay | Admitting: Internal Medicine

## 2016-07-04 ENCOUNTER — Ambulatory Visit: Payer: Medicare Other | Admitting: Physical Therapy

## 2016-07-04 ENCOUNTER — Encounter: Payer: Self-pay | Admitting: Physical Therapy

## 2016-07-04 DIAGNOSIS — R296 Repeated falls: Secondary | ICD-10-CM

## 2016-07-04 DIAGNOSIS — R262 Difficulty in walking, not elsewhere classified: Secondary | ICD-10-CM

## 2016-07-04 MED ORDER — CLOPIDOGREL BISULFATE 75 MG PO TABS: 75.0000 mg | ORAL_TABLET | Freq: Every day | ORAL | 2 refills | Status: AC

## 2016-07-04 NOTE — Therapy (Signed)
[place] MAIN [facility] SERVICES [address] [place], [place], [address] Phone: [phone]   Fax:  [phone]  Physical Therapy Treatment  Patient Details  Name: Ellen Hamilton MRN: [id number] Date of Birth: 1950-06-19 Referring Provider: recurrent falls  Encounter Date: 07/04/2016      PT End of Session - 07/04/16 1310    Visit Number 8   Number of Visits 17   Date for PT Re-Evaluation 08/08/16   Authorization Type Needs G codes 8/10   PT Start Time 0101   PT Stop Time 0158   PT Time Calculation (min) 57 min   Equipment Utilized During Treatment Gait belt   Activity Tolerance Patient tolerated treatment well   Behavior During Therapy WFL for tasks assessed/performed      Past Medical History:  Diagnosis Date  . Compression fracture of L5 lumbar vertebra ([place]) 01/2014  . Fibromyalgia   . GERD (gastroesophageal reflux disease)   . History of transient ischemic attack (TIA) [id number]  . Hyperlipidemia   . Hypertension   . Osteoporosis   . Sleep apnea 2007/08/28   sleep study done in   . Stroke [facility]) [phone]   pt has had two    Past Surgical History:  Procedure Laterality Date  . BACK SURGERY    . BUNIONECTOMY Bilateral [id number]  . CATARACT EXTRACTION W/ INTRAOCULAR LENS  IMPLANT, BILATERAL  08-27-08  . EYE SURGERY    . kyphoplaasty  2008/08/27  . KYPHOPLASTY N/A 02/13/2014   Procedure: KYPHOPLASTY LUMBAR FIVE;  Surgeon: Charlie Pitter, MD;  Location: [place] NEURO ORS;  Service: Neurosurgery;  Laterality: N/A;  . LAPAROSCOPIC APPENDECTOMY N/A 01/11/2016   Procedure: APPENDECTOMY LAPAROSCOPIC;  Surgeon: Florene Glen, MD;  Location: [facility] ORS;  Service: General;  Laterality: N/A;    There were no vitals filed for this visit.      Subjective Assessment - 07/04/16 1308    Subjective Patient states her legs felt good this morning and states she definitely feels a change in her legs.    Patient is accompained by: Family member   Pertinent History Patient states she had her appendix out in September 2017. Patient and her husband state that patient has had a decline in her mobility and balance since the surgery. Patient reports she has had 3-4 falls in the past six months and several near miss falls. Patient states she had a fall in the kitchen when she turned to reach for something. Patient states she cannot do quick turns. Patient states she is afraid of falling and states she feels she does not have the same stability as she did before. Patient feels her gait has deteriorted and is using her cane all the time. They report that the patient is not picking up her feet and catches her left toe instead of heel strike first. Patient exercises daily including going to the pool 5 days a week, doing the Nustep machine for 15-20 minutes daily, treadmill walking 7 minutes and sled. In addition, patient does Theraband exercises and works on high stepping and ladder work. Patient does about 15-20 minutes of these exercises. Patient states she had a kyphoplasty due to a compression fracture from straining using the restroom in August 2015 and the surgery was October 2015. Patient has a history of multiple compression fractures involving T8, T12 and L5 per patient and husband report. Patient has a history of osteoporosis and patient suffered from TIA in 08-28-2006 and stroke in Aug 28, 2007 affecting primarily  her right leg. Patient states she had a kyphoplasty due to a compression fracture from straining using the restroom in August 2015 and the surgery was October 2015. Patient has a history of multiple compression fractures involving T8, T12 and L5 per patient and husband report. Patient has a history of osteoporosis and patient suffered from TIA in 2008 and stroke in 2009 affecting primarily her right leg.     Patient Stated Goals patient states she would like to be able to walk better, be able to turn around better and to have improved balance   Currently in  Pain? No/denies      Neuromuscular Re-education:  Patient performed alternating mini-squats with sidestepping the length of // bars and back multiple reps without UE support. However, patient did need to reach several times to touch bars for support to regain her balance.  Patient performed 3 way step taps to balance pods straight forward, lateral and posterolaterally with left leg 3 sets and then right leg 3 sets. Then performed step taps to balance pod targets as called out by therapist.   Performed single leg standing box balance exercise with few finger hold of one hand for balance. Patient able to hold for about 10-30 seconds. Patient has increased difficulty performing SLS on right leg as compared to left.  Performed tandem walking in // bars multiple reps. Patient improved with practice with this activity as initially she was having a difficulty bringing her left foot to be centered on the line as she was bringing it out too far laterally to the left.          PT Education - 07/04/16 1309    Education provided Yes   Education Details Reinforced HEP   Person(s) Educated Patient;Spouse   Methods Explanation   Comprehension Verbalized understanding;Returned demonstration             PT Long Term Goals - 06/24/16 1140      PT LONG TERM GOAL #1   Title Patient will reduce falls risk as indicated by Activities Specific Balance Confidence Scale (ABC) >67% by 08/08/2016.   Baseline scored 33% on 06/13/2016.   Time 8   Period Weeks   Status On-going     PT LONG TERM GOAL #2   Title Patient will be able to perform home program independently for self-management.   Time 8   Period Weeks   Status On-going     PT LONG TERM GOAL #3   Title Patient will improve self-selected gait speed to 0.7 m/s or greater on 10 m walk to indicate reduced falls risk, improved community mobility and independence with ADL's.   Baseline average gait speed on 06/13/2016 was 0.59 m/sec   Time 8    Period Weeks   Status On-going     PT LONG TERM GOAL #4   Title Patient will score less than 14 seconds on the TUG test to indicate reduced falls risk.    Baseline scored 19 sec on 06/13/2016   Time 8   Period Weeks   Status On-going     PT LONG TERM GOAL #5   Title Patient will reduce falls risk as indicated by Merrilee Jansky Balance Scale Score of 49/56 or greater.   Baseline scored 42/56 on 06/13/2016   Time 8   Period Weeks   Status On-going     Additional Long Term Goals   Additional Long Term Goals Yes     PT LONG TERM GOAL #6   Title  Patient will have improved score of 9 or more points on teh lower extremilty functional scale in order to improve patients ability to perform ADLs.   Baseline scored 26/80 on 06/16/2016   Time 8   Period Weeks   Status New               Plan - 07/04/16 1310    Clinical Impression Statement Patient reporting that she is feeling improvements in her legs and that she is better able to tolerate the exercises. Patient continues to have difficulty with SLS activties and with the ability to shift her weight from one leg to another as it takes her increased time to get her balance and recover from quick transitional movements. Patient improves with practice activities this date in the clinic. Recommend continued PT services to further address deficits and address goals.    Rehab Potential Fair   Clinical Impairments Affecting Rehab Potential Positive indicators: motivated, family support     Negative indicators: prior CVA with residual deficits primarily affecting right LE   PT Frequency 2x / week   PT Duration 8 weeks   PT Treatment/Interventions Balance training;Therapeutic exercise;Therapeutic activities;Gait training;Stair training;Neuromuscular re-education;Patient/family education;Functional mobility training   PT Next Visit Plan continue hip flexor, abduction and adductor strengthening bilaterally, left knee quad and ham strengthening, standing mini  squats with lateral side stepping, firm and Airex FT and ST progressions; Try mini squats on Airex pad and alternating forward and sideways lunges   PT Home Exercise Plan sidelying clam shells, bridging, SLR, slow marching with faded UEs assist, reverse clam shells with theraband resistance, seated theraband resisted hip abd,add and flexion.    Consulted and Agree with Plan of Care Patient;Family member/caregiver   Family Member Consulted husband- Reya Schweder      Patient will benefit from skilled therapeutic intervention in order to improve the following deficits and impairments:  Decreased balance, Difficulty walking, Decreased strength, Decreased range of motion, Decreased mobility, Pain  Visit Diagnosis: Difficulty in walking, not elsewhere classified  Repeated falls     Problem List Patient Active Problem List   Diagnosis Date Noted  . Influenza A 05/27/2016  . Acne rosacea 01/15/2016  . Colonic diverticular disease 01/15/2016  . Obesity 01/15/2016  . S/P appendectomy   . Acne erythematosa 10/18/2015  . Adiposity 10/18/2015  . Allergic rhinitis 10/18/2015  . Cerebrovascular accident (CVA) ([place]) 10/18/2015  . Compression fracture of thoracic vertebra ([place]) 10/18/2015  . Diverticulosis of large intestine without perforation or abscess without bleeding 10/18/2015  . Esophagitis 10/18/2015  . Fibromyalgia 10/18/2015  . Candidiasis of breast 04/28/2015  . Candida glabrata infection 04/28/2015  . Long-term use of high-risk medication 04/13/2014  . History of high risk medication treatment 04/13/2014  . Lumbar compression fracture ([place]) 02/13/2014  . Compression fracture of L5 lumbar vertebra ([place]) 01/25/2014  . Compression fracture of lumbar vertebra ([place]) 01/25/2014  . Medicare annual wellness visit, subsequent 09/28/2012  . Encounter for general adult medical examination without abnormal findings 09/28/2012  . History of thyroid nodule 06/13/2012  . Benign colonic polyp  06/13/2012  . History of thyroid disease 06/13/2012  . OSA on CPAP 06/06/2012  . Cerebrovascular disease 06/04/2012  . Hypertension 06/04/2012  . Hyperlipidemia 06/04/2012  . Osteoporosis 06/04/2012   Lady Deutscher PT, DPT Lady Deutscher 07/04/2016, 2:16 PM  [place] MAIN [facility] SERVICES [address] [place], [place], [address] Phone: [phone]   Fax:  [phone]  Name: Ellen Hamilton MRN:  [id number] Date of Birth: 1950-11-05

## 2016-07-04 NOTE — Telephone Encounter (Signed)
Patient requested that we take over plavix rx bc her neurologist did not complete the PA needed.  I have sent the rx to [facility].

## 2016-07-07 ENCOUNTER — Encounter: Payer: Self-pay | Admitting: *Deleted

## 2016-07-07 NOTE — Telephone Encounter (Signed)
PA for Plavix has been submitted. Determination should be made within 72 hours. Patient aware MY Chart message sent. FYI

## 2016-07-08 ENCOUNTER — Ambulatory Visit: Payer: Medicare Other | Admitting: Physical Therapy

## 2016-07-08 ENCOUNTER — Encounter: Payer: Self-pay | Admitting: Physical Therapy

## 2016-07-08 DIAGNOSIS — R262 Difficulty in walking, not elsewhere classified: Secondary | ICD-10-CM | POA: Diagnosis not present

## 2016-07-08 DIAGNOSIS — R296 Repeated falls: Secondary | ICD-10-CM

## 2016-07-08 NOTE — Therapy (Signed)
[place] MAIN [facility] SERVICES [address] [place], [place], [address] Phone: [phone]   Fax:  [phone]  Physical Therapy Treatment  Patient Details  Name: Ellen Hamilton MRN: [id number] Date of Birth: January 24, 1951 Referring Provider: recurrent falls  Encounter Date: 07/08/2016      PT End of Session - 07/08/16 1341    Visit Number 9   Number of Visits 17   Date for PT Re-Evaluation 08/08/16   Authorization Type Needs G codes 8/10   PT Start Time 0141   PT Stop Time 0228   PT Time Calculation (min) 47 min   Equipment Utilized During Treatment Gait belt   Activity Tolerance Patient tolerated treatment well   Behavior During Therapy WFL for tasks assessed/performed      Past Medical History:  Diagnosis Date  . Compression fracture of L5 lumbar vertebra ([place]) 01/2014  . Fibromyalgia   . GERD (gastroesophageal reflux disease)   . History of transient ischemic attack (TIA) [id number]  . Hyperlipidemia   . Hypertension   . Osteoporosis   . Sleep apnea August 24, 2007   sleep study done in [place]  . Stroke [facility]) [phone]   pt has had two    Past Surgical History:  Procedure Laterality Date  . BACK SURGERY    . BUNIONECTOMY Bilateral [id number]  . CATARACT EXTRACTION W/ INTRAOCULAR LENS  IMPLANT, BILATERAL  August 23, 2008  . EYE SURGERY    . kyphoplaasty  2008/08/23  . KYPHOPLASTY N/A 02/13/2014   Procedure: KYPHOPLASTY LUMBAR FIVE;  Surgeon: Charlie Pitter, MD;  Location: [facility];  Service: Neurosurgery;  Laterality: N/A;  . LAPAROSCOPIC APPENDECTOMY N/A 01/11/2016   Procedure: APPENDECTOMY LAPAROSCOPIC;  Surgeon: Florene Glen, MD;  Location: [facility] ORS;  Service: General;  Laterality: N/A;    There were no vitals filed for this visit.      Subjective Assessment - 07/08/16 1341    Subjective Patient states she did some mat work and treadmill and NuStep activities over the weekend.    Patient is accompained by: Family member   Pertinent  History Patient states she had her appendix out in September 2017. Patient and her husband state that patient has had a decline in her mobility and balance since the surgery. Patient reports she has had 3-4 falls in the past six months and several near miss falls. Patient states she had a fall in the kitchen when she turned to reach for something. Patient states she cannot do quick turns. Patient states she is afraid of falling and states she feels she does not have the same stability as she did before. Patient feels her gait has deteriorted and is using her cane all the time. They report that the patient is not picking up her feet and catches her left toe instead of heel strike first. Patient exercises daily including going to the pool 5 days a week, doing the Nustep machine for 15-20 minutes daily, treadmill walking 7 minutes and sled. In addition, patient does Theraband exercises and works on high stepping and ladder work. Patient does about 15-20 minutes of these exercises. Patient states she had a kyphoplasty due to a compression fracture from straining using the restroom in August 2015 and the surgery was October 2015. Patient has a history of multiple compression fractures involving T8, T12 and L5 per patient and husband report. Patient has a history of osteoporosis and patient suffered from TIA in Aug 24, 2006 and stroke in 08-24-2007 affecting primarily her right  leg. Patient states she had a kyphoplasty due to a compression fracture from straining using the restroom in August 2015 and the surgery was October 2015. Patient has a history of multiple compression fractures involving T8, T12 and L5 per patient and husband report. Patient has a history of osteoporosis and patient suffered from TIA in 2008 and stroke in 2009 affecting primarily her right leg.     Patient Stated Goals patient states she would like to be able to walk better, be able to turn around better and to have improved balance   Currently in Pain?  No/denies      Neuromuscular Re-education:  On firm surface and then on Airex pad, worked on eyes closed static holds with feet together progressions and semi-tandem stance with alternate lead leg. Patient leaning posterolaterally primarily to right and tends to put increased weight on right leg as compared to left leg but improved with practice. Patient reports fatigue in calf muscles with this activity. Patient required assistance ranging from [place] to [place].   Performed alternating feet side step lunges with toe touch holds for 3-5 seconds and then worked on [employer]. Patient able to hold SLS for 1-3 seconds and demonstrates increased difficulty with SLS on right leg.   Performed anterior-posterior step lunges with SLS holds for 3-5 seconds and then worked on [employer] progressions  Patient able to hold SLS for 1-3 seconds and demonstrates increased difficulty with SLS on right leg. Patient was unable to do posterior SLS progressions. Patient required assistance ranging from CGA to Min A and intermittent use of // bars for support due to LOB. Patient required verbal cues to maintain 30 degree bent knee position for technique and to perform movements in a slower, more controlled manner. Patient required seated rest breaks and reports fatigue in lower extremities with this activity.   Patient performed seated in wheeled chair 3 laps about (150' each) of forward propulsion of self in chair working on hamstring strengthening. Patient required one short rest break secondary to fatigue.        PT Education - 07/08/16 1341    Education provided Yes   Education Details Discussed technique with exercises; discussed balance    Person(s) Educated Patient;Spouse   Methods Explanation;Demonstration   Comprehension Verbalized understanding;Returned demonstration;Verbal cues required             PT Long Term Goals - 06/24/16 1140      PT LONG TERM GOAL #1   Title Patient will reduce falls risk  as indicated by Activities Specific Balance Confidence Scale (ABC) >67% by 08/08/2016.   Baseline scored 33% on 06/13/2016.   Time 8   Period Weeks   Status On-going     PT LONG TERM GOAL #2   Title Patient will be able to perform home program independently for self-management.   Time 8   Period Weeks   Status On-going     PT LONG TERM GOAL #3   Title Patient will improve self-selected gait speed to 0.7 m/s or greater on 10 m walk to indicate reduced falls risk, improved community mobility and independence with ADL's.   Baseline average gait speed on 06/13/2016 was 0.59 m/sec   Time 8   Period Weeks   Status On-going     PT LONG TERM GOAL #4   Title Patient will score less than 14 seconds on the TUG test to indicate reduced falls risk.    Baseline scored 19 sec on 06/13/2016  Time 8   Period Weeks   Status On-going     PT LONG TERM GOAL #5   Title Patient will reduce falls risk as indicated by Merrilee Jansky Balance Scale Score of 49/56 or greater.   Baseline scored 42/56 on 06/13/2016   Time 8   Period Weeks   Status On-going     Additional Long Term Goals   Additional Long Term Goals Yes     PT LONG TERM GOAL #6   Title Patient will have improved score of 9 or more points on teh lower extremilty functional scale in order to improve patients ability to perform ADLs.   Baseline scored 26/80 on 06/16/2016   Time 8   Period Weeks   Status New               Plan - 07/08/16 1341    Clinical Impression Statement Patient demosntrated difficulty with eyes closed activities demonstrating increased sway and losses of balance posterolaterally especially to the right. Patient would benefit from continued eyes closed activities on firm and uneven surfaces to work on improving vestibular and somatosensory strategies. Patient did improve with practice in the clinic this date and was able to perform longer static holds ranging from 15-30 seconds. Patient continues to have difficulty with SLS  activties and with weight shifting from left to/from right especially wit quicker transitional movements and would benefit from further PT services to continue to address balance and strength deficits and to try to reduce patient's falls risk.    Rehab Potential Fair   Clinical Impairments Affecting Rehab Potential Positive indicators: motivated, family support     Negative indicators: prior CVA with residual deficits primarily affecting right LE   PT Frequency 2x / week   PT Duration 8 weeks   PT Treatment/Interventions Balance training;Therapeutic exercise;Therapeutic activities;Gait training;Stair training;Neuromuscular re-education;Patient/family education;Functional mobility training   PT Next Visit Plan continue hip flexor, abduction and adductor strengthening bilaterally, LEs strengthening, standing mini squats with lateral side stepping, firm and Airex FT and ST progressions with EO/EC; mini-lunge and SLS activities; activities that involve transitional movement speed   PT Home Exercise Plan sidelying clam shells, bridging, SLR, slow marching with faded UEs assist, reverse clam shells with theraband resistance, seated theraband resisted hip abd,add and flexion.    Consulted and Agree with Plan of Care Patient;Family member/caregiver   Family Member Consulted husband- Shanyla Reiley      Patient will benefit from skilled therapeutic intervention in order to improve the following deficits and impairments:  Decreased balance, Difficulty walking, Decreased strength, Decreased range of motion, Decreased mobility, Pain  Visit Diagnosis: Difficulty in walking, not elsewhere classified  Repeated falls     Problem List Patient Active Problem List   Diagnosis Date Noted  . Influenza A 05/27/2016  . Acne rosacea 01/15/2016  . Colonic diverticular disease 01/15/2016  . Obesity 01/15/2016  . S/P appendectomy   . Acne erythematosa 10/18/2015  . Adiposity 10/18/2015  . Allergic rhinitis  10/18/2015  . Cerebrovascular accident (CVA) ([place]) 10/18/2015  . Compression fracture of thoracic vertebra ([place]) 10/18/2015  . Diverticulosis of large intestine without perforation or abscess without bleeding 10/18/2015  . Esophagitis 10/18/2015  . Fibromyalgia 10/18/2015  . Candidiasis of breast 04/28/2015  . Candida glabrata infection 04/28/2015  . Long-term use of high-risk medication 04/13/2014  . History of high risk medication treatment 04/13/2014  . Lumbar compression fracture ([place]) 02/13/2014  . Compression fracture of L5 lumbar vertebra ([place]) 01/25/2014  . Compression fracture  of lumbar vertebra ([place]) 01/25/2014  . Medicare annual wellness visit, subsequent 09/28/2012  . Encounter for general adult medical examination without abnormal findings 09/28/2012  . History of thyroid nodule 06/13/2012  . Benign colonic polyp 06/13/2012  . History of thyroid disease 06/13/2012  . OSA on CPAP 06/06/2012  . Cerebrovascular disease 06/04/2012  . Hypertension 06/04/2012  . Hyperlipidemia 06/04/2012  . Osteoporosis 06/04/2012   Lady Deutscher PT, DPT Lady Deutscher 07/09/2016, 10:35 AM  [place] MAIN [facility] SERVICES [address] [place], [place], [address] Phone: [phone]   Fax:  [phone]  Name: Ellen Hamilton MRN: [id number] Date of Birth: 03-26-1951

## 2016-07-11 ENCOUNTER — Ambulatory Visit: Payer: Medicare Other | Attending: Internal Medicine

## 2016-07-11 VITALS — BP 158/87 | HR 58

## 2016-07-11 DIAGNOSIS — R296 Repeated falls: Secondary | ICD-10-CM | POA: Diagnosis not present

## 2016-07-11 DIAGNOSIS — R262 Difficulty in walking, not elsewhere classified: Secondary | ICD-10-CM | POA: Diagnosis not present

## 2016-07-11 NOTE — Therapy (Signed)
[place] MAIN [facility] SERVICES [address] [place], [place], [address] Phone: [phone]   Fax:  [phone]  Physical Therapy Treatment  Patient Details  Name: Ellen Hamilton MRN: [id number] Date of Birth: 10-07-1950 Referring Provider: recurrent falls  Encounter Date: 07/11/2016      PT End of Session - 07/11/16 0944    Visit Number 10   Number of Visits 17   Date for PT Re-Evaluation 08/08/16   Authorization Type Needs G codes 02-16-2023   PT Start Time 0902   PT Stop Time 0940   PT Time Calculation (min) 38 min   Equipment Utilized During Treatment Gait belt   Activity Tolerance Patient tolerated treatment well   Behavior During Therapy [facility] for tasks assessed/performed      Past Medical History:  Diagnosis Date  . Compression fracture of L5 lumbar vertebra ([place]) 01/2014  . Fibromyalgia   . GERD (gastroesophageal reflux disease)   . History of transient ischemic attack (TIA) [id number]  . Hyperlipidemia   . Hypertension   . Osteoporosis   . Sleep apnea 09/06/07   sleep study done in [place]  . Stroke [facility]) [phone]   pt has had two    Past Surgical History:  Procedure Laterality Date  . BACK SURGERY    . BUNIONECTOMY Bilateral [id number]  . CATARACT EXTRACTION W/ INTRAOCULAR LENS  IMPLANT, BILATERAL  09/05/08  . EYE SURGERY    . kyphoplaasty  09-05-2008  . KYPHOPLASTY N/A 02/13/2014   Procedure: KYPHOPLASTY LUMBAR FIVE;  Surgeon: Charlie Pitter, MD;  Location: [facility];  Service: Neurosurgery;  Laterality: N/A;  . LAPAROSCOPIC APPENDECTOMY N/A 01/11/2016   Procedure: APPENDECTOMY LAPAROSCOPIC;  Surgeon: Florene Glen, MD;  Location: [facility] ORS;  Service: General;  Laterality: N/A;    Vitals:   07/11/16 0906  BP: (!) 158/87  Pulse: (!) 58  SpO2: 100%        Subjective Assessment - 07/11/16 0906    Subjective Pt reports she is doing well on this date. She tried to incorporate some of the pool exercises that she was given at  last session but they were difficult. No specific questions or concerns at this time.    Patient is accompained by: Family member   Pertinent History Patient states she had her appendix out in September 2017. Patient and her husband state that patient has had a decline in her mobility and balance since the surgery. Patient reports she has had 3-4 falls in the past six months and several near miss falls. Patient states she had a fall in the kitchen when she turned to reach for something. Patient states she cannot do quick turns. Patient states she is afraid of falling and states she feels she does not have the same stability as she did before. Patient feels her gait has deteriorted and is using her cane all the time. They report that the patient is not picking up her feet and catches her left toe instead of heel strike first. Patient exercises daily including going to the pool 5 days a week, doing the Nustep machine for 15-20 minutes daily, treadmill walking 7 minutes and sled. In addition, patient does Theraband exercises and works on high stepping and ladder work. Patient does about 15-20 minutes of these exercises. Patient states she had a kyphoplasty due to a compression fracture from straining using the restroom in August 2015 and the surgery was October 2015. Patient has a history of multiple compression  fractures involving T8, T12 and L5 per patient and husband report. Patient has a history of osteoporosis and patient suffered from TIA in 2008 and stroke in 2009 affecting primarily her right leg. Patient states she had a kyphoplasty due to a compression fracture from straining using the restroom in August 2015 and the surgery was October 2015. Patient has a history of multiple compression fractures involving T8, T12 and L5 per patient and husband report. Patient has a history of osteoporosis and patient suffered from TIA in 2008 and stroke in 2009 affecting primarily her right leg.     Patient Stated Goals  patient states she would like to be able to walk better, be able to turn around better and to have improved balance   Currently in Pain? No/denies          TREATMENT  On firm surface and then on Airex pad, worked on eyes closed static holds with feet together progressions and semi-tandem stance with alternate lead leg. Patient leaning posterolaterally primarily to right and tends to put increased weight on right leg as compared to left leg but improved with practice. Patient reports fatigue in calf muscles with this activity. Patient required assistance ranging from CGA to Min A as well as intermittent UE touchdown on // bars;  Performed alternating feet side and then forward lunges with toe touch holds for 3-5 seconds and then worked on SLS progressions. Patient able to hold SLS for 1-3 seconds and demonstrates increased difficulty with SLS on right leg. Cues provided for power and speed when pushing off from leading LE;  Stepping stone toe taps, varying sequence on therapist command, 1 and then 2 stone sequences followed by sequencing with increased speed;  Single leg stance practice in // bars starting with UE support and working on removing UE support for longer periods of time;  Patient performed seated in wheeled chair 2 laps about (150' each) of forward propulsion of self in chair working on hamstring strengthening. Patient required one short rest break secondary to fatigue between sets;                        PT Education - 07/11/16 0943    Education provided Yes   Education Details Discussed power/speed component to pool exercises   Person(s) Educated Patient;Spouse   Methods Explanation   Comprehension Verbalized understanding             PT Long Term Goals - 06/24/16 1140      PT LONG TERM GOAL #1   Title Patient will reduce falls risk as indicated by Activities Specific Balance Confidence Scale (ABC) >67% by 08/08/2016.   Baseline scored 33% on  06/13/2016.   Time 8   Period Weeks   Status On-going     PT LONG TERM GOAL #2   Title Patient will be able to perform home program independently for self-management.   Time 8   Period Weeks   Status On-going     PT LONG TERM GOAL #3   Title Patient will improve self-selected gait speed to 0.7 m/s or greater on 10 m walk to indicate reduced falls risk, improved community mobility and independence with ADL's.   Baseline average gait speed on 06/13/2016 was 0.59 m/sec   Time 8   Period Weeks   Status On-going     PT LONG TERM GOAL #4   Title Patient will score less than 14 seconds on the TUG test to indicate  reduced falls risk.    Baseline scored 19 sec on 06/13/2016   Time 8   Period Weeks   Status On-going     PT LONG TERM GOAL #5   Title Patient will reduce falls risk as indicated by Merrilee Jansky Balance Scale Score of 49/56 or greater.   Baseline scored 42/56 on 06/13/2016   Time 8   Period Weeks   Status On-going     Additional Long Term Goals   Additional Long Term Goals Yes     PT LONG TERM GOAL #6   Title Patient will have improved score of 9 or more points on teh lower extremilty functional scale in order to improve patients ability to perform ADLs.   Baseline scored 26/80 on 06/16/2016   Time 8   Period Weeks   Status New               Plan - 07/11/16 0944    Clinical Impression Statement Pt continues to demonstrate difficulty with R single leg balance as well as balance on uneven surfaces. Continue to emphasize as previously discussed the importance of speed/power production in decreasing fall risk and improving function. Discussed incorporating this into some of her pool exercises. Maintained low exercise intensity today to minimize fatigue and improve tolerance with HEP over the weekend. Pt encouraged to follow-up as scheduled.    Rehab Potential Fair   Clinical Impairments Affecting Rehab Potential Positive indicators: motivated, family support     Negative indicators:  prior CVA with residual deficits primarily affecting right LE   PT Frequency 2x / week   PT Duration 8 weeks   PT Treatment/Interventions Balance training;Therapeutic exercise;Therapeutic activities;Gait training;Stair training;Neuromuscular re-education;Patient/family education;Functional mobility training   PT Next Visit Plan continue hip flexor, abduction and adductor strengthening bilaterally, LEs strengthening, standing mini squats with lateral side stepping, firm and Airex FT and ST progressions with EO/EC; mini-lunge and SLS activities; activities that involve transitional movement speed   PT Home Exercise Plan sidelying clam shells, bridging, SLR, slow marching with faded UEs assist, reverse clam shells with theraband resistance, seated theraband resisted hip abd,add and flexion.    Consulted and Agree with Plan of Care Patient;Family member/caregiver   Family Member Consulted husband- Angelicia Hirschi      Patient will benefit from skilled therapeutic intervention in order to improve the following deficits and impairments:  Decreased balance, Difficulty walking, Decreased strength, Decreased range of motion, Decreased mobility, Pain  Visit Diagnosis: Difficulty in walking, not elsewhere classified  Repeated falls     Problem List Patient Active Problem List   Diagnosis Date Noted  . Influenza A 05/27/2016  . Acne rosacea 01/15/2016  . Colonic diverticular disease 01/15/2016  . Obesity 01/15/2016  . S/P appendectomy   . Acne erythematosa 10/18/2015  . Adiposity 10/18/2015  . Allergic rhinitis 10/18/2015  . Cerebrovascular accident (CVA) ([place]) 10/18/2015  . Compression fracture of thoracic vertebra ([place]) 10/18/2015  . Diverticulosis of large intestine without perforation or abscess without bleeding 10/18/2015  . Esophagitis 10/18/2015  . Fibromyalgia 10/18/2015  . Candidiasis of breast 04/28/2015  . Candida glabrata infection 04/28/2015  . Long-term use of high-risk medication  04/13/2014  . History of high risk medication treatment 04/13/2014  . Lumbar compression fracture ([place]) 02/13/2014  . Compression fracture of L5 lumbar vertebra ([place]) 01/25/2014  . Compression fracture of lumbar vertebra ([place]) 01/25/2014  . Medicare annual wellness visit, subsequent 09/28/2012  . Encounter for general adult medical examination without abnormal findings 09/28/2012  .  History of thyroid nodule 06/13/2012  . Benign colonic polyp 06/13/2012  . History of thyroid disease 06/13/2012  . OSA on CPAP 06/06/2012  . Cerebrovascular disease 06/04/2012  . Hypertension 06/04/2012  . Hyperlipidemia 06/04/2012  . Osteoporosis 06/04/2012   Phillips Grout PT, DPT   Ariele Vidrio 07/11/2016, 9:51 AM  [place] MAIN [facility] SERVICES [address] [place], [place], [address] Phone: [phone]   Fax:  [phone]  Name: Ellen Hamilton MRN: [id number] Date of Birth: 1951/04/01

## 2016-07-15 ENCOUNTER — Ambulatory Visit: Payer: Medicare Other

## 2016-07-15 VITALS — BP 132/75 | HR 58

## 2016-07-15 DIAGNOSIS — R262 Difficulty in walking, not elsewhere classified: Secondary | ICD-10-CM

## 2016-07-15 DIAGNOSIS — R296 Repeated falls: Secondary | ICD-10-CM

## 2016-07-15 NOTE — Therapy (Signed)
[place] MAIN [facility] SERVICES [address] [place], [place], [address] Phone: [phone]   Fax:  [phone]  Physical Therapy Treatment  Patient Details  Name: Ellen Hamilton MRN: [id number] Date of Birth: [birth year] Referring Provider: recurrent falls  Encounter Date: 07/15/2016      PT End of Session - 07/15/16 0858    Visit Number 10   Number of Visits 17   Date for PT Re-Evaluation 08/08/16   Authorization Type Needs G codes 10/10   PT Start Time 0900   PT Stop Time 0945   PT Time Calculation (min) 45 min   Equipment Utilized During Treatment Gait belt   Activity Tolerance Patient tolerated treatment well   Behavior During Therapy WFL for tasks assessed/performed      Past Medical History:  Diagnosis Date  . Compression fracture of L5 lumbar vertebra ([place]) 01/2014  . Fibromyalgia   . GERD (gastroesophageal reflux disease)   . History of transient ischemic attack (TIA) [id number]  . Hyperlipidemia   . Hypertension   . Osteoporosis   . Sleep apnea 2009   sleep study done in [place]  . Stroke [facility]) [phone]   pt has had two    Past Surgical History:  Procedure Laterality Date  . BACK SURGERY    . BUNIONECTOMY Bilateral [id number]  . CATARACT EXTRACTION W/ INTRAOCULAR LENS  IMPLANT, BILATERAL  2010  . EYE SURGERY    . kyphoplaasty  2010  . KYPHOPLASTY N/A 02/13/2014   Procedure: KYPHOPLASTY LUMBAR FIVE;  Surgeon: Charlie Pitter, MD;  Location: [place] NEURO ORS;  Service: Neurosurgery;  Laterality: N/A;  . LAPAROSCOPIC APPENDECTOMY N/A 01/11/2016   Procedure: APPENDECTOMY LAPAROSCOPIC;  Surgeon: Florene Glen, MD;  Location: [facility] ORS;  Service: General;  Laterality: N/A;    Vitals:   07/15/16 0902  BP: 132/75  Pulse: (!) 58  SpO2: 100%        Subjective Assessment - 07/15/16 0858    Subjective Pt states she is well on this date. She has been incorporating all of the exercises into her HEP without issue. No recent  medication or health changes. No specific questions or concerns. Denies pain.    Patient is accompained by: Family member   Pertinent History Patient states she had her appendix out in September 2017. Patient and her husband state that patient has had a decline in her mobility and balance since the surgery. Patient reports she has had 3-4 falls in the past six months and several near miss falls. Patient states she had a fall in the kitchen when she turned to reach for something. Patient states she cannot do quick turns. Patient states she is afraid of falling and states she feels she does not have the same stability as she did before. Patient feels her gait has deteriorted and is using her cane all the time. They report that the patient is not picking up her feet and catches her left toe instead of heel strike first. Patient exercises daily including going to the pool 5 days a week, doing the Nustep machine for 15-20 minutes daily, treadmill walking 7 minutes and sled. In addition, patient does Theraband exercises and works on high stepping and ladder work. Patient does about 15-20 minutes of these exercises. Patient states she had a kyphoplasty due to a compression fracture from straining using the restroom in August 2015 and the surgery was October 2015. Patient has a history of multiple compression fractures involving T8, T12  and L5 per patient and husband report. Patient has a history of osteoporosis and patient suffered from TIA in 2008 and stroke in 2009 affecting primarily her right leg. Patient states she had a kyphoplasty due to a compression fracture from straining using the restroom in August 2015 and the surgery was October 2015. Patient has a history of multiple compression fractures involving T8, T12 and L5 per patient and husband report. Patient has a history of osteoporosis and patient suffered from TIA in 2008 and stroke in 2009 affecting primarily her right leg.     Patient Stated Goals patient  states she would like to be able to walk better, be able to turn around better and to have improved balance   Currently in Pain? No/denies            [facility] PT Assessment - 07/15/16 0905      Observation/Other Assessments   Other Surveys  Other Surveys   Activities of Balance Confidence Scale (ABC Scale)  33.4%   Lower Extremity Functional Scale  16/80     Standardized Balance Assessment   Standardized Balance Assessment Berg Balance Test;Timed Up and Go Test;10 meter walk test   10 Meter Walk 12.35=0.81 m/s     Berg Balance Test   Sit to Stand Able to stand without using hands and stabilize independently   Standing Unsupported Able to stand safely 2 minutes   Sitting with Back Unsupported but Feet Supported on Floor or Stool Able to sit safely and securely 2 minutes   Stand to Sit Sits safely with minimal use of hands   Transfers Able to transfer safely, minor use of hands   Standing Unsupported with Eyes Closed Able to stand 10 seconds safely   Standing Ubsupported with Feet Together Able to place feet together independently and stand 1 minute safely   From Standing, Reach Forward with Outstretched Arm Can reach confidently >25 cm (10")   From Standing Position, Pick up Object from Floor Able to pick up shoe safely and easily   From Standing Position, Turn to Look Behind Over each Shoulder Looks behind from both sides and weight shifts well   Turn 360 Degrees Able to turn 360 degrees safely but slowly   Standing Unsupported, Alternately Place Feet on Step/Stool Able to stand independently and safely and complete 8 steps in 20 seconds   Standing Unsupported, One Foot in Front Able to plae foot ahead of the other independently and hold 30 seconds   Standing on One Leg Able to lift leg independently and hold equal to or more than 3 seconds   Total Score 51     Timed Up and Go Test   TUG Normal TUG   Normal TUG (seconds) 13.8        TREATMENT  Neuromuscular  Re-education Repeated outcome measures with patient including BERG, TUG, and 29m gait speed; Pt completed self-report surveys ABC and LEFS (unbilled); Extensive discussion about results of outcome measures and plan of care. Pt expressed continued lack of confidence in balance during gait. She is also frustrated regarding her lack of R hip flexion strength which makes it difficult to place her R foot up on shower bench or move her foot from gas to brake while driving  Ther-ex Assessed R hip flexion strength in supine. Pt only able to perform approximately 30 degrees of hip flexion during SLR. With R knee flexed maximally able to flex hip to approximately 90 degrees. Passive R hip flexion is full and  symmetrical to active L hip flexion which is approximately 110 degrees. Worked on posterior pelvic tilt, abdominal engagement, and active R hip flexion in hooklying position as far as possible (maximal knee flexion). Then pt taken passively to full R hip flexion position and provided gentle manual resistance, 3 second hold x 10 to strengthen hip flexors in full shortened position. Progression includes being able to actively flex R hip to maximal end range flexion and then gradually increasing R knee extension to make more difficult; Hooklying manually resisted clams x 10; Hooklying manually resisted hip adduction x 10; Educated about modifications to SLR to improve strength in shortened position;                 PT Education - 07/15/16 0858    Education provided Yes   Education Details Plan of care, goals, hip flexion strengthening   Person(s) Educated Patient;Spouse   Methods Explanation   Comprehension Verbalized understanding             PT Long Term Goals - 07/15/16 0859      PT LONG TERM GOAL #1   Title Patient will reduce falls risk as indicated by Activities Specific Balance Confidence Scale (ABC) >67% by 08/08/2016.   Baseline scored 33% on 06/13/2016., 07/15/16: 33.4%   Time  8   Period Weeks   Status On-going     PT LONG TERM GOAL #2   Title Patient will be able to perform home program independently for self-management.   Time 8   Period Weeks   Status On-going     PT LONG TERM GOAL #3   Title Patient will improve self-selected gait speed to 0.7 m/s or greater on 10 m walk to indicate reduced falls risk, improved community mobility and independence with ADL's.   Baseline average gait speed on 06/13/2016 was 0.59 m/sec, 07/15/16: 0.81 m/s   Time 8   Period Weeks   Status Achieved     PT LONG TERM GOAL #4   Title Patient will score less than 14 seconds on the TUG test to indicate reduced falls risk.    Baseline scored 19 sec on 06/13/2016, 07/15/16: 13.8 seconds   Time 8   Period Weeks   Status Achieved     PT LONG TERM GOAL #5   Title Patient will reduce falls risk as indicated by Merrilee Jansky Balance Scale Score of 49/56 or greater.   Baseline scored 42/56 on 06/13/2016; 07/15/16: 51/56   Time 8   Period Weeks   Status Achieved     Additional Long Term Goals   Additional Long Term Goals Yes     PT LONG TERM GOAL #6   Title Patient will have improved score of 9 or more points on teh lower extremilty functional scale in order to improve patients ability to perform ADLs.   Baseline scored 26/80 on 06/16/2016; 07/15/16: 16/80   Time 8   Period Weeks   Status On-going     PT LONG TERM GOAL #7   Title Patient will reduce falls risk as indicated by Merrilee Jansky Balance Scale Score of 54/56 or greater.   Baseline 07/15/16: 51/56   Time 8   Period Weeks   Status New               Plan - 07/15/16 [id number]    Clinical Impression Statement Pt demonstrates very significant improvement in her outcome measures on this date. Her BERG score improved from 42/56 at initial evaluation to 51/56 today.  Gait speed increased from 0.59 m/s to 0.81 m/s and TUG decreased from 19 seconds to 13.8 seconds. She is still laking confidence in her balance and lower extremity strength at her ABC is  unchanged and her LEFS decreased. She is frustrated by her lack of ability to lift her RLE up onto shower chair as well as off gas pedal when driving. Pt lacking R hip flexion strength when in shortened position. Pt provided home exercise to work on R hip flexion strength in shortened position. Pt will continue to benefit from skilled PT services to address remaining deficits and improve function as well as confidence at home and with recreational activities.    Rehab Potential Fair   Clinical Impairments Affecting Rehab Potential Positive indicators: motivated, family support     Negative indicators: prior CVA with residual deficits primarily affecting right LE   PT Frequency 2x / week   PT Duration 8 weeks   PT Treatment/Interventions Balance training;Therapeutic exercise;Therapeutic activities;Gait training;Stair training;Neuromuscular re-education;Patient/family education;Functional mobility training   PT Next Visit Plan continue hip flexor, abduction and adductor strengthening bilaterally, LEs strengthening, standing mini squats with lateral side stepping, firm and Airex FT and ST progressions with EO/EC; mini-lunge and SLS activities; activities that involve transitional movement speed   PT Home Exercise Plan sidelying clam shells, bridging, SLR (start with R knee flexed until she can perform through full range), slow marching with faded UEs assist, reverse clam shells with theraband resistance, seated theraband resisted hip abd,add and flexion.    Consulted and Agree with Plan of Care Patient;Family member/caregiver   Family Member Consulted husband- Shaylee Trace      Patient will benefit from skilled therapeutic intervention in order to improve the following deficits and impairments:  Decreased balance, Difficulty walking, Decreased strength, Decreased range of motion, Decreased mobility, Pain  Visit Diagnosis: Difficulty in walking, not elsewhere classified  Repeated falls       G-Codes  - Jul 24, 2016 1009    Functional Assessment Tool Used (Outpatient Only) clinical judgment, BERG, TUG, ABC scale, 10 meter walking speed, LEFS   Functional Limitation Mobility: Walking and moving around   Mobility: Walking and Moving Around Current Status [phone]) At least 40 percent but less than 60 percent impaired, limited or restricted   Mobility: Walking and Moving Around Goal Status [phone]) At least 20 percent but less than 40 percent impaired, limited or restricted      Problem List Patient Active Problem List   Diagnosis Date Noted  . Influenza A 05/27/2016  . Acne rosacea 01/15/2016  . Colonic diverticular disease 01/15/2016  . Obesity 01/15/2016  . S/P appendectomy   . Acne erythematosa 10/18/2015  . Adiposity 10/18/2015  . Allergic rhinitis 10/18/2015  . Cerebrovascular accident (CVA) ([place]) 10/18/2015  . Compression fracture of thoracic vertebra ([place]) 10/18/2015  . Diverticulosis of large intestine without perforation or abscess without bleeding 10/18/2015  . Esophagitis 10/18/2015  . Fibromyalgia 10/18/2015  . Candidiasis of breast 04/28/2015  . Candida glabrata infection 04/28/2015  . Long-term use of high-risk medication 04/13/2014  . History of high risk medication treatment 04/13/2014  . Lumbar compression fracture ([place]) 02/13/2014  . Compression fracture of L5 lumbar vertebra ([place]) 01/25/2014  . Compression fracture of lumbar vertebra ([place]) 01/25/2014  . Medicare annual wellness visit, subsequent 09/28/2012  . Encounter for general adult medical examination without abnormal findings 09/28/2012  . History of thyroid nodule 06/13/2012  . Benign colonic polyp 06/13/2012  . History of thyroid disease 06/13/2012  .  OSA on CPAP 06/06/2012  . Cerebrovascular disease 06/04/2012  . Hypertension 06/04/2012  . Hyperlipidemia 06/04/2012  . Osteoporosis 06/04/2012   Phillips Grout PT, DPT   Donnie Gedeon 07/15/2016, 10:12 AM  [place]  MAIN [facility] SERVICES [address] [place], [place], [address] Phone: [phone]   Fax:  [phone]  Name: Ellen Hamilton MRN: [id number] Date of Birth: [birth year]

## 2016-07-18 ENCOUNTER — Ambulatory Visit: Payer: Medicare Other | Admitting: Physical Therapy

## 2016-07-22 ENCOUNTER — Ambulatory Visit: Payer: Medicare Other | Admitting: Physical Therapy

## 2016-07-25 ENCOUNTER — Encounter: Payer: Self-pay | Admitting: Physical Therapy

## 2016-07-25 ENCOUNTER — Ambulatory Visit: Payer: Medicare Other | Admitting: Physical Therapy

## 2016-07-25 DIAGNOSIS — R296 Repeated falls: Secondary | ICD-10-CM

## 2016-07-25 DIAGNOSIS — R262 Difficulty in walking, not elsewhere classified: Secondary | ICD-10-CM | POA: Diagnosis not present

## 2016-07-25 NOTE — Therapy (Signed)
[place] MAIN [facility] SERVICES [address] [place], [place], [address] Phone: [phone]   Fax:  [phone]  Physical Therapy Treatment  Patient Details  Name: Ellen Hamilton MRN: [id number] Date of Birth: 1950-06-03 Referring Provider: recurrent falls  Encounter Date: 07/25/2016      PT End of Session - 07/25/16 1454    Visit Number 11   Number of Visits 17   Date for PT Re-Evaluation 08/08/16   Authorization Type Needs G codes    Authorization Time Period 11/20   PT Start Time 0111   PT Stop Time 0205   PT Time Calculation (min) 54 min   Equipment Utilized During Treatment Gait belt   Activity Tolerance Patient tolerated treatment well   Behavior During Therapy WFL for tasks assessed/performed      Past Medical History:  Diagnosis Date  . Compression fracture of L5 lumbar vertebra ([place]) 01/2014  . Fibromyalgia   . GERD (gastroesophageal reflux disease)   . History of transient ischemic attack (TIA) 1950,9326  . Hyperlipidemia   . Hypertension   . Osteoporosis   . Sleep apnea 2009   sleep study done in [place]  . Stroke [facility]) [phone]   pt has had two    Past Surgical History:  Procedure Laterality Date  . BACK SURGERY    . BUNIONECTOMY Bilateral [id number]  . CATARACT EXTRACTION W/ INTRAOCULAR LENS  IMPLANT, BILATERAL  2010  . EYE SURGERY    . kyphoplaasty  2010  . KYPHOPLASTY N/A 02/13/2014   Procedure: KYPHOPLASTY LUMBAR FIVE;  Surgeon: Charlie Pitter, MD;  Location: [place] [facility];  Service: Neurosurgery;  Laterality: N/A;  . LAPAROSCOPIC APPENDECTOMY N/A 01/11/2016   Procedure: APPENDECTOMY LAPAROSCOPIC;  Surgeon: Florene Glen, MD;  Location: [facility] ORS;  Service: General;  Laterality: N/A;    There were no vitals filed for this visit.      Subjective Assessment - 07/25/16 1451    Subjective Patient states it took her awhile to get her legs going this morning. Patient reports that she has been doing a  variety of pool, NuStep, treadmill and mat strengthening exercises at home.    Patient is accompained by: --   Pertinent History Patient states she had her appendix out in September 2017. Patient and her husband state that patient has had a decline in her mobility and balance since the surgery. Patient reports she has had 3-4 falls in the past six months and several near miss falls. Patient states she had a fall in the kitchen when she turned to reach for something. Patient states she cannot do quick turns. Patient states she is afraid of falling and states she feels she does not have the same stability as she did before. Patient feels her gait has deteriorted and is using her cane all the time. They report that the patient is not picking up her feet and catches her left toe instead of heel strike first. Patient exercises daily including going to the pool 5 days a week, doing the Nustep machine for 15-20 minutes daily, treadmill walking 7 minutes and sled. In addition, patient does Theraband exercises and works on high stepping and ladder work. Patient does about 15-20 minutes of these exercises. Patient states she had a kyphoplasty due to a compression fracture from straining using the restroom in August 2015 and the surgery was October 2015. Patient has a history of multiple compression fractures involving T8, T12 and L5 per patient and husband  report. Patient has a history of osteoporosis and patient suffered from TIA in 2008 and stroke in 2009 affecting primarily her right leg. Patient states she had a kyphoplasty due to a compression fracture from straining using the restroom in August 2015 and the surgery was October 2015. Patient has a history of multiple compression fractures involving T8, T12 and L5 per patient and husband report. Patient has a history of osteoporosis and patient suffered from TIA in 2008 and stroke in 2009 affecting primarily her right leg.     Patient Stated Goals patient states she  would like to be able to walk better, be able to turn around better and to have improved balance   Currently in Pain? No/denies        Therapeutic Exercise: Worked on posterior pelvic tilt and active R hip flexion in hooklying position as far as possible (maximal knee flexion). Patient then taken passively to full right hip flexion position and provided gentle manual resistance, 3-5 second hold  2 sets of 10 reps to strengthen hip flexors in full shortened position.   Neuromuscular Re-education: Patient arrived to clinic and expressed concerns about the functional outcome testing and new exercises last session. Extensive discussion about reasoning for performing outcome testing, results of outcome measures and discussed the exercises that she performed last session with a different therapist. Discussed plan of care and goals. Encouragement provided. Patent expressed continued lack of confidence in balance during gait and stated she would like to be able to ambulate without SPC and to be able to turn better.    Worked on standing in // bars with faded UEs support progressing from bilateral support to intermittent support of one hand while performing alternating foot kick ball. Patient able to place foot on top of ball to stop ball but has a difficult time then lifting foot back up off the ball in order to kick it. Patient did well with being able to shift her weight right/left and to place foot on ball, but could continue to work on speed with being able to return kick the ball.    Worked on left turning while walking. When patient was first evaluated she was requiring about 5-6 short shuffling steps to be able to complete a left hand turn. This date she was able to turn left using about 3-4 steps. Demonstrated and discussed techniques to improve turning efficiency and foot placement and patient was able to decrease steps required to 2-3. She was able to take larger steps during turning with decreased  time required to complete the turn.       PT Education - 07/25/16 1453    Education provided Yes   Education Details Discussed plan of care, goals   Person(s) Educated Patient   Methods Explanation;Demonstration   Comprehension Verbalized understanding             PT Long Term Goals - 07/15/16 0859      PT LONG TERM GOAL #1   Title Patient will reduce falls risk as indicated by Activities Specific Balance Confidence Scale (ABC) >67% by 08/08/2016.   Baseline scored 33% on 06/13/2016., 07/15/16: 33.4%   Time 8   Period Weeks   Status On-going     PT LONG TERM GOAL #2   Title Patient will be able to perform home program independently for self-management.   Time 8   Period Weeks   Status On-going     PT LONG TERM GOAL #3   Title Patient will  improve self-selected gait speed to 0.7 m/s or greater on 10 m walk to indicate reduced falls risk, improved community mobility and independence with ADL's.   Baseline average gait speed on 06/13/2016 was 0.59 m/sec, 07/15/16: 0.81 m/s   Time 8   Period Weeks   Status Achieved     PT LONG TERM GOAL #4   Title Patient will score less than 14 seconds on the TUG test to indicate reduced falls risk.    Baseline scored 19 sec on 06/13/2016, 07/15/16: 13.8 seconds   Time 8   Period Weeks   Status Achieved     PT LONG TERM GOAL #5   Title Patient will reduce falls risk as indicated by Merrilee Jansky Balance Scale Score of 49/56 or greater.   Baseline scored 42/56 on 06/13/2016; 07/15/16: 51/56   Time 8   Period Weeks   Status Achieved     Additional Long Term Goals   Additional Long Term Goals Yes     PT LONG TERM GOAL #6   Title Patient will have improved score of 9 or more points on teh lower extremilty functional scale in order to improve patients ability to perform ADLs.   Baseline scored 26/80 on 06/16/2016; 07/15/16: 16/80   Time 8   Period Weeks   Status On-going     PT LONG TERM GOAL #7   Title Patient will reduce falls risk as indicated by Merrilee Jansky  Balance Scale Score of 54/56 or greater.   Baseline 07/15/16: 51/56   Time 8   Period Weeks   Status New               Plan - 07/25/16 1455    Clinical Impression Statement Patient expressed concerns in regards to the functional outcome testing performed last session as well as the new exercises. Increased time spent discussing functional outcome testing, plan of care and patient goals this session. Patient would like to focus on improving her balance, walking and ability to turn. Patient expresses that she would like to be able to ambulate without SPC. Patient worked on Programmer, systems this date as well as strategies to improving turning. Patient was able to improve from taking 3-4 steps to 2-3 steps with left turns with practice this date. Encourage patient to follow-up as indicated.    Rehab Potential Fair   Clinical Impairments Affecting Rehab Potential Positive indicators: motivated, family support     Negative indicators: prior CVA with residual deficits primarily affecting right LE   PT Frequency 2x / week   PT Duration 8 weeks   PT Treatment/Interventions Balance training;Therapeutic exercise;Therapeutic activities;Gait training;Stair training;Neuromuscular re-education;Patient/family education;Functional mobility training   PT Next Visit Plan kick ball alternating kicking foot and turning; continue hip flexor, abduction and adductor strengthening bilaterally, LEs strengthening, standing mini squats with lateral side stepping, firm and Airex FT and ST progressions with EO/EC; mini-lunge and SLS activities; activities that involve transitional movement speed   PT Home Exercise Plan sidelying clam shells, bridging, SLR (start with R knee flexed until she can perform through full range), slow marching with faded UEs assist, reverse clam shells with theraband resistance, seated theraband resisted hip abd,add and flexion.    Consulted and Agree with Plan of Care Patient;Family  member/caregiver   Family Member Consulted husband- Ezri Fanguy      Patient will benefit from skilled therapeutic intervention in order to improve the following deficits and impairments:  Decreased balance, Difficulty walking, Decreased strength, Decreased range of motion, Decreased  mobility, Pain  Visit Diagnosis: Difficulty in walking, not elsewhere classified  Repeated falls     Problem List Patient Active Problem List   Diagnosis Date Noted  . Influenza A 05/27/2016  . Acne rosacea 01/15/2016  . Colonic diverticular disease 01/15/2016  . Obesity 01/15/2016  . S/P appendectomy   . Acne erythematosa 10/18/2015  . Adiposity 10/18/2015  . Allergic rhinitis 10/18/2015  . Cerebrovascular accident (CVA) ([place]) 10/18/2015  . Compression fracture of thoracic vertebra ([place]) 10/18/2015  . Diverticulosis of large intestine without perforation or abscess without bleeding 10/18/2015  . Esophagitis 10/18/2015  . Fibromyalgia 10/18/2015  . Candidiasis of breast 04/28/2015  . Candida glabrata infection 04/28/2015  . Long-term use of high-risk medication 04/13/2014  . History of high risk medication treatment 04/13/2014  . Lumbar compression fracture ([place]) 02/13/2014  . Compression fracture of L5 lumbar vertebra ([place]) 01/25/2014  . Compression fracture of lumbar vertebra ([place]) 01/25/2014  . Medicare annual wellness visit, subsequent 09/28/2012  . Encounter for general adult medical examination without abnormal findings 09/28/2012  . History of thyroid nodule 06/13/2012  . Benign colonic polyp 06/13/2012  . History of thyroid disease 06/13/2012  . OSA on CPAP 06/06/2012  . Cerebrovascular disease 06/04/2012  . Hypertension 06/04/2012  . Hyperlipidemia 06/04/2012  . Osteoporosis 06/04/2012   Lady Deutscher PT, DPT Lady Deutscher 07/25/2016, 3:04 PM  [place] MAIN [facility] SERVICES [address] [place], [place], [address] Phone: [phone]    Fax:  [phone]  Name: Ruthanne Mcneish MRN: [id number] Date of Birth: 20-Aug-1950

## 2016-07-29 ENCOUNTER — Encounter: Payer: Self-pay | Admitting: Physical Therapy

## 2016-07-29 ENCOUNTER — Ambulatory Visit: Payer: Medicare Other | Admitting: Physical Therapy

## 2016-07-29 DIAGNOSIS — R296 Repeated falls: Secondary | ICD-10-CM | POA: Diagnosis not present

## 2016-07-29 DIAGNOSIS — R262 Difficulty in walking, not elsewhere classified: Secondary | ICD-10-CM

## 2016-07-29 NOTE — Therapy (Signed)
[place] MAIN [facility] SERVICES [address] [place], [place], [address] Phone: [phone]   Fax:  [phone]  Physical Therapy Treatment  Patient Details  Name: Ellen Hamilton MRN: [id number] Date of Birth: 28-Jan-1951 Referring Provider: recurrent falls  Encounter Date: 07/29/2016      PT End of Session - 07/29/16 1019    Visit Number 12   Number of Visits 17   Date for PT Re-Evaluation 08/08/16   Authorization Type Needs G codes    Authorization Time Period 12/20   PT Start Time 1010   PT Stop Time 1100   PT Time Calculation (min) 50 min   Equipment Utilized During Treatment Gait belt   Activity Tolerance Patient tolerated treatment well   Behavior During Therapy [facility] for tasks assessed/performed      Past Medical History:  Diagnosis Date  . Compression fracture of L5 lumbar vertebra ([place]) 01/2014  . Fibromyalgia   . GERD (gastroesophageal reflux disease)   . History of transient ischemic attack (TIA) [id number]  . Hyperlipidemia   . Hypertension   . Osteoporosis   . Sleep apnea 2009   sleep study done in [place]  . Stroke [facility]) [phone]   pt has had two    Past Surgical History:  Procedure Laterality Date  . BACK SURGERY    . BUNIONECTOMY Bilateral [id number]  . CATARACT EXTRACTION W/ INTRAOCULAR LENS  IMPLANT, BILATERAL  2010  . EYE SURGERY    . kyphoplaasty  2010  . KYPHOPLASTY N/A 02/13/2014   Procedure: KYPHOPLASTY LUMBAR FIVE;  Surgeon: Charlie Pitter, MD;  Location: [place] [facility];  Service: Neurosurgery;  Laterality: N/A;  . LAPAROSCOPIC APPENDECTOMY N/A 01/11/2016   Procedure: APPENDECTOMY LAPAROSCOPIC;  Surgeon: Florene Glen, MD;  Location: [facility] ORS;  Service: General;  Laterality: N/A;    There were no vitals filed for this visit.      Subjective Assessment - 07/29/16 1017    Subjective Patient states that practicing kicking in the water has been a good exercise and she states she feels fatigue in her  legs after performing.    Patient is accompained by: Family member   Pertinent History Patient states she had her appendix out in September 2017. Patient and her husband state that patient has had a decline in her mobility and balance since the surgery. Patient reports she has had 3-4 falls in the past six months and several near miss falls. Patient states she had a fall in the kitchen when she turned to reach for something. Patient states she cannot do quick turns. Patient states she is afraid of falling and states she feels she does not have the same stability as she did before. Patient feels her gait has deteriorted and is using her cane all the time. They report that the patient is not picking up her feet and catches her left toe instead of heel strike first. Patient exercises daily including going to the pool 5 days a week, doing the Nustep machine for 15-20 minutes daily, treadmill walking 7 minutes and sled. In addition, patient does Theraband exercises and works on high stepping and ladder work. Patient does about 15-20 minutes of these exercises. Patient states she had a kyphoplasty due to a compression fracture from straining using the restroom in August 2015 and the surgery was October 2015. Patient has a history of multiple compression fractures involving T8, T12 and L5 per patient and husband report. Patient has a history of osteoporosis  and patient suffered from TIA in 2008 and stroke in 2009 affecting primarily her right leg. Patient states she had a kyphoplasty due to a compression fracture from straining using the restroom in August 2015 and the surgery was October 2015. Patient has a history of multiple compression fractures involving T8, T12 and L5 per patient and husband report. Patient has a history of osteoporosis and patient suffered from TIA in 2008 and stroke in 2009 affecting primarily her right leg.     Patient Stated Goals patient states she would like to be able to walk better, be  able to turn around better and to have improved balance   Currently in Pain? No/denies       Neuromuscular Re-education:  Kick Ball:  Patient worked on Associate Professor while in // bars so that patient could reach for support if needed. Focused on performing rapidly alternating kicking without placing foot on top of ball to stop the ball and then kicking the ball back as she did last session. This time patient was able to successfully return the kick without stopping the ball. In addition, focused on increasing the force of the kicking on right leg. Patient was able to perform without upper extremity support majority of the time but did reach for support several times.    High knee marching: Patient performed 1 set of 15 reps and 1 set of 20 reps each leg rapidly alternating high knee marching without upper extremity support. Patient able to raise knee to about hip level on right leg. Patient able to perform marching without demonstrating gluteus medius lurch/trendelenburg on right or left hip while performing marching. Focused on trying to increase speed and right hip flexion while maintaining good, erect trunk posture and engaging abdominal muscles.   Worked on active R hip flexion in seated position as far as possible and then provided gentle manual resistance, 3-5 second hold  2 sets of 10 reps to strengthen hip flexors in shortened position.   Treadmill Walking:  Patient ambulated utilizing gait training mode on treadmill for 6 minutes at 5.3 rate without upper extremity support. Patient was able to maintain gait step length and stride greater than 90% of the time. Patient able to maintain good alignment of right foot without ER of lower leg. Patient does have decreased right ankle DF and decreased toe off at initial swing phase as well as decreased toe clearance at mid-swing position most likely residual deficit secondary to prior CVA.      PT Education - 07/29/16 1312    Education provided  Yes   Education Details Discussed home exercise program additional exercises- seated isometric resistance from right hip flexed position and practicing quick alternating high knee marches and simulated "kick ball" focusing on increasing speed of kicks in the pool    Person(s) Educated Patient;Spouse   Methods Explanation   Comprehension Verbalized understanding             PT Long Term Goals - 07/15/16 0859      PT LONG TERM GOAL #1   Title Patient will reduce falls risk as indicated by Activities Specific Balance Confidence Scale (ABC) >67% by 08/08/2016.   Baseline scored 33% on 06/13/2016., 07/15/16: 33.4%   Time 8   Period Weeks   Status On-going     PT LONG TERM GOAL #2   Title Patient will be able to perform home program independently for self-management.   Time 8   Period Weeks   Status On-going  PT LONG TERM GOAL #3   Title Patient will improve self-selected gait speed to 0.7 m/s or greater on 10 m walk to indicate reduced falls risk, improved community mobility and independence with ADL's.   Baseline average gait speed on 06/13/2016 was 0.59 m/sec, 07/15/16: 0.81 m/s   Time 8   Period Weeks   Status Achieved     PT LONG TERM GOAL #4   Title Patient will score less than 14 seconds on the TUG test to indicate reduced falls risk.    Baseline scored 19 sec on 06/13/2016, 07/15/16: 13.8 seconds   Time 8   Period Weeks   Status Achieved     PT LONG TERM GOAL #5   Title Patient will reduce falls risk as indicated by Merrilee Jansky Balance Scale Score of 49/56 or greater.   Baseline scored 42/56 on 06/13/2016; 07/15/16: 51/56   Time 8   Period Weeks   Status Achieved     Additional Long Term Goals   Additional Long Term Goals Yes     PT LONG TERM GOAL #6   Title Patient will have improved score of 9 or more points on teh lower extremilty functional scale in order to improve patients ability to perform ADLs.   Baseline scored 26/80 on 06/16/2016; 07/15/16: 16/80   Time 8   Period Weeks    Status On-going     PT LONG TERM GOAL #7   Title Patient will reduce falls risk as indicated by Merrilee Jansky Balance Scale Score of 54/56 or greater.   Baseline 07/15/16: 51/56   Time 8   Period Weeks   Status New               Plan - 07/29/16 1019    Clinical Impression Statement Patient demonstrated good carry-over with utilizing turning strategies from last session. Patient able to perform ambulation short distances working on left/right turning without LOB balance this date demonstrating 2-3 steps with turning which is an improvement from last session as patient was utilizing [facility] and was more consistent with left turning utilizing 2-3 steps this date. Patient did well with gait training program on the treadmill this date and was able to maintain good step length 90% or greater of the time during 6 minute ambulation. Patient provided with additional options for home exercise focusing on right hip flexor strengthening as well as improving speed with alternating LE movements. Patient would benefit from continued PT services to address goals and functional deficits.    Rehab Potential Fair   Clinical Impairments Affecting Rehab Potential Positive indicators: motivated, family support     Negative indicators: prior CVA with residual deficits primarily affecting right LE   PT Frequency 2x / week   PT Duration 8 weeks   PT Treatment/Interventions Balance training;Therapeutic exercise;Therapeutic activities;Gait training;Stair training;Neuromuscular re-education;Patient/family education;Functional mobility training   PT Next Visit Plan kick ball alternating kicking foot and turning; continue hip flexor, abduction and adductor strengthening bilaterally, LEs strengthening, standing mini squats with lateral side stepping, firm and Airex FT and ST progressions with EO/EC; mini-lunge and SLS activities; activities that involve transitional movement speed   PT Home Exercise Plan sidelying clam shells,  bridging, SLR (start with R knee flexed until she can perform through full range), slow marching with faded UEs assist, reverse clam shells with theraband resistance, seated theraband resisted hip abd,add and flexion. In the pool working on quick alternating high knee marching and simulated "kick ball" focusing on speed and strength of kicking.  working on right/ left turning.   Consulted and Agree with Plan of Care Patient;Family member/caregiver   Family Member Consulted husband- Nyx Keady      Patient will benefit from skilled therapeutic intervention in order to improve the following deficits and impairments:  Decreased balance, Difficulty walking, Decreased strength, Decreased range of motion, Decreased mobility, Pain  Visit Diagnosis: Difficulty in walking, not elsewhere classified  Repeated falls     Problem List Patient Active Problem List   Diagnosis Date Noted  . Influenza A 05/27/2016  . Acne rosacea 01/15/2016  . Colonic diverticular disease 01/15/2016  . Obesity 01/15/2016  . S/P appendectomy   . Acne erythematosa 10/18/2015  . Adiposity 10/18/2015  . Allergic rhinitis 10/18/2015  . Cerebrovascular accident (CVA) ([place]) 10/18/2015  . Compression fracture of thoracic vertebra ([place]) 10/18/2015  . Diverticulosis of large intestine without perforation or abscess without bleeding 10/18/2015  . Esophagitis 10/18/2015  . Fibromyalgia 10/18/2015  . Candidiasis of breast 04/28/2015  . Candida glabrata infection 04/28/2015  . Long-term use of high-risk medication 04/13/2014  . History of high risk medication treatment 04/13/2014  . Lumbar compression fracture ([place]) 02/13/2014  . Compression fracture of L5 lumbar vertebra ([place]) 01/25/2014  . Compression fracture of lumbar vertebra ([place]) 01/25/2014  . Medicare annual wellness visit, subsequent 09/28/2012  . Encounter for general adult medical examination without abnormal findings 09/28/2012  . History of thyroid nodule  06/13/2012  . Benign colonic polyp 06/13/2012  . History of thyroid disease 06/13/2012  . OSA on CPAP 06/06/2012  . Cerebrovascular disease 06/04/2012  . Hypertension 06/04/2012  . Hyperlipidemia 06/04/2012  . Osteoporosis 06/04/2012   Lady Deutscher PT, DPT Lady Deutscher 07/29/2016, 1:47 PM  [place] MAIN [facility] SERVICES [address] [place], [place], [address] Phone: [phone]   Fax:  [phone]  Name: Ellen Hamilton MRN: [id number] Date of Birth: 1951-04-25

## 2016-08-04 DIAGNOSIS — Z1231 Encounter for screening mammogram for malignant neoplasm of breast: Secondary | ICD-10-CM | POA: Diagnosis not present

## 2016-08-04 LAB — HM MAMMOGRAPHY

## 2016-08-05 ENCOUNTER — Ambulatory Visit: Payer: Medicare Other | Admitting: Physical Therapy

## 2016-08-08 ENCOUNTER — Ambulatory Visit: Payer: Medicare Other | Admitting: Physical Therapy

## 2016-08-11 ENCOUNTER — Encounter: Payer: Self-pay | Admitting: Internal Medicine

## 2016-08-11 NOTE — Telephone Encounter (Signed)
Ellen Hamilton the patient has scheduled a awv with denisa on the 4th and then the other visit on the 21st is a physical. I hope that helps.

## 2016-08-12 ENCOUNTER — Encounter: Payer: Medicare Other | Admitting: Physical Therapy

## 2016-08-13 ENCOUNTER — Ambulatory Visit (INDEPENDENT_AMBULATORY_CARE_PROVIDER_SITE_OTHER): Payer: Medicare Other

## 2016-08-13 VITALS — BP 128/70 | HR 58 | Temp 97.9°F | Resp 14 | Ht 63.0 in | Wt 175.8 lb

## 2016-08-13 DIAGNOSIS — Z7901 Long term (current) use of anticoagulants: Secondary | ICD-10-CM | POA: Diagnosis not present

## 2016-08-13 DIAGNOSIS — Z0000 Encounter for general adult medical examination without abnormal findings: Secondary | ICD-10-CM

## 2016-08-13 NOTE — Patient Instructions (Addendum)
  Ms. Ousley , Thank you for taking time to come for your Medicare Wellness Visit. I appreciate your ongoing commitment to your health goals. Please review the following plan we discussed and let me know if I can assist you in the future.   Follow up with Dr. Derrel Nip as needed.    Have a great day!  These are the goals we discussed: Goals    . Increase physical activity          Walk without the use of a cane by staying active and increasing leg strength through exercises (PT, swimming, treadmill)       This is a list of the screening recommended for you and due dates:  Health Maintenance  Topic Date Due  . Flu Shot  12/10/2016  . Mammogram  08/05/2018  . Tetanus Vaccine  07/05/2024  . Colon Cancer Screening  08/27/2024  . DEXA scan (bone density measurement)  Completed  .  Hepatitis C: One time screening is recommended by Center for Disease Control  (CDC) for  adults born from [birth year] through 1966.   Completed  . Pneumonia vaccines  Completed

## 2016-08-13 NOTE — Progress Notes (Signed)
Subjective:   Ellen Hamilton is a 66 y.o. female who presents for an Initial Medicare Annual Wellness Visit.  Review of Systems    No ROS.  Medicare Wellness Visit.  Cardiac Risk Factors include: advanced age (>62men, >80 women);hypertension     Objective:    Today's Vitals   08/13/16 1056  BP: 128/70  Pulse: (!) 58  Resp: 14  Temp: 97.9 F (36.6 C)  TempSrc: Oral  SpO2: 96%  Weight: 175 lb 12.8 oz (79.7 kg)  Height: 5\' 3"  (1.6 m)   Body mass index is 31.14 kg/m.   Current Medications (verified) Outpatient Encounter Prescriptions as of 08/13/2016  Medication Sig  . amLODipine (NORVASC) 5 MG tablet Take 1 tablet (5 mg total) by mouth daily.  Marland Kitchen CALCIUM CITRATE PO Take 500 mg by mouth 3 (three) times daily.  . cholecalciferol (VITAMIN D) 1000 UNITS tablet Take 1,000 Units by mouth daily.  . clopidogrel (PLAVIX) 75 MG tablet Take 1 tablet (75 mg total) by mouth daily.  Marland Kitchen denosumab (PROLIA) 60 MG/ML SOLN injection Inject 60 mg into the skin every 6 (six) months. Administer in upper arm, thigh, or abdomen  . fish oil-omega-3 fatty acids 1000 MG capsule Take 1,000 mg by mouth daily.  Marland Kitchen ibuprofen (ADVIL,MOTRIN) 200 MG tablet Take 400 mg by mouth 2 (two) times daily.  . metoprolol succinate (TOPROL-XL) 100 MG 24 hr tablet Take 1 tablet (100 mg total) by mouth daily. Take with or immediately following a meal.  . Multiple Vitamin (MULTIVITAMIN) tablet Take 1 tablet by mouth daily.  . ranitidine (ZANTAC) 150 MG tablet Take 1 tablet (150 mg total) by mouth daily.  . rosuvastatin (CRESTOR) 40 MG tablet Take 1 tablet (40 mg total) by mouth daily.  . vitamin B-12 (CYANOCOBALAMIN) 1000 MCG tablet Take 1,000 mcg by mouth every other day.  . [DISCONTINUED] dipyridamole (PERSANTINE) 50 MG tablet TAKE 4 TABLETS (200 MG) TWICE A DAY  . [DISCONTINUED] oseltamivir (TAMIFLU) 75 MG capsule Take 1 capsule (75 mg total) by mouth 2 (two) times daily.  . [DISCONTINUED] traMADol (ULTRAM) 50 MG  tablet Take 1 tablet (50 mg total) by mouth every 6 (six) hours as needed.  . [DISCONTINUED] traMADol (ULTRAM) 50 MG tablet Take 1 tablet (50 mg total) by mouth every 6 (six) hours as needed for moderate pain.  Marland Kitchen aspirin 81 MG tablet Take 81 mg by mouth 2 (two) times daily.    No facility-administered encounter medications on file as of 08/13/2016.     Allergies (verified) Hydrocodone; Oxycodone; Septra [sulfamethoxazole-trimethoprim]; and Penicillins   History: Past Medical History:  Diagnosis Date  . Compression fracture of L5 lumbar vertebra ([place]) 01/2014  . Fibromyalgia   . GERD (gastroesophageal reflux disease)   . History of transient ischemic attack (TIA) 6659,9357  . Hyperlipidemia   . Hypertension   . Osteoporosis   . Sleep apnea 2009   sleep study done in [place]  . Stroke [facility]) [phone]   pt has had two   Past Surgical History:  Procedure Laterality Date  . BACK SURGERY    . BUNIONECTOMY Bilateral [id number]  . CATARACT EXTRACTION W/ INTRAOCULAR LENS  IMPLANT, BILATERAL  2010  . EYE SURGERY    . kyphoplaasty  2010  . KYPHOPLASTY N/A 02/13/2014   Procedure: KYPHOPLASTY LUMBAR FIVE;  Surgeon: Charlie Pitter, MD;  Location: [facility];  Service: Neurosurgery;  Laterality: N/A;  . LAPAROSCOPIC APPENDECTOMY N/A 01/11/2016   Procedure: APPENDECTOMY LAPAROSCOPIC;  Surgeon: Delfino Lovett  Melchor Amour, MD;  Location: [facility];  Service: General;  Laterality: N/A;   Family History  Problem Relation Age of Onset  . Mental illness Mother     alzheimers  . Heart disease Father   . COPD Father   . Cancer Father     possible colon CA  . Mental illness Maternal Aunt     alzheimers  . Multiple sclerosis Cousin   . Parkinson's disease Brother   . Colon cancer Neg Hx    Social History   Occupational History  . Not on file.   Social History Main Topics  . Smoking status: Never Smoker  . Smokeless tobacco: Never Used  . Alcohol use 4.2 oz/week    7 Glasses of wine per week  . Drug  use: No  . Sexual activity: Yes    Tobacco Counseling Counseling given: Not Answered   Activities of Daily Living In your present state of health, do you have any difficulty performing the following activities: 08/13/2016 01/12/2016  Hearing? N N  Vision? N N  Difficulty concentrating or making decisions? N N  Walking or climbing stairs? Y N  Dressing or bathing? N N  Doing errands, shopping? N N  Preparing Food and eating ? N -  Using the Toilet? N -  In the past six months, have you accidently leaked urine? N -  Do you have problems with loss of bowel control? N -  Managing your Medications? N -  Managing your Finances? N -  Housekeeping or managing your Housekeeping? Y -  Some recent data might be hidden    Immunizations and Health Maintenance Immunization History  Administered Date(s) Administered  . Influenza Split 03/16/2012, 02/21/2014, 02/17/2015  . Influenza-Unspecified 02/10/2013  . Pneumococcal Conjugate-13 10/11/2014  . Pneumococcal Polysaccharide-23 02/12/2010, 05/26/2016  . Td 07/05/2005  . Tdap 07/05/2014  . Zoster 04/16/2010   There are no preventive care reminders to display for this patient.  Patient Care Team: Crecencio Mc, MD as PCP - General (Internal Medicine)  Indicate any recent Medical Services you may have received from other than [facility] providers in the past year (date may be approximate).     Assessment:   This is a routine wellness examination for [name]. The goal of the wellness visit is to assist the patient how to close the gaps in care and create a preventative care plan for the patient.   Taking calcium VIT D as appropriate/Prolia/Osteoporosis reviewed.  Medications reviewed; taking without issues or barriers.  Safety issues reviewed; smoke detectors in the home. No firearms in the home.  Wears seatbelts when driving or riding with others. Patient does wear sunscreen or protective clothing when in direct sunlight. No violence  in the home.  Patient is alert, normal appearance, oriented to person/place/and time. Correctly identified the president of the [place], recall of 3/3 words, and performing simple calculations.  Patient displays appropriate judgement and can read correct time from watch face.  No new identified risk were noted.  No failures at ADL's or IADL's.   BMI- discussed the importance of a healthy diet, water intake and exercise. Educational material provided.   HTN- followed by PCP.  Dental- every six months.    Sleep patterns- Sleeps 7-9 hours at night.  Wakes feeling rested. CPAP in use.  Health maintenance gaps- closed.  Patient Concerns: None at this time. Follow up with PCP as needed.  Hearing/Vision screen Hearing Screening Comments: Patient has some difficulty hearing conversational tones in  a crowd.  Audiology testing deferred per patient request.   Vision Screening Comments: Followed by [facility] Wears corrective lenses for reading Last OV 07/2016 Cataract extraction, bilateral Visual acuity not assessed per patient preference since they have regular follow up with the ophthalmologist  Dietary issues and exercise activities discussed: Current Exercise Habits: Home exercise routine, Type of exercise: treadmill;walking (Physical therapy), Time (Minutes): 50, Frequency (Times/Week): 5, Weekly Exercise (Minutes/Week): 250, Intensity: Mild  Goals    . Increase physical activity          Walk without the use of a cane by staying active and increasing leg strength through exercises (PT, swimming, treadmill)      Depression Screen PHQ 2/9 Scores 08/13/2016  PHQ - 2 Score 0    Fall Risk Fall Risk  08/13/2016  Falls in the past year? No    Cognitive Function: MMSE - Mini Mental State Exam 08/13/2016  Orientation to time 5  Orientation to Place 5  Registration 3  Attention/ Calculation 5  Recall 3  Language- name 2 objects 2  Language- repeat 1  Language- follow 3 step  command 3  Language- read & follow direction 1  Write a sentence 1  Copy design 1  Total score 30        Screening Tests Health Maintenance  Topic Date Due  . INFLUENZA VACCINE  12/10/2016  . MAMMOGRAM  08/05/2018  . TETANUS/TDAP  07/05/2024  . COLONOSCOPY  08/27/2024  . DEXA SCAN  Completed  . Hepatitis C Screening  Completed  . PNA vac Low Risk Adult  Completed      Plan:    End of life planning; Advance aging; Advanced directives discussed. Copy of current HCPOA/Living Will on file.    Medicare Attestation I have personally reviewed: The patient's medical and social history Their use of alcohol, tobacco or illicit drugs Their current medications and supplements The patient's functional ability including ADLs,fall risks, home safety risks, cognitive, and hearing and visual impairment Diet and physical activities Evidence for depression   The patient's weight, height, BMI, and visual acuity have been recorded in the chart.  I have made referrals and provided education to the patient based on review of the above and I have provided the patient with a written personalized care plan for preventive services.    During the course of the visit, Ellen Hamilton was educated and counseled about the following appropriate screening and preventive services:   Vaccines to include Pneumoccal, Influenza, Hepatitis B, Td, Zostavax, HCV  Colorectal cancer screening-UTD  Bone density screening-UTD  Glaucoma screening-annual eye exam  Mammography/Pap-UTD  Nutrition counseling  Patient Instructions (the written plan) were given to the patient.    Varney Biles, LPN   3/0/1601

## 2016-08-14 NOTE — Progress Notes (Signed)
  I have reviewed the above information and agree with above.   Teresa Tullo, MD 

## 2016-08-15 ENCOUNTER — Encounter: Payer: Medicare Other | Admitting: Physical Therapy

## 2016-08-19 ENCOUNTER — Encounter: Payer: Self-pay | Admitting: Physical Therapy

## 2016-08-19 ENCOUNTER — Ambulatory Visit: Payer: Medicare Other | Attending: Internal Medicine | Admitting: Physical Therapy

## 2016-08-19 DIAGNOSIS — R296 Repeated falls: Secondary | ICD-10-CM

## 2016-08-19 DIAGNOSIS — R262 Difficulty in walking, not elsewhere classified: Secondary | ICD-10-CM | POA: Diagnosis not present

## 2016-08-19 IMAGING — RF DG C-ARM 61-120 MIN
1 series · 1 of 1 positions shown · non-contrast
Comparison: Lumbar MRI, 01/27/2014.

CLINICAL DATA: Imaging for L5 kyphoplasty.

EXAM:
DG C-ARM 61-120 MIN; LUMBAR SPINE - 2-3 VIEW

[Series 1: run · 1 of 1 slices shown]
[im 1/1]
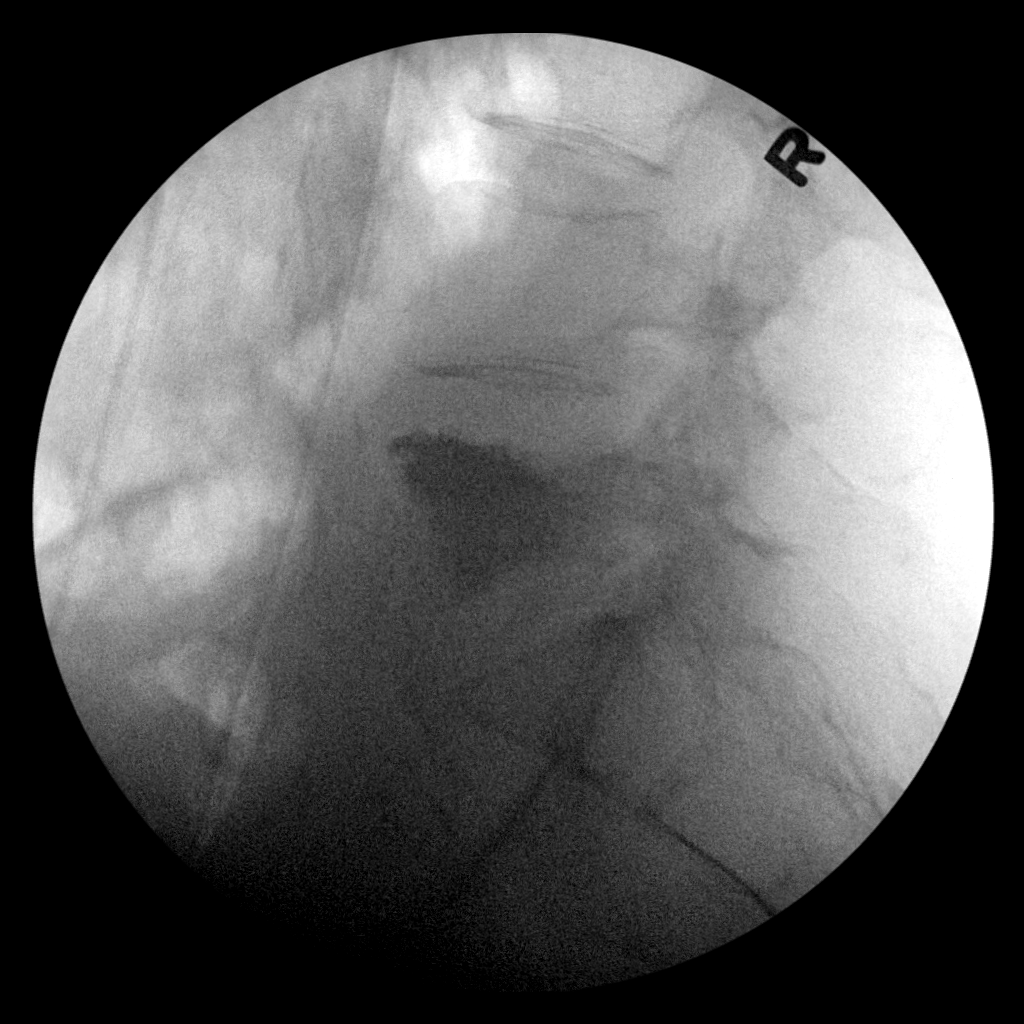

[1 of 1 positions shown; findings below may reference images not displayed]

FINDINGS: Two submitted portable images show kyphoplasty cement within the
collapsed/fractured L5 vertebrae. Cement is well positioned within
the vertebral body. No cement extravasation or evidence of a
procedure complication.
IMPRESSION: Imaging provided during L5 kyphoplasty as detailed.

## 2016-08-19 NOTE — Therapy (Signed)
[place] MAIN [facility] SERVICES [address] [place], [place], [address] Phone: [phone]   Fax:  [phone]  Physical Therapy Treatment  Patient Details  Name: Ellen Hamilton MRN: [id number] Date of Birth: 1950-11-03 Referring Provider: recurrent falls  Encounter Date: 08/19/2016      PT End of Session - 08/19/16 0906    Visit Number 13   Number of Visits 17   Date for PT Re-Evaluation 08/08/16   Authorization Type Needs G codes    Authorization Time Period 13/20   PT Start Time 0900   PT Stop Time 0946   PT Time Calculation (min) 46 min   Equipment Utilized During Treatment Gait belt   Activity Tolerance Patient tolerated treatment well   Behavior During Therapy [facility] for tasks assessed/performed      Past Medical History:  Diagnosis Date  . Compression fracture of L5 lumbar vertebra ([place]) 01/2014  . Fibromyalgia   . GERD (gastroesophageal reflux disease)   . History of transient ischemic attack (TIA) 0737,1062  . Hyperlipidemia   . Hypertension   . Osteoporosis   . Sleep apnea 2009   sleep study done in [place]  . Stroke [facility]) [phone]   pt has had two    Past Surgical History:  Procedure Laterality Date  . BACK SURGERY    . BUNIONECTOMY Bilateral [id number]  . CATARACT EXTRACTION W/ INTRAOCULAR LENS  IMPLANT, BILATERAL  2010  . EYE SURGERY    . kyphoplaasty  2010  . KYPHOPLASTY N/A 02/13/2014   Procedure: KYPHOPLASTY LUMBAR FIVE;  Surgeon: Charlie Pitter, MD;  Location: [facility];  Service: Neurosurgery;  Laterality: N/A;  . LAPAROSCOPIC APPENDECTOMY N/A 01/11/2016   Procedure: APPENDECTOMY LAPAROSCOPIC;  Surgeon: Florene Glen, MD;  Location: [facility] ORS;  Service: General;  Laterality: N/A;    There were no vitals filed for this visit.      Subjective Assessment - 08/19/16 0904    Subjective Patient states she has been trying to alternate between bench exercises and Thera-band exercises. Patient states it  really helps her to do some exercise first thing in the morning.    Patient is accompained by: Family member   Pertinent History Patient states she had her appendix out in September 2017. Patient and her husband state that patient has had a decline in her mobility and balance since the surgery. Patient reports she has had 3-4 falls in the past six months and several near miss falls. Patient states she had a fall in the kitchen when she turned to reach for something. Patient states she cannot do quick turns. Patient states she is afraid of falling and states she feels she does not have the same stability as she did before. Patient feels her gait has deteriorted and is using her cane all the time. They report that the patient is not picking up her feet and catches her left toe instead of heel strike first. Patient exercises daily including going to the pool 5 days a week, doing the Nustep machine for 15-20 minutes daily, treadmill walking 7 minutes and sled. In addition, patient does Theraband exercises and works on high stepping and ladder work. Patient does about 15-20 minutes of these exercises. Patient states she had a kyphoplasty due to a compression fracture from straining using the restroom in August 2015 and the surgery was October 2015. Patient has a history of multiple compression fractures involving T8, T12 and L5 per patient and husband report. Patient  has a history of osteoporosis and patient suffered from TIA in 2008 and stroke in 2009 affecting primarily her right leg. Patient states she had a kyphoplasty due to a compression fracture from straining using the restroom in August 2015 and the surgery was October 2015. Patient has a history of multiple compression fractures involving T8, T12 and L5 per patient and husband report. Patient has a history of osteoporosis and patient suffered from TIA in 2008 and stroke in 2009 affecting primarily her right leg.     Patient Stated Goals patient states she  would like to be able to walk better, be able to turn around better and to have improved balance   Currently in Pain? No/denies      Neuromuscular Re-education:   FUNCTIONAL OUTCOME MEASURES:  Results Comments  LEFS 26.80/100 The lower the score the greater the disability; in need of intervention  ABC Scale 42.5% Falls risk; in need of intervention  TUG 17 sec >14 sec indicates falls risk; in need of intervention  10 meter Walking Speed 0.83 m/sec Below average as compared to age and gender normative values; in need of intervention   Repeated functional outcome measure tests and compared test results to prior testing. Discussed progress towards goals and discussed goals with patient. Patient states that she would like to work on floor transfers and over ground walking using Lite Gait support as she reports her goals is to ambulate without AD. Discussed aquatic therapy to work on an aquatic home exercise program as patient reports that she utilizes the pool at her facility several days a week to exercise.    Biodex treadmill Walking:  Patient ambulated utilizing gait training mode on treadmill for 8 minutes at 2 MPH without UEs support. Patient was able to maintain gait step length and stride 100% of the time. Patient able to maintain good alignment of right foot without ER of lower leg. Patient does have decreased right ankle DF and decreased toe off at initial swing phase as well as decreased toe clearance at mid-swing position most likely residual deficit secondary to prior CVA. Patient did exhibit signs of fatigue during last 1 1/2 minutes of treadmill walking however patient was able to maintain step length bilaterally, but she expressed that she did have one episode of minor toe drag on right foot that she was able to self-correct.     Floor transfers:  Demonstrated how to transfer chair to floor and floor to quadraped to tall kneeling to standing position.  Patient performed transfer  from chair to floor on red mat. Patient the was able to get into quadraped position and move over to low mat table. Patient able to get into tall kneeling position with hands on mat table and bring right foot onto floor. Patient able to push up to standing position using B UEs and R leg with CGA.        PT Education - 08/19/16 0906    Education provided Yes   Education Details Discussed functional outcome testing and compared to prior tests, discussed progress towards goals and POC   Person(s) Educated Patient;Spouse   Methods Explanation   Comprehension Verbalized understanding             PT Long Term Goals - 08/19/16 0906      PT LONG TERM GOAL #1   Title Patient will reduce falls risk as indicated by Activities Specific Balance Confidence Scale (ABC) >67% by 08/08/2016.   Baseline scored 33% on 06/13/2016., 07/15/16:  33.4%; scored 42.5% on 08/19/2016   Time 8   Period Weeks   Status Partially Met     PT LONG TERM GOAL #2   Title Patient will be able to perform home program independently for self-management.   Time 8   Period Weeks   Status Achieved     PT LONG TERM GOAL #3   Title Patient will improve self-selected gait speed to 0.7 m/s or greater on 10 m walk to indicate reduced falls risk, improved community mobility and independence with ADL's.   Baseline average gait speed on 06/13/2016 was 0.59 m/sec, 07/15/16: 0.81 m/s; scored 0.83 m/sec on 08/19/2016   Time 8   Period Weeks   Status Achieved     PT LONG TERM GOAL #4   Title Patient will score less than 14 seconds on the TUG test to indicate reduced falls risk.    Baseline scored 19 sec on 06/13/2016, 07/15/16: 13.8 seconds; scored 17 seconds on 08/19/2016   Time 8   Period Weeks   Status Partially Met     PT LONG TERM GOAL #5   Title Patient will reduce falls risk as indicated by Merrilee Jansky Balance Scale Score of 49/56 or greater.   Baseline scored 42/56 on 06/13/2016; 07/15/16: 51/56   Time 8   Period Weeks   Status Achieved      Additional Long Term Goals   Additional Long Term Goals Yes     PT LONG TERM GOAL #6   Title Patient will have improved score of 9 or more points on teh lower extremilty functional scale in order to improve patients ability to perform ADLs.   Baseline scored 26/80 on 06/16/2016; 07/15/16: 16/80; scored 26/80 on 08/19/2016   Time 8   Period Weeks   Status On-going     PT LONG TERM GOAL #7   Title Patient will reduce falls risk as indicated by Merrilee Jansky Balance Scale Score of 54/56 or greater.   Baseline 07/15/16: 51/56   Time 8   Period Weeks   Status On-going     PT LONG TERM GOAL #8   Title Patient will be independent in aquatic based home exercise program to help maintain patient's functional status and functional independence by 09/30/2016.   Time 6   Period Weeks   Status New               Plan - 08/19/16 0913    Clinical Impression Statement Repeated functional outcome measure testing this date. Patient has met 3 goals, 2 goals are on-going and partially met 2 goals as set on plan of care. Patient reports improvements in her walking, balance and ability to turn around. Patient reports that she would like to practice being able to get up from the floor if she were to suffer a fall. Practiced floor transfer from floor to using assistance of mat table this date but will plan next session to practice floor to stand transfer without equipment. In addition, patient would like to work on gait as she would like to be able to ambulate without SPC. Will plan on trying to use Lite Gait system to work on over ground walking with and without AD next visit. Patient might benefit from transition to aquatic therapy to provide patient with an aquatic home exercise program as patient currently has a pool where she lives and utilizes the pool for exercise. Patient would benefit from continued PT services to address revised goals.    Rehab Potential Fair  Clinical Impairments Affecting Rehab Potential  Positive indicators: motivated, family support     Negative indicators: prior CVA with residual deficits primarily affecting right LE   PT Frequency 2x / week   PT Duration 6 weeks   PT Treatment/Interventions Balance training;Therapeutic exercise;Therapeutic activities;Gait training;Stair training;Neuromuscular re-education;Patient/family education;Functional mobility training;Aquatic Therapy;Manual techniques   PT Next Visit Plan floor to stand transfers; ambulation with LiteGait with and without SPC   PT Home Exercise Plan sidelying clam shells, bridging, SLR (start with R knee flexed until she can perform through full range), slow marching with faded UEs assist, reverse clam shells with theraband resistance, seated theraband resisted hip abd,add and flexion. In the pool working on quick alternating high knee marching and simulated "kick ball" focusing on speed and strength of kicking. working on right/ left turning.   Consulted and Agree with Plan of Care Patient;Family member/caregiver   Family Member Consulted husband- Mayvis Agudelo      Patient will benefit from skilled therapeutic intervention in order to improve the following deficits and impairments:  Decreased balance, Difficulty walking, Decreased strength, Decreased range of motion, Decreased mobility, Pain  Visit Diagnosis: Difficulty in walking, not elsewhere classified  Repeated falls     Problem List Patient Active Problem List   Diagnosis Date Noted  . Influenza A 05/27/2016  . Acne rosacea 01/15/2016  . Colonic diverticular disease 01/15/2016  . Obesity 01/15/2016  . S/P appendectomy   . Acne erythematosa 10/18/2015  . Adiposity 10/18/2015  . Allergic rhinitis 10/18/2015  . Cerebrovascular accident (CVA) ([place]) 10/18/2015  . Compression fracture of thoracic vertebra ([place]) 10/18/2015  . Diverticulosis of large intestine without perforation or abscess without bleeding 10/18/2015  . Esophagitis 10/18/2015  .  Fibromyalgia 10/18/2015  . Candidiasis of breast 04/28/2015  . Candida glabrata infection 04/28/2015  . Long-term use of high-risk medication 04/13/2014  . History of high risk medication treatment 04/13/2014  . Lumbar compression fracture ([place]) 02/13/2014  . Compression fracture of L5 lumbar vertebra ([place]) 01/25/2014  . Compression fracture of lumbar vertebra ([place]) 01/25/2014  . Medicare annual wellness visit, subsequent 09/28/2012  . Encounter for general adult medical examination without abnormal findings 09/28/2012  . History of thyroid nodule 06/13/2012  . Benign colonic polyp 06/13/2012  . History of thyroid disease 06/13/2012  . OSA on CPAP 06/06/2012  . Cerebrovascular disease 06/04/2012  . Hypertension 06/04/2012  . Hyperlipidemia 06/04/2012  . Osteoporosis 06/04/2012   Lady Deutscher PT, DPT Lady Deutscher 08/19/2016, 11:03 AM  [place] MAIN [facility] SERVICES [address] [place], [place], [address] Phone: [phone]   Fax:  [phone]  Name: Ellen Hamilton MRN: [id number] Date of Birth: 1950-11-16

## 2016-08-21 ENCOUNTER — Ambulatory Visit: Payer: Medicare Other | Admitting: Physical Therapy

## 2016-08-22 ENCOUNTER — Ambulatory Visit: Payer: Medicare Other | Admitting: Physical Therapy

## 2016-08-22 ENCOUNTER — Encounter: Payer: Self-pay | Admitting: Physical Therapy

## 2016-08-22 DIAGNOSIS — R262 Difficulty in walking, not elsewhere classified: Secondary | ICD-10-CM

## 2016-08-22 DIAGNOSIS — R296 Repeated falls: Secondary | ICD-10-CM | POA: Diagnosis not present

## 2016-08-22 NOTE — Therapy (Signed)
[place] MAIN [facility] SERVICES [address] [place], [place], [address] Phone: [phone]   Fax:  [phone]  Physical Therapy Treatment  Patient Details  Name: Ellen Hamilton MRN: [id number] Date of Birth: 1950-06-08 Referring Provider: recurrent falls  Encounter Date: 08/22/2016      PT End of Session - 08/22/16 1243    Visit Number 14   Number of Visits 17   Date for PT Re-Evaluation 09/30/16   Authorization Type Needs G codes    Authorization Time Period 14/20   PT Start Time 1116   PT Stop Time 1202   PT Time Calculation (min) 46 min   Equipment Utilized During Treatment --   Activity Tolerance Patient tolerated treatment well   Behavior During Therapy [facility] for tasks assessed/performed      Past Medical History:  Diagnosis Date  . Compression fracture of L5 lumbar vertebra ([place]) 01/2014  . Fibromyalgia   . GERD (gastroesophageal reflux disease)   . History of transient ischemic attack (TIA) 7628,3151  . Hyperlipidemia   . Hypertension   . Osteoporosis   . Sleep apnea 2009   sleep study done in [place]  . Stroke [facility]) [phone]   pt has had two    Past Surgical History:  Procedure Laterality Date  . BACK SURGERY    . BUNIONECTOMY Bilateral [id number]  . CATARACT EXTRACTION W/ INTRAOCULAR LENS  IMPLANT, BILATERAL  2010  . EYE SURGERY    . kyphoplaasty  2010  . KYPHOPLASTY N/A 02/13/2014   Procedure: KYPHOPLASTY LUMBAR FIVE;  Surgeon: Charlie Pitter, MD;  Location: [place] NEURO ORS;  Service: Neurosurgery;  Laterality: N/A;  . LAPAROSCOPIC APPENDECTOMY N/A 01/11/2016   Procedure: APPENDECTOMY LAPAROSCOPIC;  Surgeon: Florene Glen, MD;  Location: [facility] ORS;  Service: General;  Laterality: N/A;    There were no vitals filed for this visit.      Subjective Assessment - 08/22/16 1120    Subjective Patient states her left side is sore from doing her home exercise program when she lies on a work out bench. (Discusssed  using a towel for cushioning.)    Patient is accompained by: Family member   Pertinent History Patient states she had her appendix out in September 2017. Patient and her husband state that patient has had a decline in her mobility and balance since the surgery. Patient reports she has had 3-4 falls in the past six months and several near miss falls. Patient states she had a fall in the kitchen when she turned to reach for something. Patient states she cannot do quick turns. Patient states she is afraid of falling and states she feels she does not have the same stability as she did before. Patient feels her gait has deteriorted and is using her cane all the time. They report that the patient is not picking up her feet and catches her left toe instead of heel strike first. Patient exercises daily including going to the pool 5 days a week, doing the Nustep machine for 15-20 minutes daily, treadmill walking 7 minutes and sled. In addition, patient does Theraband exercises and works on high stepping and ladder work. Patient does about 15-20 minutes of these exercises. Patient states she had a kyphoplasty due to a compression fracture from straining using the restroom in August 2015 and the surgery was October 2015. Patient has a history of multiple compression fractures involving T8, T12 and L5 per patient and husband report. Patient has a history  of osteoporosis and patient suffered from TIA in 2008 and stroke in 2009 affecting primarily her right leg. Patient states she had a kyphoplasty due to a compression fracture from straining using the restroom in August 2015 and the surgery was October 2015. Patient has a history of multiple compression fractures involving T8, T12 and L5 per patient and husband report. Patient has a history of osteoporosis and patient suffered from TIA in 2008 and stroke in 2009 affecting primarily her right leg.     Patient Stated Goals patient states she would like to be able to walk  better, be able to turn around better and to have improved balance      Neuromuscular Re-education:  LiteGait ambulation:  Patient performed repeated >200' trials of ambulation over ground utilizing LiteGait initially holding support bar with one hand. Then, worked on trials with patient utilizing her [facility] and finally trials using no AD. Patient required verbal cuing to not externally rotate lower leg, to maintain step length and for heel contact with ambulation without AD. Patient required one seated rest break and 2 short standing rest breaks secondary to fatigue. Patient ambulated at self-selected speed and at increased speed as patient was better able to consistently maintain step length at the higher gait speeds. Patient had no losses of balance or episodes of right toe drag. When utilizing [place], patient was able to maintain relatively equal step lengths with step through gait pattern over 95% of the time. However, when patient ambulated without AD, noted slight decrease in step length in the right side and patient was able to maintain full step through gait pattern about 80-85% of the time.        PT Education - 08/22/16 1243    Education provided Yes   Education Details Gait training   Person(s) Educated Patient;Spouse   Methods Explanation;Demonstration;Verbal cues   Comprehension Verbalized understanding             PT Long Term Goals - 08/19/16 0906      PT LONG TERM GOAL #1   Title Patient will reduce falls risk as indicated by Activities Specific Balance Confidence Scale (ABC) >67% by 08/08/2016.   Baseline scored 33% on 06/13/2016., 07/15/16: 33.4%; scored 42.5% on 08/19/2016   Time 8   Period Weeks   Status Partially Met     PT LONG TERM GOAL #2   Title Patient will be able to perform home program independently for self-management.   Time 8   Period Weeks   Status Achieved     PT LONG TERM GOAL #3   Title Patient will improve self-selected gait speed to 0.7 m/s or  greater on 10 m walk to indicate reduced falls risk, improved community mobility and independence with ADL's.   Baseline average gait speed on 06/13/2016 was 0.59 m/sec, 07/15/16: 0.81 m/s; scored 0.83 m/sec on 08/19/2016   Time 8   Period Weeks   Status Achieved     PT LONG TERM GOAL #4   Title Patient will score less than 14 seconds on the TUG test to indicate reduced falls risk.    Baseline scored 19 sec on 06/13/2016, 07/15/16: 13.8 seconds; scored 17 seconds on 08/19/2016   Time 8   Period Weeks   Status Partially Met     PT LONG TERM GOAL #5   Title Patient will reduce falls risk as indicated by Merrilee Jansky Balance Scale Score of 49/56 or greater.   Baseline scored 42/56 on 06/13/2016; 07/15/16: 51/56  Time 8   Period Weeks   Status Achieved     Additional Long Term Goals   Additional Long Term Goals Yes     PT LONG TERM GOAL #6   Title Patient will have improved score of 9 or more points on teh lower extremilty functional scale in order to improve patients ability to perform ADLs.   Baseline scored 26/80 on 06/16/2016; 07/15/16: 16/80; scored 26/80 on 08/19/2016   Time 8   Period Weeks   Status On-going     PT LONG TERM GOAL #7   Title Patient will reduce falls risk as indicated by Merrilee Jansky Balance Scale Score of 54/56 or greater.   Baseline 07/15/16: 51/56   Time 8   Period Weeks   Status On-going     PT LONG TERM GOAL #8   Title Patient will be independent in aquatic based home exercise program to help maintain patient's functional status and functional independence by 09/30/2016.   Time 6   Period Weeks   Status New               Plan - 08/22/16 1244    Clinical Impression Statement Patient worked on ambulation this date utilizing the [place] machine so that patient could work on ambulating with and without AD and at varying gait speeds. Patient declined to try to use a trekking pole. Patient did well ambulating with SPC with step through gait pattern demonstrating good heel strike  and foot clearance on right side with minimal ER of foot. When ambulating without AD using LiteGait, patient was noted to have more inconsistency with maintaining step through gait pattern and noted decreased heel strike at initial contact as well as increased right foot/lower leg ER which patient was able to improve these components with cuing and practice. Patient would benefit from additional practice with LiteGait to work on gait training. Encouraged patient to follow-up as indicated.    Rehab Potential Fair   Clinical Impairments Affecting Rehab Potential Positive indicators: motivated, family support     Negative indicators: prior CVA with residual deficits primarily affecting right LE   PT Frequency 2x / week   PT Duration 6 weeks   PT Treatment/Interventions Balance training;Therapeutic exercise;Therapeutic activities;Gait training;Stair training;Neuromuscular re-education;Patient/family education;Functional mobility training;Aquatic Therapy;Manual techniques   PT Next Visit Plan floor to stand transfers; ambulation with LiteGait with and without SPC and repeat Berg balance test   PT Home Exercise Plan sidelying clam shells, bridging, SLR (start with R knee flexed until she can perform through full range), slow marching with faded UEs assist, reverse clam shells with theraband resistance, seated theraband resisted hip abd,add and flexion. In the pool working on quick alternating high knee marching and simulated "kick ball" focusing on speed and strength of kicking. working on right/ left turning.   Consulted and Agree with Plan of Care Patient;Family member/caregiver   Family Member Consulted husband- Sheketa Ende      Patient will benefit from skilled therapeutic intervention in order to improve the following deficits and impairments:  Decreased balance, Difficulty walking, Decreased strength, Decreased range of motion, Decreased mobility, Pain  Visit Diagnosis: Difficulty in walking, not  elsewhere classified  Repeated falls     Problem List Patient Active Problem List   Diagnosis Date Noted  . Influenza A 05/27/2016  . Acne rosacea 01/15/2016  . Colonic diverticular disease 01/15/2016  . Obesity 01/15/2016  . S/P appendectomy   . Acne erythematosa 10/18/2015  . Adiposity 10/18/2015  . Allergic  rhinitis 10/18/2015  . Cerebrovascular accident (CVA) ([place]) 10/18/2015  . Compression fracture of thoracic vertebra ([place]) 10/18/2015  . Diverticulosis of large intestine without perforation or abscess without bleeding 10/18/2015  . Esophagitis 10/18/2015  . Fibromyalgia 10/18/2015  . Candidiasis of breast 04/28/2015  . Candida glabrata infection 04/28/2015  . Long-term use of high-risk medication 04/13/2014  . History of high risk medication treatment 04/13/2014  . Lumbar compression fracture ([place]) 02/13/2014  . Compression fracture of L5 lumbar vertebra ([place]) 01/25/2014  . Compression fracture of lumbar vertebra ([place]) 01/25/2014  . Medicare annual wellness visit, subsequent 09/28/2012  . Encounter for general adult medical examination without abnormal findings 09/28/2012  . History of thyroid nodule 06/13/2012  . Benign colonic polyp 06/13/2012  . History of thyroid disease 06/13/2012  . OSA on CPAP 06/06/2012  . Cerebrovascular disease 06/04/2012  . Hypertension 06/04/2012  . Hyperlipidemia 06/04/2012  . Osteoporosis 06/04/2012   Lady Deutscher PT, DPT Lady Deutscher 08/22/2016, 1:03 PM  [place] MAIN [facility] SERVICES [address] [place], [place], [address] Phone: [phone]   Fax:  [phone]  Name: Ellen Hamilton MRN: [id number] Date of Birth: 11/24/1950

## 2016-08-26 ENCOUNTER — Encounter: Payer: Self-pay | Admitting: Internal Medicine

## 2016-08-26 ENCOUNTER — Ambulatory Visit: Payer: Medicare Other | Admitting: Physical Therapy

## 2016-08-26 ENCOUNTER — Encounter: Payer: Self-pay | Admitting: Physical Therapy

## 2016-08-26 DIAGNOSIS — R296 Repeated falls: Secondary | ICD-10-CM | POA: Diagnosis not present

## 2016-08-26 DIAGNOSIS — R262 Difficulty in walking, not elsewhere classified: Secondary | ICD-10-CM | POA: Diagnosis not present

## 2016-08-26 NOTE — Therapy (Signed)
[place] MAIN [facility] SERVICES [address] [place], [place], [address] Phone: [phone]   Fax:  [phone]  Physical Therapy Treatment  Patient Details  Name: Ellen Hamilton MRN: [id number] Date of Birth: 01/27/1951 Referring Provider: recurrent falls  Encounter Date: 08/26/2016      PT End of Session - 08/26/16 0904    Visit Number 15   Number of Visits 17   Date for PT Re-Evaluation 09/30/16   Authorization Type Needs G codes    Authorization Time Period 15/20   PT Start Time 0900   PT Stop Time 0954   PT Time Calculation (min) 54 min   Equipment Utilized During Treatment Gait belt   Activity Tolerance Patient tolerated treatment well   Behavior During Therapy [facility] for tasks assessed/performed      Past Medical History:  Diagnosis Date  . Compression fracture of L5 lumbar vertebra ([place]) 01/2014  . Fibromyalgia   . GERD (gastroesophageal reflux disease)   . History of transient ischemic attack (TIA) 7517,0017  . Hyperlipidemia   . Hypertension   . Osteoporosis   . Sleep apnea 2009   sleep study done in [place]  . Stroke [facility]) [phone]   pt has had two    Past Surgical History:  Procedure Laterality Date  . BACK SURGERY    . BUNIONECTOMY Bilateral [id number]  . CATARACT EXTRACTION W/ INTRAOCULAR LENS  IMPLANT, BILATERAL  2010  . EYE SURGERY    . kyphoplaasty  2010  . KYPHOPLASTY N/A 02/13/2014   Procedure: KYPHOPLASTY LUMBAR FIVE;  Surgeon: Charlie Pitter, MD;  Location: [place] NEURO ORS;  Service: Neurosurgery;  Laterality: N/A;  . LAPAROSCOPIC APPENDECTOMY N/A 01/11/2016   Procedure: APPENDECTOMY LAPAROSCOPIC;  Surgeon: Florene Glen, MD;  Location: [facility] ORS;  Service: General;  Laterality: N/A;    There were no vitals filed for this visit.      Subjective Assessment - 08/26/16 0904    Subjective Patient states her left side is still sore and that she has been taking over the counter ibuprofen.    Patient is  accompained by: Family member   Pertinent History Patient states she had her appendix out in September 2017. Patient and her husband state that patient has had a decline in her mobility and balance since the surgery. Patient reports she has had 3-4 falls in the past six months and several near miss falls. Patient states she had a fall in the kitchen when she turned to reach for something. Patient states she cannot do quick turns. Patient states she is afraid of falling and states she feels she does not have the same stability as she did before. Patient feels her gait has deteriorted and is using her cane all the time. They report that the patient is not picking up her feet and catches her left toe instead of heel strike first. Patient exercises daily including going to the pool 5 days a week, doing the Nustep machine for 15-20 minutes daily, treadmill walking 7 minutes and sled. In addition, patient does Theraband exercises and works on high stepping and ladder work. Patient does about 15-20 minutes of these exercises. Patient states she had a kyphoplasty due to a compression fracture from straining using the restroom in August 2015 and the surgery was October 2015. Patient has a history of multiple compression fractures involving T8, T12 and L5 per patient and husband report. Patient has a history of osteoporosis and patient suffered from TIA in  2008 and stroke in 2009 affecting primarily her right leg. Patient states she had a kyphoplasty due to a compression fracture from straining using the restroom in August 2015 and the surgery was October 2015. Patient has a history of multiple compression fractures involving T8, T12 and L5 per patient and husband report. Patient has a history of osteoporosis and patient suffered from TIA in 2008 and stroke in 2009 affecting primarily her right leg.     Patient Stated Goals patient states she would like to be able to walk better, be able to turn around better and to have  improved balance   Currently in Pain? No/denies      Neuromuscular Re-education:  Floor transfers:  Demonstrated how to transfer chair to floor and floor to quadraped to standing position without use of equipment, support surface (furniture) or AD.  Patient performed transfer from chair to floor on red mat. Patient then was able to get into quadraped position and place her right foot flat on the red mat. She was then able to push herself up into a standing position using her hands as need to push off from the floor with CGA. Patient needed a verbal cue when she first came into standing position as her feet were too far apart with her right LE externally rotated; therefore, cue patient to straighten her left leg and to shift her weight over left support leg. Then, patient was able to bring her right leg into neutral position and to slide her left leg into more adducted position to be able to attain normal base of support.   Ambulation up/down inclines surface: Reviewed techniques with patient on how to ascend/descend ramped/inclined surface with use of SPC. Patient then performed multiple reps (approximately 2 sets of 4 reps) of descending/ascending inclined carpeted surface while using SPC. Patient demonstrated good technique but needed vc with ascending ramp to utilize [facility] to push off and to straighten left knee and squeeze left gluts in order to better be able to initiate swing phase on right leg and to clear right foot in swing phase. Patient improved with practice using this technique. In addition, patient widened her base of support by moving SPC slightly further out which helped provide additional stability. Patient was able to maintain good, safe speed with ambulating up/down incline surface.          PT Education - 08/26/16 0904    Education provided Yes   Education Details Reviewed safety with ambulating up/down inclined surfaces; educated as to how to perform floor to standing transfer  without use of equipment   Person(s) Educated Patient;Spouse   Methods Explanation;Demonstration;Verbal cues   Comprehension Verbalized understanding             PT Long Term Goals - 08/19/16 0906      PT LONG TERM GOAL #1   Title Patient will reduce falls risk as indicated by Activities Specific Balance Confidence Scale (ABC) >67% by 08/08/2016.   Baseline scored 33% on 06/13/2016., 07/15/16: 33.4%; scored 42.5% on 08/19/2016   Time 8   Period Weeks   Status Partially Met     PT LONG TERM GOAL #2   Title Patient will be able to perform home program independently for self-management.   Time 8   Period Weeks   Status Achieved     PT LONG TERM GOAL #3   Title Patient will improve self-selected gait speed to 0.7 m/s or greater on 10 m walk to indicate reduced falls risk,  improved community mobility and independence with ADL's.   Baseline average gait speed on 06/13/2016 was 0.59 m/sec, 07/15/16: 0.81 m/s; scored 0.83 m/sec on 08/19/2016   Time 8   Period Weeks   Status Achieved     PT LONG TERM GOAL #4   Title Patient will score less than 14 seconds on the TUG test to indicate reduced falls risk.    Baseline scored 19 sec on 06/13/2016, 07/15/16: 13.8 seconds; scored 17 seconds on 08/19/2016   Time 8   Period Weeks   Status Partially Met     PT LONG TERM GOAL #5   Title Patient will reduce falls risk as indicated by Merrilee Jansky Balance Scale Score of 49/56 or greater.   Baseline scored 42/56 on 06/13/2016; 07/15/16: 51/56   Time 8   Period Weeks   Status Achieved     Additional Long Term Goals   Additional Long Term Goals Yes     PT LONG TERM GOAL #6   Title Patient will have improved score of 9 or more points on teh lower extremilty functional scale in order to improve patients ability to perform ADLs.   Baseline scored 26/80 on 06/16/2016; 07/15/16: 16/80; scored 26/80 on 08/19/2016   Time 8   Period Weeks   Status On-going     PT LONG TERM GOAL #7   Title Patient will reduce falls risk as  indicated by Merrilee Jansky Balance Scale Score of 54/56 or greater.   Baseline 07/15/16: 51/56   Time 8   Period Weeks   Status On-going     PT LONG TERM GOAL #8   Title Patient will be independent in aquatic based home exercise program to help maintain patient's functional status and functional independence by 09/30/2016.   Time 6   Period Weeks   Status New               Plan - 08/26/16 0904    Clinical Impression Statement Patient worked on ambulating up/down inclines with use of SPC. Patient required min cuing for technique and was able to demonstrate ambulation up/down incline with SPC with supervision without LOB. Patient did well with performing floor to standing transfer without use of equipment or support surface. Patient would benefit from continued PT services to further address goals and to reduce patient's falls risk.    Rehab Potential Fair   Clinical Impairments Affecting Rehab Potential Positive indicators: motivated, family support     Negative indicators: prior CVA with residual deficits primarily affecting right LE   PT Frequency 2x / week   PT Duration 6 weeks   PT Treatment/Interventions Balance training;Therapeutic exercise;Therapeutic activities;Gait training;Stair training;Neuromuscular re-education;Patient/family education;Functional mobility training;Aquatic Therapy;Manual techniques   PT Next Visit Plan obstacle course with varying surfaces and adding in turning as called out by therapist; floor ladder in // bars working on unanticipated movements   PT Home Exercise Plan sidelying clam shells, bridging, SLR (start with R knee flexed until she can perform through full range), slow marching with faded UEs assist, reverse clam shells with theraband resistance, seated theraband resisted hip abd,add and flexion. In the pool working on quick alternating high knee marching and simulated "kick ball" focusing on speed and strength of kicking. working on right/ left turning.    Consulted and Agree with Plan of Care Patient;Family member/caregiver   Family Member Consulted husband- Alaiza Yau      Patient will benefit from skilled therapeutic intervention in order to improve the following deficits and impairments:  Decreased balance, Difficulty walking, Decreased strength, Decreased range of motion, Decreased mobility, Pain  Visit Diagnosis: Difficulty in walking, not elsewhere classified  Repeated falls     Problem List Patient Active Problem List   Diagnosis Date Noted  . Influenza A 05/27/2016  . Acne rosacea 01/15/2016  . Colonic diverticular disease 01/15/2016  . Obesity 01/15/2016  . S/P appendectomy   . Acne erythematosa 10/18/2015  . Adiposity 10/18/2015  . Allergic rhinitis 10/18/2015  . Cerebrovascular accident (CVA) ([place]) 10/18/2015  . Compression fracture of thoracic vertebra ([place]) 10/18/2015  . Diverticulosis of large intestine without perforation or abscess without bleeding 10/18/2015  . Esophagitis 10/18/2015  . Fibromyalgia 10/18/2015  . Candidiasis of breast 04/28/2015  . Candida glabrata infection 04/28/2015  . Long-term use of high-risk medication 04/13/2014  . History of high risk medication treatment 04/13/2014  . Lumbar compression fracture ([place]) 02/13/2014  . Compression fracture of L5 lumbar vertebra ([place]) 01/25/2014  . Compression fracture of lumbar vertebra ([place]) 01/25/2014  . Medicare annual wellness visit, subsequent 09/28/2012  . Encounter for general adult medical examination without abnormal findings 09/28/2012  . History of thyroid nodule 06/13/2012  . Benign colonic polyp 06/13/2012  . History of thyroid disease 06/13/2012  . OSA on CPAP 06/06/2012  . Cerebrovascular disease 06/04/2012  . Hypertension 06/04/2012  . Hyperlipidemia 06/04/2012  . Osteoporosis 06/04/2012     Lady Deutscher PT, DPT Lady Deutscher 08/26/2016, 11:05 AM  [place] MAIN [facility] SERVICES [address] [place], [place], [address] Phone: [phone]   Fax:  [phone]  Name: Malasia Torain MRN: [id number] Date of Birth: Jul 26, 1950

## 2016-08-29 ENCOUNTER — Ambulatory Visit: Payer: Medicare Other | Admitting: Physical Therapy

## 2016-08-29 ENCOUNTER — Encounter: Payer: Self-pay | Admitting: Physical Therapy

## 2016-08-29 DIAGNOSIS — R296 Repeated falls: Secondary | ICD-10-CM | POA: Diagnosis not present

## 2016-08-29 DIAGNOSIS — R262 Difficulty in walking, not elsewhere classified: Secondary | ICD-10-CM

## 2016-08-29 NOTE — Therapy (Signed)
[place] MAIN [facility] SERVICES [address] [place], [place], [address] Phone: [phone]   Fax:  [phone]  Physical Therapy Treatment  Patient Details  Name: Ellen Hamilton MRN: [id number] Date of Birth: 05-Mar-1951 Referring Provider: recurrent falls  Encounter Date: 08/29/2016      PT End of Session - 08/29/16 1125    Visit Number 16   Number of Visits 17   Date for PT Re-Evaluation 09/30/16   Authorization Type Needs G codes    Authorization Time Period 15/20   PT Start Time 1116   PT Stop Time 1200   PT Time Calculation (min) 44 min   Equipment Utilized During Treatment Gait belt   Activity Tolerance Patient tolerated treatment well   Behavior During Therapy WFL for tasks assessed/performed      Past Medical History:  Diagnosis Date  . Compression fracture of L5 lumbar vertebra ([place]) 01/2014  . Fibromyalgia   . GERD (gastroesophageal reflux disease)   . History of transient ischemic attack (TIA) 8315,1761  . Hyperlipidemia   . Hypertension   . Osteoporosis   . Sleep apnea 2009   sleep study done in [place]  . Stroke [facility]) [id number]   pt has had two    Past Surgical History:  Procedure Laterality Date  . BACK SURGERY    . BUNIONECTOMY Bilateral [id number]  . CATARACT EXTRACTION W/ INTRAOCULAR LENS  IMPLANT, BILATERAL  2010  . EYE SURGERY    . kyphoplaasty  2010  . KYPHOPLASTY N/A 02/13/2014   Procedure: KYPHOPLASTY LUMBAR FIVE;  Surgeon: Charlie Pitter, MD;  Location: [place] NEURO ORS;  Service: Neurosurgery;  Laterality: N/A;  . LAPAROSCOPIC APPENDECTOMY N/A 01/11/2016   Procedure: APPENDECTOMY LAPAROSCOPIC;  Surgeon: Florene Glen, MD;  Location: [facility] ORS;  Service: General;  Laterality: N/A;    There were no vitals filed for this visit.      Subjective Assessment - 08/29/16 1121    Subjective Patient states she is going to stop the work-out bench exercises until her side stops hurting. Discussed doing the leg  exercises seated wit Theraband or manual resistance.    Patient is accompained by: Family member   Pertinent History Patient states she had her appendix out in September 2017. Patient and her husband state that patient has had a decline in her mobility and balance since the surgery. Patient reports she has had 3-4 falls in the past six months and several near miss falls. Patient states she had a fall in the kitchen when she turned to reach for something. Patient states she cannot do quick turns. Patient states she is afraid of falling and states she feels she does not have the same stability as she did before. Patient feels her gait has deteriorted and is using her cane all the time. They report that the patient is not picking up her feet and catches her left toe instead of heel strike first. Patient exercises daily including going to the pool 5 days a week, doing the Nustep machine for 15-20 minutes daily, treadmill walking 7 minutes and sled. In addition, patient does Theraband exercises and works on high stepping and ladder work. Patient does about 15-20 minutes of these exercises. Patient states she had a kyphoplasty due to a compression fracture from straining using the restroom in August 2015 and the surgery was October 2015. Patient has a history of multiple compression fractures involving T8, T12 and L5 per patient and husband report. Patient has a  history of osteoporosis and patient suffered from TIA in 2008 and stroke in 2009 affecting primarily her right leg. Patient states she had a kyphoplasty due to a compression fracture from straining using the restroom in August 2015 and the surgery was October 2015. Patient has a history of multiple compression fractures involving T8, T12 and L5 per patient and husband report. Patient has a history of osteoporosis and patient suffered from TIA in 2008 and stroke in 2009 affecting primarily her right leg.     Patient Stated Goals patient states she would like to  be able to walk better, be able to turn around better and to have improved balance   Currently in Pain? Yes   Pain Location Flank   Pain Orientation Right   Pain Descriptors / Indicators Sore   Pain Type Acute pain      Neuromuscular Re-education:  Obstacle Course: Patient performed obstacle course multiple reps which included the following activities: navigating over balance pods, slide board, foam bricks and stepping onto and over foam pads Airex pad. Patient did well navigating on the course and was most challenged by adding in random turns as called out by therapist as well as by trying to step over the balance pods. She did need to adjust her gait and slow down to adjust her to be able to step over an object. Patient used [facility] during this activity and required CGA.  Floor Ladder: Patient performed floor ladder activity using [place] working on forward ambulation and then sidestepping out either to the left or to the right as called out by therapist with CGA. Then, added in taking backwards steps as called out by therapist.  Patient worked on sidestepping left and right using SPC. Patient did well with this activity demonstrating good step length and speed with CGA.    Speed/Agility Tasks: In // bars with bilateral UEs holding for support as needed, patient worked on step tapping forward, return to midline and backwards multiple reps of 5 times each alternating right and left legs after 5 reps each. Then, added step tapping forward, out to the side, backwards with return to starting position multiple reps of 5 times each alternating right and left legs after 5 reps each.  Then, performed alternating left and right leg after each rep of forward and backward step taps.  Patient worked on trying to increase speed during each subsequent rep of above activities.        PT Education - 08/29/16 1125    Education provided Yes   Education Details Reviewed plan of care   Person(s) Educated  Patient;Spouse   Methods Explanation   Comprehension Verbalized understanding             PT Long Term Goals - 08/19/16 0906      PT LONG TERM GOAL #1   Title Patient will reduce falls risk as indicated by Activities Specific Balance Confidence Scale (ABC) >67% by 08/08/2016.   Baseline scored 33% on 06/13/2016., 07/15/16: 33.4%; scored 42.5% on 08/19/2016   Time 8   Period Weeks   Status Partially Met     PT LONG TERM GOAL #2   Title Patient will be able to perform home program independently for self-management.   Time 8   Period Weeks   Status Achieved     PT LONG TERM GOAL #3   Title Patient will improve self-selected gait speed to 0.7 m/s or greater on 10 m walk to indicate reduced falls risk, improved community  mobility and independence with ADL's.   Baseline average gait speed on 06/13/2016 was 0.59 m/sec, 07/15/16: 0.81 m/s; scored 0.83 m/sec on 08/19/2016   Time 8   Period Weeks   Status Achieved     PT LONG TERM GOAL #4   Title Patient will score less than 14 seconds on the TUG test to indicate reduced falls risk.    Baseline scored 19 sec on 06/13/2016, 07/15/16: 13.8 seconds; scored 17 seconds on 08/19/2016   Time 8   Period Weeks   Status Partially Met     PT LONG TERM GOAL #5   Title Patient will reduce falls risk as indicated by Merrilee Jansky Balance Scale Score of 49/56 or greater.   Baseline scored 42/56 on 06/13/2016; 07/15/16: 51/56   Time 8   Period Weeks   Status Achieved     Additional Long Term Goals   Additional Long Term Goals Yes     PT LONG TERM GOAL #6   Title Patient will have improved score of 9 or more points on teh lower extremilty functional scale in order to improve patients ability to perform ADLs.   Baseline scored 26/80 on 06/16/2016; 07/15/16: 16/80; scored 26/80 on 08/19/2016   Time 8   Period Weeks   Status On-going     PT LONG TERM GOAL #7   Title Patient will reduce falls risk as indicated by Merrilee Jansky Balance Scale Score of 54/56 or greater.   Baseline  07/15/16: 51/56   Time 8   Period Weeks   Status On-going     PT LONG TERM GOAL #8   Title Patient will be independent in aquatic based home exercise program to help maintain patient's functional status and functional independence by 09/30/2016.   Time 6   Period Weeks   Status New               Plan - 08/29/16 1126    Clinical Impression Statement Patient worked on obstacle course with and without turns as called out by therapists and floor ladder activity. Patient also worked on [employer] in // bars. Patient did well with being better able to turn on demand as well as with being able to navigate onto and over a variety of surfaces and objects with the obstacle course. Will plan on working on progressions of obstacle course and speed drills next visit.    Rehab Potential Fair   Clinical Impairments Affecting Rehab Potential Positive indicators: motivated, family support     Negative indicators: prior CVA with residual deficits primarily affecting right LE   PT Frequency 2x / week   PT Duration 6 weeks   PT Treatment/Interventions Balance training;Therapeutic exercise;Therapeutic activities;Gait training;Stair training;Neuromuscular re-education;Patient/family education;Functional mobility training;Aquatic Therapy;Manual techniques   PT Next Visit Plan obstacle course with varying surfaces and adding in turning as called out by therapist; floor ladder in // bars working on unanticipated movements   PT Home Exercise Plan sidelying clam shells, bridging, SLR (start with R knee flexed until she can perform through full range), slow marching with faded UEs assist, reverse clam shells with theraband resistance, seated theraband resisted hip abd,add and flexion. In the pool working on quick alternating high knee marching and simulated "kick ball" focusing on speed and strength of kicking. working on right/ left turning.   Consulted and Agree with Plan of Care Patient;Family  member/caregiver   Family Member Consulted husband- Dorothe Elmore      Patient will benefit from skilled therapeutic intervention  in order to improve the following deficits and impairments:  Decreased balance, Difficulty walking, Decreased strength, Decreased range of motion, Decreased mobility, Pain  Visit Diagnosis: Difficulty in walking, not elsewhere classified  Repeated falls     Problem List Patient Active Problem List   Diagnosis Date Noted  . Influenza A 05/27/2016  . Acne rosacea 01/15/2016  . Colonic diverticular disease 01/15/2016  . Obesity 01/15/2016  . S/P appendectomy   . Acne erythematosa 10/18/2015  . Adiposity 10/18/2015  . Allergic rhinitis 10/18/2015  . Cerebrovascular accident (CVA) ([place]) 10/18/2015  . Compression fracture of thoracic vertebra ([place]) 10/18/2015  . Diverticulosis of large intestine without perforation or abscess without bleeding 10/18/2015  . Esophagitis 10/18/2015  . Fibromyalgia 10/18/2015  . Candidiasis of breast 04/28/2015  . Candida glabrata infection 04/28/2015  . Long-term use of high-risk medication 04/13/2014  . History of high risk medication treatment 04/13/2014  . Lumbar compression fracture ([place]) 02/13/2014  . Compression fracture of L5 lumbar vertebra ([place]) 01/25/2014  . Compression fracture of lumbar vertebra ([place]) 01/25/2014  . Medicare annual wellness visit, subsequent 09/28/2012  . Encounter for general adult medical examination without abnormal findings 09/28/2012  . History of thyroid nodule 06/13/2012  . Benign colonic polyp 06/13/2012  . History of thyroid disease 06/13/2012  . OSA on CPAP 06/06/2012  . Cerebrovascular disease 06/04/2012  . Hypertension 06/04/2012  . Hyperlipidemia 06/04/2012  . Osteoporosis 06/04/2012   Lady Deutscher PT, DPT Lady Deutscher 08/29/2016, 3:38 PM  [place] MAIN [facility] SERVICES [address] [place], [place], [address] Phone: [phone]    Fax:  [phone]  Name: Ellen Hamilton MRN: [id number] Date of Birth: [birth year]

## 2016-09-02 ENCOUNTER — Ambulatory Visit: Payer: Medicare Other | Admitting: Physical Therapy

## 2016-09-05 ENCOUNTER — Ambulatory Visit: Payer: Medicare Other | Admitting: Physical Therapy

## 2016-09-05 ENCOUNTER — Encounter: Payer: Self-pay | Admitting: Physical Therapy

## 2016-09-05 DIAGNOSIS — R262 Difficulty in walking, not elsewhere classified: Secondary | ICD-10-CM | POA: Diagnosis not present

## 2016-09-05 DIAGNOSIS — R296 Repeated falls: Secondary | ICD-10-CM | POA: Diagnosis not present

## 2016-09-05 NOTE — Therapy (Signed)
[place] MAIN [facility] SERVICES [address] [place], [place], [address] Phone: [phone]   Fax:  [phone]  Physical Therapy Treatment  Patient Details  Name: Ellen Hamilton MRN: [id number] Date of Birth: 08/23/1950 Referring Provider: recurrent falls  Encounter Date: 09/05/2016      PT End of Session - 09/05/16 1126    Visit Number 17   Number of Visits 25   Date for PT Re-Evaluation 10/31/16   Authorization Type Needs G codes    Authorization Time Period (P)  17/20   PT Start Time 1115   PT Stop Time 1214   PT Time Calculation (min) 59 min   Equipment Utilized During Treatment Gait belt   Activity Tolerance Patient tolerated treatment well   Behavior During Therapy WFL for tasks assessed/performed      Past Medical History:  Diagnosis Date  . Compression fracture of L5 lumbar vertebra ([place]) 01/2014  . Fibromyalgia   . GERD (gastroesophageal reflux disease)   . History of transient ischemic attack (TIA) 9407,6808  . Hyperlipidemia   . Hypertension   . Osteoporosis   . Sleep apnea 2009   sleep study done in   . Stroke [facility]) [phone]   pt has had two    Past Surgical History:  Procedure Laterality Date  . BACK SURGERY    . BUNIONECTOMY Bilateral [id number]  . CATARACT EXTRACTION W/ INTRAOCULAR LENS  IMPLANT, BILATERAL  2010  . EYE SURGERY    . kyphoplaasty  2010  . KYPHOPLASTY N/A 02/13/2014   Procedure: KYPHOPLASTY LUMBAR FIVE;  Surgeon: Charlie Pitter, MD;  Location: [facility];  Service: Neurosurgery;  Laterality: N/A;  . LAPAROSCOPIC APPENDECTOMY N/A 01/11/2016   Procedure: APPENDECTOMY LAPAROSCOPIC;  Surgeon: Florene Glen, MD;  Location: [facility] ORS;  Service: General;  Laterality: N/A;    There were no vitals filed for this visit.      Subjective Assessment - 09/05/16 1125    Subjective Patient states that the movement exercises in the water are helping a lot. Patient states that she feels 75%  improvement overall since starting therapy. Patient states that she feels she is now better able to self-correct when her form is off with walking and turning. Patient states that she is feeling more confident with her balance and better able to walk and turn.    Patient is accompained by: Family member   Pertinent History Patient states she had her appendix out in September 2017. Patient and her husband state that patient has had a decline in her mobility and balance since the surgery. Patient reports she has had 3-4 falls in the past six months and several near miss falls. Patient states she had a fall in the kitchen when she turned to reach for something. Patient states she cannot do quick turns. Patient states she is afraid of falling and states she feels she does not have the same stability as she did before. Patient feels her gait has deteriorted and is using her cane all the time. They report that the patient is not picking up her feet and catches her left toe instead of heel strike first. Patient exercises daily including going to the pool 5 days a week, doing the Nustep machine for 15-20 minutes daily, treadmill walking 7 minutes and sled. In addition, patient does Theraband exercises and works on high stepping and ladder work. Patient does about 15-20 minutes of these exercises. Patient states she had a kyphoplasty due to  a compression fracture from straining using the restroom in August 2015 and the surgery was October 2015. Patient has a history of multiple compression fractures involving T8, T12 and L5 per patient and husband report. Patient has a history of osteoporosis and patient suffered from TIA in 2008 and stroke in 2009 affecting primarily her right leg. Patient states she had a kyphoplasty due to a compression fracture from straining using the restroom in August 2015 and the surgery was October 2015. Patient has a history of multiple compression fractures involving T8, T12 and L5 per patient  and husband report. Patient has a history of osteoporosis and patient suffered from TIA in 2008 and stroke in 2009 affecting primarily her right leg.     Patient Stated Goals patient states she would like to be able to walk better, be able to turn around better and to have improved balance   Currently in Pain? No/denies            [facility] PT Assessment - 09/05/16 1126      Berg Balance Test   Sit to Stand Able to stand without using hands and stabilize independently   Standing Unsupported Able to stand safely 2 minutes   Sitting with Back Unsupported but Feet Supported on Floor or Stool Able to sit safely and securely 2 minutes   Stand to Sit Sits safely with minimal use of hands   Transfers Able to transfer safely, minor use of hands   Standing Unsupported with Eyes Closed Able to stand 10 seconds safely   Standing Ubsupported with Feet Together Able to place feet together independently and stand 1 minute safely   From Standing, Reach Forward with Outstretched Arm Can reach confidently >25 cm (10")   From Standing Position, Pick up Object from Floor Able to pick up shoe safely and easily   From Standing Position, Turn to Look Behind Over each Shoulder Looks behind from both sides and weight shifts well   Turn 360 Degrees Able to turn 360 degrees safely one side only in 4 seconds or less   Standing Unsupported, Alternately Place Feet on Step/Stool Able to stand independently and safely and complete 8 steps in 20 seconds   Standing Unsupported, One Foot in Front Able to place foot tandem independently and hold 30 seconds   Standing on One Leg Able to lift leg independently and hold > 10 seconds   Total Score 55     Physical Performance:  FUNCTIONAL OUTCOME MEASURES:  Results Comments  TUG 13.1 seconds Greater than 14 seconds indicated high fall risk  ABC Scale 54% Falls risk; in need of intervention  10 meter walking speed 0.6 m/sec Limited community ambulator level   BERG balance  test 55/56 Safe for community ambulation  LEFS 32/80 In need of intervention   Discussed functional outcome measure testing and compared to prior test results. Discussed progress towards goals and plan of care.     Neuromuscular Re-education:  Obstacle Course: Patient performed obstacle course multiple reps which included the following activities: navigating over balance pods, slide board, 1/2 foam rolls and stepping onto foam pads and navigating between cones placed on floor as well as turning. Patient did well navigating on the course and was most challenged by navigating through cones placed close together. Patient did need to adjust her gait and slow down to adjust navigate through the cones. Patient used [facility] during this activity and required CGA.  Speed/Agility Tasks: In // bars with bilateral UEs holding for  support as needed, patient worked on step tapping forward, return to midline and backwards multiple reps. Then, worked on alternating step taps inferolaterally Left and Right working on speed of movement. Worked on alternating stepping out to the side and back, finding balance and then returning to midline and alternating between stepping back with left and right leg.       PT Education - 09/05/16 1125    Education provided Yes   Education Details Discussed functional outcome measure retesting and progress towards goals; discussed plans to end land therapy and to do a few aquatic therapy treatments to work on an aquatic home exercise program.    Person(s) Educated Patient;Spouse   Methods Explanation   Comprehension Verbalized understanding             PT Long Term Goals - 09/05/16 [place] #1   Title Patient will reduce falls risk as indicated by Activities Specific Balance Confidence Scale (ABC) >67% by 08/08/2016.   Baseline scored 33% on 06/13/2016., 07/15/16: 33.4%; scored 42.5% on 08/19/2016; scored 54% on 09/05/2016   Time 8   Period Weeks   Status  Partially Met     PT LONG TERM GOAL #2   Title Patient will be able to perform home program independently for self-management.   Time 8   Period Weeks   Status Achieved     PT LONG TERM GOAL #3   Title Patient will improve self-selected gait speed to 0.7 m/s or greater on 10 m walk to indicate reduced falls risk, improved community mobility and independence with ADL's.   Baseline average gait speed on 06/13/2016 was 0.59 m/sec, 07/15/16: 0.81 m/s; scored 0.83 m/sec on 08/19/2016; scored 0.6 m/sec on 4/27   Time 8   Period Weeks   Status Achieved     PT LONG TERM GOAL #4   Title Patient will score less than 14 seconds on the TUG test to indicate reduced falls risk.    Baseline scored 19 sec on 06/13/2016, 07/15/16: 13.8 seconds; scored 17 seconds on 08/19/2016; scored 13/15 seconds on 09/05/2016   Time 8   Period Weeks   Status Partially Met     PT LONG TERM GOAL #5   Title Patient will reduce falls risk as indicated by Merrilee Jansky Balance Scale Score of 49/56 or greater.   Baseline scored 42/56 on 06/13/2016; 07/15/16: 51/56; scored 55/56 on 09/05/2016   Time 8   Period Weeks   Status Achieved     PT LONG TERM GOAL #6   Title Patient will have improved score of 9 or more points on the lower extremilty functional scale in order to improve patients ability to perform ADLs.   Baseline scored 26/80 on 06/16/2016; 07/15/16: 16/80; scored 26/80 on 08/19/2016; scored 32/80 on 09/05/2016   Time 8   Period Weeks   Status Partially Met     PT LONG TERM GOAL #7   Title Patient will reduce falls risk as indicated by Merrilee Jansky Balance Scale Score of 54/56 or greater.   Baseline 07/15/16: 51/56; scored 55/56 on 09/05/2016   Time 8   Period Weeks   Status Achieved     PT LONG TERM GOAL #8   Title Patient will be independent in aquatic based home exercise program to help maintain patient's functional status and functional independence by 09/30/2016.   Time 6   Period Weeks   Status Deferred  Plan -  09/05/16 1126    Clinical Impression Statement Repeated functional outcome testing this date and patient has demonstrated good improvements as evidenced by a decrease in TUG time from 19 seconds at initial evaluation to 13.1 seconds this date. In addition, patient improved from 42/56 to 55/56 on the Berg balance test. Patient improved from 33% to 54% on the ABC scale and imprved from 26/80 to 32/80 on the LEFS scale. Patient has met 3/7 goals and paritally met 4/7 goals as set on plan of care. Patient would has completed land physical therapy and would like to have a few visits of aquatic therapy to work on establishing an aquatic therapy specific home exercise program as pateint reports taht she utilizes the pool at her living facility several days a week to do her exercises. Patient and her husband are in agreement to complete land based physical therapy services at this time. Recommend that patient returns annually for PT visit to perform functional outcome testing and for PT evaluation to determine if patient would benefit from physical therapy services if she has experienced any decline in her strength and balance outcome levels as compared to prior testing.    Rehab Potential Fair   Clinical Impairments Affecting Rehab Potential Positive indicators: motivated, family support     Negative indicators: prior CVA with residual deficits primarily affecting right LE   PT Frequency 2x / week   PT Duration 6 weeks   PT Treatment/Interventions Balance training;Therapeutic exercise;Therapeutic activities;Gait training;Stair training;Neuromuscular re-education;Patient/family education;Functional mobility training;Aquatic Therapy;Manual techniques   PT Next Visit Plan --   PT Home Exercise Plan sidelying clam shells, bridging, SLR (start with R knee flexed until she can perform through full range), slow marching with faded UEs assist, reverse clam shells with theraband resistance, seated theraband resisted hip  abd,add and flexion. In the pool working on quick alternating high knee marching and simulated "kick ball" focusing on speed and strength of kicking. working on right/ left turning.   Consulted and Agree with Plan of Care Patient;Family member/caregiver   Family Member Consulted husband- Payton Moder      Patient will benefit from skilled therapeutic intervention in order to improve the following deficits and impairments:  Decreased balance, Difficulty walking, Decreased strength, Decreased range of motion, Decreased mobility, Pain  Visit Diagnosis: Difficulty in walking, not elsewhere classified  Repeated falls     Problem List Patient Active Problem List   Diagnosis Date Noted  . Influenza A 05/27/2016  . Acne rosacea 01/15/2016  . Colonic diverticular disease 01/15/2016  . Obesity 01/15/2016  . S/P appendectomy   . Acne erythematosa 10/18/2015  . Adiposity 10/18/2015  . Allergic rhinitis 10/18/2015  . Cerebrovascular accident (CVA) ([place]) 10/18/2015  . Compression fracture of thoracic vertebra ([place]) 10/18/2015  . Diverticulosis of large intestine without perforation or abscess without bleeding 10/18/2015  . Esophagitis 10/18/2015  . Fibromyalgia 10/18/2015  . Candidiasis of breast 04/28/2015  . Candida glabrata infection 04/28/2015  . Long-term use of high-risk medication 04/13/2014  . History of high risk medication treatment 04/13/2014  . Lumbar compression fracture ([place]) 02/13/2014  . Compression fracture of L5 lumbar vertebra ([place]) 01/25/2014  . Compression fracture of lumbar vertebra ([place]) 01/25/2014  . Medicare annual wellness visit, subsequent 09/28/2012  . Encounter for general adult medical examination without abnormal findings 09/28/2012  . History of thyroid nodule 06/13/2012  . Benign colonic polyp 06/13/2012  . History of thyroid disease 06/13/2012  . OSA on CPAP 06/06/2012  .  Cerebrovascular disease 06/04/2012  . Hypertension 06/04/2012  . Hyperlipidemia  06/04/2012  . Osteoporosis 06/04/2012   Lady Deutscher PT, DPT [phone] Lady Deutscher 09/05/2016, 3:35 PM  [place] MAIN [facility] SERVICES [address] [place], [place], [address] Phone: [phone]   Fax:  [phone]  Name: Ellen Hamilton MRN: [id number] Date of Birth: 1950/12/10

## 2016-09-11 ENCOUNTER — Ambulatory Visit: Payer: Medicare Other | Admitting: Physical Therapy

## 2016-09-16 ENCOUNTER — Ambulatory Visit: Payer: Medicare Other | Admitting: Physical Therapy

## 2016-09-18 ENCOUNTER — Ambulatory Visit: Payer: Medicare Other | Admitting: Physical Therapy

## 2016-09-23 ENCOUNTER — Ambulatory Visit: Payer: Medicare Other | Admitting: Physical Therapy

## 2016-09-29 ENCOUNTER — Encounter: Payer: Self-pay | Admitting: Internal Medicine

## 2016-09-29 ENCOUNTER — Ambulatory Visit (INDEPENDENT_AMBULATORY_CARE_PROVIDER_SITE_OTHER): Payer: Medicare Other | Admitting: Internal Medicine

## 2016-09-29 VITALS — BP 124/88 | HR 65 | Temp 98.5°F | Resp 15 | Ht 63.0 in | Wt 173.6 lb

## 2016-09-29 DIAGNOSIS — I10 Essential (primary) hypertension: Secondary | ICD-10-CM | POA: Diagnosis not present

## 2016-09-29 DIAGNOSIS — M8000XS Age-related osteoporosis with current pathological fracture, unspecified site, sequela: Secondary | ICD-10-CM

## 2016-09-29 DIAGNOSIS — Z79899 Other long term (current) drug therapy: Secondary | ICD-10-CM | POA: Diagnosis not present

## 2016-09-29 DIAGNOSIS — I679 Cerebrovascular disease, unspecified: Secondary | ICD-10-CM | POA: Diagnosis not present

## 2016-09-29 DIAGNOSIS — E559 Vitamin D deficiency, unspecified: Secondary | ICD-10-CM

## 2016-09-29 DIAGNOSIS — E7800 Pure hypercholesterolemia, unspecified: Secondary | ICD-10-CM

## 2016-09-29 DIAGNOSIS — I63312 Cerebral infarction due to thrombosis of left middle cerebral artery: Secondary | ICD-10-CM

## 2016-09-29 LAB — COMPREHENSIVE METABOLIC PANEL
ALK PHOS: 43 U/L (ref 39–117)
ALT: 13 U/L (ref 0–35)
AST: 21 U/L (ref 0–37)
Albumin: 4.7 g/dL (ref 3.5–5.2)
BUN: 12 mg/dL (ref 6–23)
CO2: 31 meq/L (ref 19–32)
Calcium: 9.8 mg/dL (ref 8.4–10.5)
Chloride: 102 mEq/L (ref 96–112)
Creatinine, Ser: 0.7 mg/dL (ref 0.40–1.20)
GFR: 88.71 mL/min (ref >60.00)
GLUCOSE: 93 mg/dL (ref 70–99)
POTASSIUM: 3.9 meq/L (ref 3.5–5.1)
SODIUM: 139 meq/L (ref 135–145)
TOTAL PROTEIN: 7.5 g/dL (ref 6.0–8.3)
Total Bilirubin: 0.6 mg/dL (ref 0.2–1.2)

## 2016-09-29 LAB — LIPID PANEL
CHOL/HDL RATIO: 3
Cholesterol: 112 mg/dL (ref 0–200)
HDL: 39.8 mg/dL (ref >39.00)
LDL Cholesterol: 56 mg/dL (ref 0–99)
NONHDL: 72.17
Triglycerides: 81 mg/dL (ref 0.0–149.0)
VLDL: 16.2 mg/dL (ref 0.0–40.0)

## 2016-09-29 LAB — VITAMIN D 25 HYDROXY (VIT D DEFICIENCY, FRACTURES): VITD: 67.32 ng/mL (ref 30.00–100.00)

## 2016-09-29 NOTE — Patient Instructions (Addendum)
We discussed a water based  resistance training system called " Aqua Logix"  It might be worth looking into if you want to continue water based resistance exercises.  I will refill your medications for 6 months,  But you will need to have labs repeated prior to refills beyond that  Your Prolia appointment will be made once we receive your next dose    You are up to doate on all immunizations  (Tetanus was 2016)   The ShingRx vaccine will be available in about 6 months and IS ADVISED for all interested adults over 50 to prevent shingles

## 2016-09-29 NOTE — Progress Notes (Signed)
Subjective:  Patient ID: Ellen Hamilton, female    DOB: November 27, 1950  Age: 66 y.o. MRN: [id number]  CC: The primary encounter diagnosis was Vitamin D deficiency. Diagnoses of Pure hypercholesterolemia, Long-term use of high-risk medication, Age-related osteoporosis with current pathological fracture, sequela, Essential hypertension, and Cerebrovascular disease were also pertinent to this visit.  HPI Breland Elders presents for FOLLOW UP ON chronic conditions   Mammogram march 2018 Colon 2016 Appendectomy  2016  PAP ATROPHY 2017,  HPV NEG Had annual eye exam at [facility]  Hearing test deferred  Weakness  Has improved with PT which she has  now finished,  Using the pool   Platelet function test done at [facility] confirms that she is a responder to Plavix  Requesting refills of all meds.for one year. Because she plans to move back  to the [facility]    Outpatient Medications Prior to Visit  Medication Sig Dispense Refill  . amLODipine (NORVASC) 5 MG tablet Take 1 tablet (5 mg total) by mouth daily. 90 tablet 3  . CALCIUM CITRATE PO Take 500 mg by mouth 3 (three) times daily.    . cholecalciferol (VITAMIN D) 1000 UNITS tablet Take 1,000 Units by mouth daily.    . clopidogrel (PLAVIX) 75 MG tablet Take 1 tablet (75 mg total) by mouth daily. 90 tablet 2  . denosumab (PROLIA) 60 MG/ML SOLN injection Inject 60 mg into the skin every 6 (six) months. Administer in upper arm, thigh, or abdomen    . fish oil-omega-3 fatty acids 1000 MG capsule Take 1,000 mg by mouth daily.    Marland Kitchen ibuprofen (ADVIL,MOTRIN) 200 MG tablet Take 400 mg by mouth 2 (two) times daily.    . metoprolol succinate (TOPROL-XL) 100 MG 24 hr tablet Take 1 tablet (100 mg total) by mouth daily. Take with or immediately following a meal. 90 tablet 3  . Multiple Vitamin (MULTIVITAMIN) tablet Take 1 tablet by mouth daily.    . ranitidine (ZANTAC) 150 MG tablet Take 1 tablet (150 mg total) by mouth daily. 90 tablet 3  .  rosuvastatin (CRESTOR) 40 MG tablet Take 1 tablet (40 mg total) by mouth daily. 90 tablet 3  . vitamin B-12 (CYANOCOBALAMIN) 1000 MCG tablet Take 1,000 mcg by mouth every other day.    Marland Kitchen aspirin 81 MG tablet Take 81 mg by mouth 2 (two) times daily.      No facility-administered medications prior to visit.     Review of Systems;  Patient denies headache, fevers, malaise, unintentional weight loss, skin rash, eye pain, sinus congestion and sinus pain, sore throat, dysphagia,  hemoptysis , cough, dyspnea, wheezing, chest pain, palpitations, orthopnea, edema, abdominal pain, nausea, melena, diarrhea, constipation, flank pain, dysuria, hematuria, urinary  Frequency, nocturia, numbness, tingling, seizures,  Focal weakness, Loss of consciousness,  Tremor, insomnia, depression, anxiety, and suicidal ideation.      Objective:  BP 124/88 (BP Location: Left Arm, Patient Position: Sitting, Cuff Size: Normal)   Pulse 65   Temp 98.5 F (36.9 C) (Oral)   Resp 15   Ht 5\' 3"  (1.6 m)   Wt 173 lb 9.6 oz (78.7 kg)   SpO2 95%   BMI 30.75 kg/m   BP Readings from Last 3 Encounters:  09/29/16 124/88  08/13/16 128/70  07/15/16 132/75    Wt Readings from Last 3 Encounters:  09/29/16 173 lb 9.6 oz (78.7 kg)  08/13/16 175 lb 12.8 oz (79.7 kg)  05/26/16 180 lb (81.6 kg)  General appearance: alert, cooperative and appears stated age Ears: normal TM's and external ear canals both ears Throat: lips, mucosa, and tongue normal; teeth and gums normal Neck: no adenopathy, no carotid bruit, supple, symmetrical, trachea midline and thyroid not enlarged, symmetric, no tenderness/mass/nodules Back: symmetric, no curvature. ROM normal. No CVA tenderness. Lungs: clear to auscultation bilaterally Heart: regular rate and rhythm, S1, S2 normal, no murmur, click, rub or gallop Abdomen: soft, non-tender; bowel sounds normal; no masses,  no organomegaly Pulses: 2+ and symmetric Skin: Skin color, texture, turgor  normal. No rashes or lesions Lymph nodes: Cervical, supraclavicular, and axillary nodes normal.  No results found for: HGBA1C  Lab Results  Component Value Date   CREATININE 0.70 09/29/2016   CREATININE 0.59 01/11/2016   CREATININE 0.66 01/10/2016    Lab Results  Component Value Date   WBC 6.6 01/11/2016   HGB 13.6 01/11/2016   HCT 39.2 01/11/2016   PLT 167 01/11/2016   GLUCOSE 93 09/29/2016   CHOL 112 09/29/2016   TRIG 81.0 09/29/2016   HDL 39.80 09/29/2016   LDLCALC 56 09/29/2016   ALT 13 09/29/2016   AST 21 09/29/2016   NA 139 09/29/2016   K 3.9 09/29/2016   CL 102 09/29/2016   CREATININE 0.70 09/29/2016   BUN 12 09/29/2016   CO2 31 09/29/2016   TSH 4.26 10/01/2015    Dg Bone Density  Result Date: 04/22/2016 EXAM: DUAL X-RAY ABSORPTIOMETRY (DXA) FOR BONE MINERAL DENSITY IMPRESSION: Dear Dr Deborra Medina, Your patient Ellen Hamilton completed a BMD test on 04/22/2016 using the [place] (analysis version: 14.10) manufactured by [employer]. The following summarizes the results of our evaluation. PATIENT BIOGRAPHICAL: Name: [name], [name] Patient ID: [id number] Birth Date: April 24, 1951 Height: 61.5 in. Gender: Female Exam Date: 04/22/2016 Weight: 181.2 lbs. Indications: Advanced Age, Caucasian, Family History of Fracture, Height Loss, History of Fracture (Adult), POSTmenopausal Fractures: FINGER, RIBS, Spine Treatments: 81 MG ASPIRIN, Calcium, multivitamin, prolia, Vitamin D ASSESSMENT: The BMD measured at Femur Neck Left is 0.815 g/cm2 with a T-score of -1.6. This patient is considered osteopenic according to [facility]) criteria.Patient is not a candidate for FRAX assessment due to prolia use. Site Region Measured Measured WHO Young Adult BMD Date       Age      Classification T-score DualFemur Neck Left 04/22/2016 66.5 Osteopenia -1.6 0.815 g/cm2 DualFemur Neck Left 04/26/2014 64.5 Osteopenia -1.8 0.781 g/cm2 AP Spine L1-L4  3/22/2016 66.5 Normal -0.2 1.160 g/cm2 AP Spine L1-L4 04/26/2014 64.5 Normal -0.7 1.109 g/cm2 World Health Organization [facility]) criteria for post-menopausal, Caucasian Women: Normal:       T-score at or above -1 SD Osteopenia:   T-score between -1 and -2.5 SD Osteoporosis: T-score at or below -2.5 SD RECOMMENDATIONS: [place] recommends that FDA-approved medical therapies be considered in postemenopausal women and men age 71 or older with a: 1. Hip or vertebral (clinical or morphometric) fracture. 2. T-score of < -2.5at the spine or hip. 3. Ten-year fracture probability by FRAX of 3% or greater for hip fracture or 20% or greater for major osteoporotic fracture. All treatment decisions require clinical judgment and consideration of individual patient factors, including patient preferences, co-morbidities, previous drug use, risk factors not captured in the FRAX model (e.g. falls, vitamin D deficiency, increased bone turnover, interval significant decline in bone density) and possible under - or over-estimation of fracture risk by FRAX. All patients should ensure an adequate intake of dietary calcium (1200  mg/d) and vitamin D (800 IU daily) unless contraindicated. FOLLOW-UP: People with diagnosed cases of osteoporosis or at high risk for fracture should have regular bone mineral density tests. For patients eligible for Medicare, routine testing is allowed once every 2 years. The testing frequency can be increased to one year for patients who have rapidly progressing disease, those who are receiving or discontinuing medical therapy to restore bone mass, or have additional risk factors. I have reviewed this report, and agree with the above findings. [facility] Electronically Signed   By: Lahoma Crocker M.D.   On: 04/22/2016 14:26    Assessment & Plan:   Problem List Items Addressed This Visit    Osteoporosis    With prior thoracic and lumbar fractures,  Continue semi annual Prolia  injection , next treatment due  IN jULY       Long-term use of high-risk medication   Relevant Orders   Comprehensive metabolic panel (Completed)   Hypertension     Well controlled on current regimen. Renal function stable, no changes today.  Lab Results  Component Value Date   CREATININE 0.70 09/29/2016   Lab Results  Component Value Date   NA 139 09/29/2016   K 3.9 09/29/2016   CL 102 09/29/2016   CO2 31 09/29/2016         Hyperlipidemia    Managed with Crestor 40 mg daily.  LFTs normal.  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   I have discontinued Ms. Cunanan's aspirin. I am also having her maintain her vitamin B-12, cholecalciferol, fish oil-omega-3 fatty acids, multivitamin, CALCIUM CITRATE PO, ibuprofen, amLODipine, rosuvastatin, ranitidine, metoprolol succinate, denosumab, and clopidogrel.  No orders of the defined types were placed in this encounter.   Medications Discontinued During This Encounter  Medication Reason  . aspirin 81 MG tablet Patient has not taken in last 30 days    Follow-up: Return in about 6 months (around 04/01/2017), or labs, follow up.   Crecencio Mc, MD

## 2016-09-30 ENCOUNTER — Ambulatory Visit: Payer: Medicare Other | Attending: Internal Medicine | Admitting: Physical Therapy

## 2016-09-30 DIAGNOSIS — R262 Difficulty in walking, not elsewhere classified: Secondary | ICD-10-CM | POA: Diagnosis not present

## 2016-09-30 DIAGNOSIS — R296 Repeated falls: Secondary | ICD-10-CM

## 2016-10-01 NOTE — Assessment & Plan Note (Signed)
  Well controlled on current regimen. Renal function stable, no changes today.  Lab Results  Component Value Date   CREATININE 0.70 09/29/2016   Lab Results  Component Value Date   NA 139 09/29/2016   K 3.9 09/29/2016   CL 102 09/29/2016   CO2 31 09/29/2016

## 2016-10-01 NOTE — Therapy (Addendum)
[place] MAIN [facility] SERVICES [address] [place], [place], [address] Phone: [phone]   Fax:  [phone]  Physical Therapy Treatment  Patient Details  Name: Ellen Hamilton MRN: [id number] Date of Birth: 01/18/1951 Referring Provider: recurrent falls  Encounter Date: 09/30/2016      PT End of Session - 10/01/16 1001    Visit Number 18   Number of Visits 25   Date for PT Re-Evaluation 10/31/16   Authorization Type Needs G codes    Authorization Time Period 18/20   PT Start Time 0945   PT Stop Time 1030   PT Time Calculation (min) 45 min   Equipment Utilized During Treatment Gait belt   Activity Tolerance Patient tolerated treatment well   Behavior During Therapy WFL for tasks assessed/performed      Past Medical History:  Diagnosis Date  . Compression fracture of L5 lumbar vertebra ([place]) 01/2014  . Fibromyalgia   . GERD (gastroesophageal reflux disease)   . History of transient ischemic attack (TIA) 3151,7616  . Hyperlipidemia   . Hypertension   . Osteoporosis   . Sleep apnea 2009   sleep study done in [place]  . Stroke [facility]) [phone]   pt has had two    Past Surgical History:  Procedure Laterality Date  . BACK SURGERY    . BUNIONECTOMY Bilateral [id number]  . CATARACT EXTRACTION W/ INTRAOCULAR LENS  IMPLANT, BILATERAL  2010  . EYE SURGERY    . kyphoplaasty  2010  . KYPHOPLASTY N/A 02/13/2014   Procedure: KYPHOPLASTY LUMBAR FIVE;  Surgeon: Charlie Pitter, MD;  Location: [facility] ORS;  Service: Neurosurgery;  Laterality: N/A;  . LAPAROSCOPIC APPENDECTOMY N/A 01/11/2016   Procedure: APPENDECTOMY LAPAROSCOPIC;  Surgeon: Florene Glen, MD;  Location: [facility] ORS;  Service: General;  Laterality: N/A;    There were no vitals filed for this visit.      Subjective Assessment - 10/01/16 0951    Subjective Pt states she has been doing exercises for speed. When she works out with her land therapist, she feels a 10/10 level  of exertion and feels she used muscles she has not used before.     Patient is accompained by: Family member   Pertinent History Patient states she had her appendix out in September 2017. Patient and her husband state that patient has had a decline in her mobility and balance since the surgery. Patient reports she has had 3-4 falls in the past six months and several near miss falls. Patient states she had a fall in the kitchen when she turned to reach for something. Patient states she cannot do quick turns. Patient states she is afraid of falling and states she feels she does not have the same stability as she did before. Patient feels her gait has deteriorted and is using her cane all the time. They report that the patient is not picking up her feet and catches her left toe instead of heel strike first. Patient exercises daily including going to the pool 5 days a week, doing the Nustep machine for 15-20 minutes daily, treadmill walking 7 minutes and sled. In addition, patient does Theraband exercises and works on high stepping and ladder work. Patient does about 15-20 minutes of these exercises. Patient states she had a kyphoplasty due to a compression fracture from straining using the restroom in August 2015 and the surgery was October 2015. Patient has a history of multiple compression fractures involving T8, T12 and L5  per patient and husband report. Patient has a history of osteoporosis and patient suffered from TIA in 2008 and stroke in 2009 affecting primarily her right leg. Patient states she had a kyphoplasty due to a compression fracture from straining using the restroom in August 2015 and the surgery was October 2015. Patient has a history of multiple compression fractures involving T8, T12 and L5 per patient and husband report. Patient has a history of osteoporosis and patient suffered from TIA in 2008 and stroke in 2009 affecting primarily her right leg.     Patient Stated Goals patient states she  would like to be able to walk better, be able to turn around better and to have improved balance                     Adult Aquatic Therapy - 10/01/16 0959      Aquatic Therapy Subjective   Subjective Pt had no complaints during the exercises. During the rest period at the end of the session, pt reported back pain when seated on a noodle against the hand rail.          O: Pt entered/exited the pool via steps with single UE support on rail.  50 ft =1 lap  Exercises performed in 3'6" depth   4 steps up and down x 2 sets  with cue for mini steps to maintain proper alignment of knee and ankle with lateral stepping down steps (shift feet due to wide depth of step)  using BUE on rail and cues for ascending steps with more plantarflexion and quad control with single UE on rail. Pt demo'd properly following cues     Diona Foley toss 5' with husband tossing ball from pool platform Cued for lateral stepping strategies  Noted pt was able to self-correction with self-imposed perturbations following punching ball  PT explained the importance of training with unexpected perturbations to mimic real life environments   2 laps of perturbation training with PT providing manual perturbations at waist in four directions Noted pt was slower with R lateral and posterior stepping strategies   4 laps backward walking w/ 2 noodles . Cued for propioception using pool line at pool floor, more plantarflexion and eccentric control of hamstrings and plantar fascia, shoulder depression with placement fo noodles under arms instead of armpits   2 laps modified grapevine with RLE sidestepping and  LLE stepping behind. Cued for foot pivot and trunk/pelvis/knee alignment . Pt had difficulty with the pivot turn    stretches:  Stretches : (Cuing provided for proper alignment) ROM of each joint wall stretch Mini squat-figure 4 (piriformis) hip flexor stretch  hip flexor twist on step   Seated on noodle by  wall with noodles under armpits 5'  Pt reported back ache due to the rail against her back. Plan to have pt seat on bench at next session                 PT Long Term Goals - 09/05/16 1148      PT LONG TERM GOAL #1   Title Patient will reduce falls risk as indicated by Activities Specific Balance Confidence Scale (ABC) >67% by 08/08/2016.   Baseline scored 33% on 06/13/2016., 07/15/16: 33.4%; scored 42.5% on 08/19/2016; scored 54% on 09/05/2016   Time 8   Period Weeks   Status Partially Met     PT LONG TERM GOAL #2   Title Patient will be able to perform home program independently for  self-management.   Time 8   Period Weeks   Status Achieved     PT LONG TERM GOAL #3   Title Patient will improve self-selected gait speed to 0.7 m/s or greater on 10 m walk to indicate reduced falls risk, improved community mobility and independence with ADL's.   Baseline average gait speed on 06/13/2016 was 0.59 m/sec, 07/15/16: 0.81 m/s; scored 0.83 m/sec on 08/19/2016; scored 0.6 m/sec on 4/27   Time 8   Period Weeks   Status Achieved     PT LONG TERM GOAL #4   Title Patient will score less than 14 seconds on the TUG test to indicate reduced falls risk.    Baseline scored 19 sec on 06/13/2016, 07/15/16: 13.8 seconds; scored 17 seconds on 08/19/2016; scored 13/15 seconds on 09/05/2016   Time 8   Period Weeks   Status Partially Met     PT LONG TERM GOAL #5   Title Patient will reduce falls risk as indicated by Merrilee Jansky Balance Scale Score of 49/56 or greater.   Baseline scored 42/56 on 06/13/2016; 07/15/16: 51/56; scored 55/56 on 09/05/2016   Time 8   Period Weeks   Status Achieved     PT LONG TERM GOAL #6   Title Patient will have improved score of 9 or more points on the lower extremilty functional scale in order to improve patients ability to perform ADLs.   Baseline scored 26/80 on 06/16/2016; 07/15/16: 16/80; scored 26/80 on 08/19/2016; scored 32/80 on 09/05/2016   Time 8   Period Weeks   Status Partially  Met     PT LONG TERM GOAL #7   Title Patient will reduce falls risk as indicated by Merrilee Jansky Balance Scale Score of 54/56 or greater.   Baseline 07/15/16: 51/56; scored 55/56 on 09/05/2016   Time 8   Period Weeks   Status Achieved     PT LONG TERM GOAL #8   Title Patient will be independent in aquatic based home exercise program to help maintain patient's functional status and functional independence by 09/30/2016.   Time 6   Period Weeks   Status Deferred               Plan - 10/01/16 1001    Clinical Impression Statement Pt required excessive cuing to slow down during neuro-muscular reeducation to focus more on propioception, weight bearing, and eccentrol control to promote better stepping reflexes and specific lower kinetic chain strengthening. Incorporated perturbation training, stair navigation, and safety/ postural stability with BUE overhead. Pt had no complaints during exercises. Pt had backache with rest period when seated on noodle by the wall rail. Plan to have her cool down rest period seated on the pool bench to minimize backache. Pt had difficulty with pivot turns with modified grapevine and will require more training at upcoming session.  Plan to also upgrade dynamic stability and activities to challenge speed and power.   Pt will continue to benefit from skilled PT.    Rehab Potential Fair   Clinical Impairments Affecting Rehab Potential Positive indicators: motivated, family support     Negative indicators: prior CVA with residual deficits primarily affecting right LE   PT Frequency 2x / week   PT Duration 6 weeks   PT Treatment/Interventions Balance training;Therapeutic exercise;Therapeutic activities;Gait training;Stair training;Neuromuscular re-education;Patient/family education;Functional mobility training;Aquatic Therapy;Manual techniques   PT Home Exercise Plan sidelying clam shells, bridging, SLR (start with R knee flexed until she can perform through full range), slow  marching with faded  UEs assist, reverse clam shells with theraband resistance, seated theraband resisted hip abd,add and flexion. In the pool working on quick alternating high knee marching and simulated "kick ball" focusing on speed and strength of kicking. working on right/ left turning.   Consulted and Agree with Plan of Care Patient;Family member/caregiver   Family Member Consulted husband- Avelyn Touch      Patient will benefit from skilled therapeutic intervention in order to improve the following deficits and impairments:  Decreased balance, Difficulty walking, Decreased strength, Decreased range of motion, Decreased mobility, Pain  Visit Diagnosis: Difficulty in walking, not elsewhere classified  Repeated falls     Problem List Patient Active Problem List   Diagnosis Date Noted  . Influenza A 05/27/2016  . Acne rosacea 01/15/2016  . Colonic diverticular disease 01/15/2016  . Obesity 01/15/2016  . S/P appendectomy   . Acne erythematosa 10/18/2015  . Adiposity 10/18/2015  . Allergic rhinitis 10/18/2015  . Cerebrovascular accident (CVA) ([place]) 10/18/2015  . Compression fracture of thoracic vertebra ([place]) 10/18/2015  . Diverticulosis of large intestine without perforation or abscess without bleeding 10/18/2015  . Esophagitis 10/18/2015  . Fibromyalgia 10/18/2015  . Candidiasis of breast 04/28/2015  . Candida glabrata infection 04/28/2015  . Long-term use of high-risk medication 04/13/2014  . History of high risk medication treatment 04/13/2014  . Lumbar compression fracture ([place]) 02/13/2014  . Compression fracture of L5 lumbar vertebra ([place]) 01/25/2014  . Compression fracture of lumbar vertebra ([place]) 01/25/2014  . Medicare annual wellness visit, subsequent 09/28/2012  . Encounter for general adult medical examination without abnormal findings 09/28/2012  . History of thyroid nodule 06/13/2012  . Benign colonic polyp 06/13/2012  . History of thyroid disease 06/13/2012  .  OSA on CPAP 06/06/2012  . Cerebrovascular disease 06/04/2012  . Hypertension 06/04/2012  . Hyperlipidemia 06/04/2012  . Osteoporosis 06/04/2012    Jerl Mina ,PT, DPT, E-RYT  10/01/2016, 10:07 AM  [place] MAIN [facility] SERVICES [address] [place], [place], [address] Phone: [phone]   Fax:  [phone]  Name: Camauri Fleece MRN: [id number] Date of Birth: 1950-11-22

## 2016-10-01 NOTE — Assessment & Plan Note (Signed)
Managed with Crestor 40 mg daily.  LFTs normal.  No changes today   Lab Results  Component Value Date   CHOL 112 09/29/2016   HDL 39.80 09/29/2016   LDLCALC 56 09/29/2016   TRIG 81.0 09/29/2016   CHOLHDL 3 09/29/2016   Lab Results  Component Value Date   ALT 13 09/29/2016   AST 21 09/29/2016   ALKPHOS 43 09/29/2016   BILITOT 0.6 09/29/2016

## 2016-10-01 NOTE — Assessment & Plan Note (Signed)
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

## 2016-10-01 NOTE — Assessment & Plan Note (Signed)
With prior thoracic and lumbar fractures,  Continue semi annual Prolia injection , next treatment due  IN jULY

## 2016-10-02 ENCOUNTER — Encounter: Payer: Self-pay | Admitting: Internal Medicine

## 2016-10-07 ENCOUNTER — Ambulatory Visit: Payer: Medicare Other | Admitting: Physical Therapy

## 2016-10-07 DIAGNOSIS — R262 Difficulty in walking, not elsewhere classified: Secondary | ICD-10-CM

## 2016-10-07 DIAGNOSIS — R296 Repeated falls: Secondary | ICD-10-CM | POA: Diagnosis not present

## 2016-10-07 NOTE — Therapy (Signed)
[place] MAIN [facility] SERVICES [address] [place], [place], [address] Phone: [phone]   Fax:  [phone]  Physical Therapy Treatment  Patient Details  Name: Ellen Hamilton MRN: [id number] Date of Birth: Apr 11, 1951 Referring Provider: recurrent falls  Encounter Date: 10/07/2016      PT End of Session - 10/07/16 1253    Visit Number 19   Number of Visits 25   Date for PT Re-Evaluation 10/31/16   Authorization Type Needs G codes    Authorization Time Period 19/20   PT Start Time 0945   PT Stop Time 1010   PT Time Calculation (min) 25 min   Equipment Utilized During Treatment Gait belt   Activity Tolerance Patient tolerated treatment well   Behavior During Therapy [facility] for tasks assessed/performed      Past Medical History:  Diagnosis Date  . Compression fracture of L5 lumbar vertebra ([place]) 01/2014  . Fibromyalgia   . GERD (gastroesophageal reflux disease)   . History of transient ischemic attack (TIA) 7654,6503  . Hyperlipidemia   . Hypertension   . Osteoporosis   . Sleep apnea 2009   sleep study done in [place]  . Stroke [facility]) [phone]   pt has had two    Past Surgical History:  Procedure Laterality Date  . BACK SURGERY    . BUNIONECTOMY Bilateral [id number]  . CATARACT EXTRACTION W/ INTRAOCULAR LENS  IMPLANT, BILATERAL  2010  . EYE SURGERY    . kyphoplaasty  2010  . KYPHOPLASTY N/A 02/13/2014   Procedure: KYPHOPLASTY LUMBAR FIVE;  Surgeon: Charlie Pitter, MD;  Location: [place] NEURO ORS;  Service: Neurosurgery;  Laterality: N/A;  . LAPAROSCOPIC APPENDECTOMY N/A 01/11/2016   Procedure: APPENDECTOMY LAPAROSCOPIC;  Surgeon: Florene Glen, MD;  Location: [facility] ORS;  Service: General;  Laterality: N/A;    There were no vitals filed for this visit.      Subjective Assessment - 10/07/16 1243    Subjective Pt reports her back pain persisted after the last session and she could not move her body for 3 days. She slowly was  able to get back to her exercises gradually. Pt felt she lost her confidence to walk and does not want to do the exercise where she pushed at the waist (perturbation training). She also does not do the cool down exercise seated on the noodle  by the wall because it caused the back pain.     Patient is accompained by: Family member   Pertinent History Patient states she had her appendix out in September 2017. Patient and her husband state that patient has had a decline in her mobility and balance since the surgery. Patient reports she has had 3-4 falls in the past six months and several near miss falls. Patient states she had a fall in the kitchen when she turned to reach for something. Patient states she cannot do quick turns. Patient states she is afraid of falling and states she feels she does not have the same stability as she did before. Patient feels her gait has deteriorted and is using her cane all the time. They report that the patient is not picking up her feet and catches her left toe instead of heel strike first. Patient exercises daily including going to the pool 5 days a week, doing the Nustep machine for 15-20 minutes daily, treadmill walking 7 minutes and sled. In addition, patient does Theraband exercises and works on high stepping and ladder work. Patient does  about 15-20 minutes of these exercises. Patient states she had a kyphoplasty due to a compression fracture from straining using the restroom in August 2015 and the surgery was October 2015. Patient has a history of multiple compression fractures involving T8, T12 and L5 per patient and husband report. Patient has a history of osteoporosis and patient suffered from TIA in 2008 and stroke in 2009 affecting primarily her right leg. Patient states she had a kyphoplasty due to a compression fracture from straining using the restroom in August 2015 and the surgery was October 2015. Patient has a history of multiple compression fractures involving  T8, T12 and L5 per patient and husband report. Patient has a history of osteoporosis and patient suffered from TIA in 2008 and stroke in 2009 affecting primarily her right leg.     Patient Stated Goals patient states she would like to be able to walk better, be able to turn around better and to have improved balance                     Adult Aquatic Therapy - 10/07/16 1251      Aquatic Therapy Subjective   Subjective pt had complained of her legs getting tired with 3rd lap of shoulder depression/ walking exercise. Pt was seated to perform chest rows. Later pt performed walking and reported feeling better but still noticed fatigue in her legs. Session ended to minimize overfatigue of her legs.          O: Pt entered/exited the pool via steps with B UE support on rail.  50 ft =1 lap  Exercises performed in 3'6" depth   Forward walking with noodle under water pushing noodle at waist level for scapular retraction/depression 1 laps 2nd lap modified to have noodle higher under armpit for less resistance   Seated chest rows 20 repsto downgrade exercise 2/2 pt's report of leg fatigue   Plantigrade at bench: Minimal thoracic ext despite cues. Demo'd serratus anterior mm activated Glut ext with cue to minimize lumbar lordosis: 5 deg, 10 reps B   Sideplank at bench:  Overuse of QL mm  Glut abd with shoulder depression  5 reps   Exercises performed in 4'0" depth   sidestepping L/R for cool down                 PT Long Term Goals - 09/05/16 1148      PT LONG TERM GOAL #1   Title Patient will reduce falls risk as indicated by Activities Specific Balance Confidence Scale (ABC) >67% by 08/08/2016.   Baseline scored 33% on 06/13/2016., 07/15/16: 33.4%; scored 42.5% on 08/19/2016; scored 54% on 09/05/2016   Time 8   Period Weeks   Status Partially Met     PT LONG TERM GOAL #2   Title Patient will be able to perform home program independently for self-management.    Time 8   Period Weeks   Status Achieved     PT LONG TERM GOAL #3   Title Patient will improve self-selected gait speed to 0.7 m/s or greater on 10 m walk to indicate reduced falls risk, improved community mobility and independence with ADL's.   Baseline average gait speed on 06/13/2016 was 0.59 m/sec, 07/15/16: 0.81 m/s; scored 0.83 m/sec on 08/19/2016; scored 0.6 m/sec on 4/27   Time 8   Period Weeks   Status Achieved     PT LONG TERM GOAL #4   Title Patient will score less than 14  seconds on the TUG test to indicate reduced falls risk.    Baseline scored 19 sec on 06/13/2016, 07/15/16: 13.8 seconds; scored 17 seconds on 08/19/2016; scored 13/15 seconds on 09/05/2016   Time 8   Period Weeks   Status Partially Met     PT LONG TERM GOAL #5   Title Patient will reduce falls risk as indicated by Merrilee Jansky Balance Scale Score of 49/56 or greater.   Baseline scored 42/56 on 06/13/2016; 07/15/16: 51/56; scored 55/56 on 09/05/2016   Time 8   Period Weeks   Status Achieved     PT LONG TERM GOAL #6   Title Patient will have improved score of 9 or more points on the lower extremilty functional scale in order to improve patients ability to perform ADLs.   Baseline scored 26/80 on 06/16/2016; 07/15/16: 16/80; scored 26/80 on 08/19/2016; scored 32/80 on 09/05/2016   Time 8   Period Weeks   Status Partially Met     PT LONG TERM GOAL #7   Title Patient will reduce falls risk as indicated by Merrilee Jansky Balance Scale Score of 54/56 or greater.   Baseline 07/15/16: 51/56; scored 55/56 on 09/05/2016   Time 8   Period Weeks   Status Achieved     PT LONG TERM GOAL #8   Title Patient will be independent in aquatic based home exercise program to help maintain patient's functional status and functional independence by 09/30/2016.   Time 6   Period Weeks   Status Deferred               Plan - 10/07/16 1254    Clinical Impression Statement Pt tolerated 25 min of PT today because she had already performed 20 min of warm ups  in the pool and later reported leg fatigue following upgrade of posterior chain strengthening exercises. Static stretches are less tolerable while range of motion and gentle movements fare better for pt in order to minimize mm fatigue/tightness. To minimize overfatigue which occurred last session, exercises today were modified and abbreviated with collaboration with patient's Sx and interest.  Pt demo'd good carry over with stairclmbing with more PF but required cuing for more anterior COM. Pt voiced understanding. Pt continues to benefit from skilled PT.    Rehab Potential Fair   Clinical Impairments Affecting Rehab Potential Positive indicators: motivated, family support     Negative indicators: prior CVA with residual deficits primarily affecting right LE   PT Frequency 2x / week   PT Duration 6 weeks   PT Treatment/Interventions Balance training;Therapeutic exercise;Therapeutic activities;Gait training;Stair training;Neuromuscular re-education;Patient/family education;Functional mobility training;Aquatic Therapy;Manual techniques   PT Home Exercise Plan sidelying clam shells, bridging, SLR (start with R knee flexed until she can perform through full range), slow marching with faded UEs assist, reverse clam shells with theraband resistance, seated theraband resisted hip abd,add and flexion. In the pool working on quick alternating high knee marching and simulated "kick ball" focusing on speed and strength of kicking. working on right/ left turning.   Consulted and Agree with Plan of Care Patient;Family member/caregiver   Family Member Consulted husband- Fadumo Heng      Patient will benefit from skilled therapeutic intervention in order to improve the following deficits and impairments:  Decreased balance, Difficulty walking, Decreased strength, Decreased range of motion, Decreased mobility, Pain  Visit Diagnosis: Difficulty in walking, not elsewhere classified  Repeated falls     Problem  List Patient Active Problem List   Diagnosis Date Noted  .  Influenza A 05/27/2016  . Acne rosacea 01/15/2016  . Colonic diverticular disease 01/15/2016  . Obesity 01/15/2016  . S/P appendectomy   . Acne erythematosa 10/18/2015  . Adiposity 10/18/2015  . Allergic rhinitis 10/18/2015  . Diverticulosis of large intestine without perforation or abscess without bleeding 10/18/2015  . Esophagitis 10/18/2015  . Fibromyalgia 10/18/2015  . Long-term use of high-risk medication 04/13/2014  . History of high risk medication treatment 04/13/2014  . Medicare annual wellness visit, subsequent 09/28/2012  . Encounter for general adult medical examination without abnormal findings 09/28/2012  . History of thyroid nodule 06/13/2012  . Benign colonic polyp 06/13/2012  . History of thyroid disease 06/13/2012  . OSA on CPAP 06/06/2012  . Cerebrovascular disease 06/04/2012  . Hypertension 06/04/2012  . Hyperlipidemia 06/04/2012  . Osteoporosis 06/04/2012    Jerl Mina ,PT, DPT, E-RYT  10/07/2016, 12:59 PM  [place] MAIN [facility] SERVICES [address] [place], [place], [address] Phone: [phone]   Fax:  [phone]  Name: Odalis Jordan MRN: [id number] Date of Birth: January 17, 1951

## 2016-10-09 ENCOUNTER — Ambulatory Visit: Payer: Medicare Other | Admitting: Physical Therapy

## 2016-10-09 DIAGNOSIS — R262 Difficulty in walking, not elsewhere classified: Secondary | ICD-10-CM | POA: Diagnosis not present

## 2016-10-09 DIAGNOSIS — R296 Repeated falls: Secondary | ICD-10-CM

## 2016-10-09 NOTE — Therapy (Signed)
[place] MAIN [facility] SERVICES [address] [place], [place], [address] Phone: [phone]   Fax:  [phone]  Physical Therapy Treatment / Discharge Summary   Patient Details  Name: Ellen Hamilton MRN: [id number] Date of Birth: August 14, 1950 Referring Provider: recurrent falls  Encounter Date: 10/09/2016      PT End of Session - 10/09/16 1028    Visit Number 20   Number of Visits 25   Date for PT Re-Evaluation 10/31/16   Authorization Type Needs G codes    Authorization Time Period 20/20   Equipment Utilized During Treatment Gait belt   Activity Tolerance Patient tolerated treatment well   Behavior During Therapy [facility] for tasks assessed/performed      Past Medical History:  Diagnosis Date  . Compression fracture of L5 lumbar vertebra ([place]) 01/2014  . Fibromyalgia   . GERD (gastroesophageal reflux disease)   . History of transient ischemic attack (TIA) 2263,3354  . Hyperlipidemia   . Hypertension   . Osteoporosis   . Sleep apnea 2009   sleep study done in [place]  . Stroke [facility]) [phone]   pt has had two    Past Surgical History:  Procedure Laterality Date  . BACK SURGERY    . BUNIONECTOMY Bilateral [id number]  . CATARACT EXTRACTION W/ INTRAOCULAR LENS  IMPLANT, BILATERAL  2010  . EYE SURGERY    . kyphoplaasty  2010  . KYPHOPLASTY N/A 02/13/2014   Procedure: KYPHOPLASTY LUMBAR FIVE;  Surgeon: Charlie Pitter, MD;  Location: [place] NEURO ORS;  Service: Neurosurgery;  Laterality: N/A;  . LAPAROSCOPIC APPENDECTOMY N/A 01/11/2016   Procedure: APPENDECTOMY LAPAROSCOPIC;  Surgeon: Florene Glen, MD;  Location: [facility] ORS;  Service: General;  Laterality: N/A;    There were no vitals filed for this visit.      Subjective Assessment - 10/09/16 1024    Subjective Pt reported she felt only soreness in her legs but no pain after last session.    Patient is accompained by: Family member   Pertinent History Patient states she had her  appendix out in September 2017. Patient and her husband state that patient has had a decline in her mobility and balance since the surgery. Patient reports she has had 3-4 falls in the past six months and several near miss falls. Patient states she had a fall in the kitchen when she turned to reach for something. Patient states she cannot do quick turns. Patient states she is afraid of falling and states she feels she does not have the same stability as she did before. Patient feels her gait has deteriorted and is using her cane all the time. They report that the patient is not picking up her feet and catches her left toe instead of heel strike first. Patient exercises daily including going to the pool 5 days a week, doing the Nustep machine for 15-20 minutes daily, treadmill walking 7 minutes and sled. In addition, patient does Theraband exercises and works on high stepping and ladder work. Patient does about 15-20 minutes of these exercises. Patient states she had a kyphoplasty due to a compression fracture from straining using the restroom in August 2015 and the surgery was October 2015. Patient has a history of multiple compression fractures involving T8, T12 and L5 per patient and husband report. Patient has a history of osteoporosis and patient suffered from TIA in 2008 and stroke in 2009 affecting primarily her right leg. Patient states she had a kyphoplasty due to a  compression fracture from straining using the restroom in August 2015 and the surgery was October 2015. Patient has a history of multiple compression fractures involving T8, T12 and L5 per patient and husband report. Patient has a history of osteoporosis and patient suffered from TIA in 2008 and stroke in 2009 affecting primarily her right leg.     Patient Stated Goals patient states she would like to be able to walk better, be able to turn around better and to have improved balance                     Adult Aquatic Therapy -  10/09/16 1028      Aquatic Therapy Subjective   Subjective pt had no complaints       O: Pt entered/exited the pool via steps with single UE support on rail.  50 ft =1 lap  Exercises performed in 3'6" depth   Pt performed 10 min of warm up that was not billed.   Backward walking with alignment and propioception training 3'6"  COM over front shin (lunge position) , eccentrol of back heel (to correct posterior COM ). Educated about principle of counterforce to correct balance with perturbation challenges  2 laps   COM over back leg for pulling application 4'0" depth Resisted force of PT pulling noodle (tug a war) Cued for inititation of wieghbearing in feet, back leg and then COM, then arms  1 lap  1/2 lap without PT assistance, use of lane rope   tricep strengthening with noodle back back  2 lap forward walking    Standing marches 30 sec x 90 sec rest with stretches  1 set with tricep strengthening with noodle back back  2 sets with lat strengthening with noodle at waist    Stair climbing with single UE on rail, 3 heel raises , cued for COM over front shin  4 steps,   Stair descent with cues for plantarflexion eccentric control 4 steps    Step platform in pool 3'6"  Single UE on rail  10 reps step up with plantarflexion first in back leg, hip abd to minimize genu valgus in front leg  10 reps step down with cues tactile and verbal for hip abd to minimize genu valgus      walking cool down                  PT Long Term Goals - 10/09/16 1029      PT LONG TERM GOAL #1   Title Patient will reduce falls risk as indicated by Activities Specific Balance Confidence Scale (ABC) >67% by 08/08/2016.   Baseline scored 33% on 06/13/2016., 07/15/16: 33.4%; scored 42.5% on 08/19/2016; scored 54% on 09/05/2016   Time 8   Period Weeks   Status Unable to assess     PT LONG TERM GOAL #2   Title Patient will be able to perform home program independently for  self-management.   Time 8   Period Weeks   Status Achieved     PT LONG TERM GOAL #3   Title Patient will improve self-selected gait speed to 0.7 m/s or greater on 10 m walk to indicate reduced falls risk, improved community mobility and independence with ADL's.   Baseline average gait speed on 06/13/2016 was 0.59 m/sec, 07/15/16: 0.81 m/s; scored 0.83 m/sec on 08/19/2016; scored 0.6 m/sec on 4/27   Time 8   Period Weeks   Status Achieved     PT LONG TERM  GOAL #4   Title Patient will score less than 14 seconds on the TUG test to indicate reduced falls risk.    Baseline scored 19 sec on 06/13/2016, 07/15/16: 13.8 seconds; scored 17 seconds on 08/19/2016; scored 13/15 seconds on 09/05/2016   Time 8   Period Weeks   Status Unable to assess     PT LONG TERM GOAL #5   Title Patient will reduce falls risk as indicated by Merrilee Jansky Balance Scale Score of 49/56 or greater.   Baseline scored 42/56 on 06/13/2016; 07/15/16: 51/56; scored 55/56 on 09/05/2016   Time 8   Period Weeks   Status Achieved     PT LONG TERM GOAL #6   Title Patient will have improved score of 9 or more points on the lower extremilty functional scale in order to improve patients ability to perform ADLs.   Baseline scored 26/80 on 06/16/2016; 07/15/16: 16/80; scored 26/80 on 08/19/2016; scored 32/80 on 09/05/2016   Time 8   Period Weeks   Status Unable to assess     PT LONG TERM GOAL #7   Title Patient will reduce falls risk as indicated by Merrilee Jansky Balance Scale Score of 54/56 or greater.   Baseline 07/15/16: 51/56; scored 55/56 on 09/05/2016   Time 8   Period Weeks   Status Achieved     PT LONG TERM GOAL #8   Title Patient will be independent in aquatic based home exercise program to help maintain patient's functional status and functional independence by 09/30/2016.   Time 6   Period Weeks   Status Achieved               Plan - 2016/10/26 1029    Clinical Impression Statement Pt achieved 5/7 goals and is ready for d/c. Pt has been  able to walk for 45 min around her neighborhood on uneven grounds and incline. This activity is something she has not been able to do in a long time she reports. Pt demo'd increased static and dynamic stability with stepping and weightshift strategies as well as step-through pattern when ascending and descending steps with single UE on rail.  Pt is also demo'd improved upright posture and speed with gait. Pt is ready to be d/c at this time.    Rehab Potential Fair   Clinical Impairments Affecting Rehab Potential Positive indicators: motivated, family support     Negative indicators: prior CVA with residual deficits primarily affecting right LE   PT Frequency 2x / week   PT Duration 6 weeks   PT Treatment/Interventions Balance training;Therapeutic exercise;Therapeutic activities;Gait training;Stair training;Neuromuscular re-education;Patient/family education;Functional mobility training;Aquatic Therapy;Manual techniques   PT Home Exercise Plan sidelying clam shells, bridging, SLR (start with R knee flexed until she can perform through full range), slow marching with faded UEs assist, reverse clam shells with theraband resistance, seated theraband resisted hip abd,add and flexion. In the pool working on quick alternating high knee marching and simulated "kick ball" focusing on speed and strength of kicking. working on right/ left turning.   Consulted and Agree with Plan of Care Patient;Family member/caregiver   Family Member Consulted husband- Darsha Zumstein      Patient will benefit from skilled therapeutic intervention in order to improve the following deficits and impairments:  Decreased balance, Difficulty walking, Decreased strength, Decreased range of motion, Decreased mobility, Pain  Visit Diagnosis: Difficulty in walking, not elsewhere classified  Repeated falls       G-Codes - 2016-10-26 1039    Functional Assessment Tool  Used (Outpatient Only) clinical judgement   Functional Limitation  Mobility: Walking and moving around   Mobility: Walking and Moving Around Current Status [phone]) At least 20 percent but less than 40 percent impaired, limited or restricted   Mobility: Walking and Moving Around Discharge Status [phone]) At least 20 percent but less than 40 percent impaired, limited or restricted      Problem List Patient Active Problem List   Diagnosis Date Noted  . Influenza A 05/27/2016  . Acne rosacea 01/15/2016  . Colonic diverticular disease 01/15/2016  . Obesity 01/15/2016  . S/P appendectomy   . Acne erythematosa 10/18/2015  . Adiposity 10/18/2015  . Allergic rhinitis 10/18/2015  . Diverticulosis of large intestine without perforation or abscess without bleeding 10/18/2015  . Esophagitis 10/18/2015  . Fibromyalgia 10/18/2015  . Long-term use of high-risk medication 04/13/2014  . History of high risk medication treatment 04/13/2014  . Medicare annual wellness visit, subsequent 09/28/2012  . Encounter for general adult medical examination without abnormal findings 09/28/2012  . History of thyroid nodule 06/13/2012  . Benign colonic polyp 06/13/2012  . History of thyroid disease 06/13/2012  . OSA on CPAP 06/06/2012  . Cerebrovascular disease 06/04/2012  . Hypertension 06/04/2012  . Hyperlipidemia 06/04/2012  . Osteoporosis 06/04/2012    Jerl Mina 10/09/2016, 10:41 AM  [place] MAIN [facility] SERVICES [address] [place], [place], [address] Phone: [phone]   Fax:  [phone]  Name: Ellen Hamilton MRN: [id number] Date of Birth: [birth year]

## 2016-11-13 DIAGNOSIS — D229 Melanocytic nevi, unspecified: Secondary | ICD-10-CM | POA: Diagnosis not present

## 2016-11-13 DIAGNOSIS — L578 Other skin changes due to chronic exposure to nonionizing radiation: Secondary | ICD-10-CM | POA: Diagnosis not present

## 2016-11-13 DIAGNOSIS — L820 Inflamed seborrheic keratosis: Secondary | ICD-10-CM | POA: Diagnosis not present

## 2016-11-13 DIAGNOSIS — L821 Other seborrheic keratosis: Secondary | ICD-10-CM | POA: Diagnosis not present

## 2016-11-25 ENCOUNTER — Ambulatory Visit (INDEPENDENT_AMBULATORY_CARE_PROVIDER_SITE_OTHER): Payer: Medicare Other | Admitting: *Deleted

## 2016-11-25 DIAGNOSIS — M818 Other osteoporosis without current pathological fracture: Secondary | ICD-10-CM | POA: Diagnosis not present

## 2016-11-25 MED ORDER — DENOSUMAB 60 MG/ML ~~LOC~~ SOLN
60.0000 mg | Freq: Once | SUBCUTANEOUS | Status: AC
  Administered 2016-11-25: 60 mg via SUBCUTANEOUS

## 2016-11-25 NOTE — Progress Notes (Addendum)
Patient presented for 6 month Prolia injection to left arm Sob-Q, patient voiced no complaints or concerns during injection.  Noted, Per tullo's  Last note, Due in July. Margaret arnett np

## 2016-12-25 DIAGNOSIS — L720 Epidermal cyst: Secondary | ICD-10-CM | POA: Diagnosis not present

## 2016-12-25 DIAGNOSIS — D485 Neoplasm of uncertain behavior of skin: Secondary | ICD-10-CM | POA: Diagnosis not present

## 2017-01-27 ENCOUNTER — Ambulatory Visit (INDEPENDENT_AMBULATORY_CARE_PROVIDER_SITE_OTHER): Payer: Medicare Other | Admitting: Internal Medicine

## 2017-01-27 ENCOUNTER — Encounter: Payer: Self-pay | Admitting: Internal Medicine

## 2017-01-27 DIAGNOSIS — G4733 Obstructive sleep apnea (adult) (pediatric): Secondary | ICD-10-CM | POA: Diagnosis not present

## 2017-01-27 DIAGNOSIS — Z9989 Dependence on other enabling machines and devices: Secondary | ICD-10-CM | POA: Diagnosis not present

## 2017-01-27 DIAGNOSIS — I63312 Cerebral infarction due to thrombosis of left middle cerebral artery: Secondary | ICD-10-CM

## 2017-01-27 DIAGNOSIS — I10 Essential (primary) hypertension: Secondary | ICD-10-CM | POA: Diagnosis not present

## 2017-01-27 MED ORDER — ZOSTER VAC RECOMB ADJUVANTED 50 MCG/0.5ML IM SUSR: 0.5000 mL | Freq: Once | INTRAMUSCULAR | 1 refills | Status: AC

## 2017-01-27 NOTE — Progress Notes (Signed)
Subjective:  Patient ID: Ellen Hamilton, female    DOB: [birth year]  Age: 66 y.o. MRN: [id number]  CC: Diagnoses of Essential hypertension and OSA on CPAP were pertinent to this visit.  HPI [name] presents for follow up on hypertension , sequelae of CVA  With residual right sided weakness , osteoporosis with prior vertebral fracture,   Exercising daily using water aerobics,  And gym.   No falls.    No persistent back pain,  Uses tylenol  prn  Home bps have been 120/80 or less on amlodipine and metoprolol  Moving to [facility] in a week.  Apt for a year,  Then retirement community  Move date next week .      Received flu vaccines last week  Outpatient Medications Prior to Visit  Medication Sig Dispense Refill  . amLODipine (NORVASC) 5 MG tablet Take 1 tablet (5 mg total) by mouth daily. 90 tablet 3  . CALCIUM CITRATE PO Take 500 mg by mouth 3 (three) times daily.    . cholecalciferol (VITAMIN D) 1000 UNITS tablet Take 1,000 Units by mouth daily.    . clopidogrel (PLAVIX) 75 MG tablet Take 1 tablet (75 mg total) by mouth daily. 90 tablet 2  . denosumab (PROLIA) 60 MG/ML SOLN injection Inject 60 mg into the skin every 6 (six) months. Administer in upper arm, thigh, or abdomen    . fish oil-omega-3 fatty acids 1000 MG capsule Take 1,000 mg by mouth daily.    Marland Kitchen ibuprofen (ADVIL,MOTRIN) 200 MG tablet Take 400 mg by mouth 2 (two) times daily.    . metoprolol succinate (TOPROL-XL) 100 MG 24 hr tablet Take 1 tablet (100 mg total) by mouth daily. Take with or immediately following a meal. 90 tablet 3  . Multiple Vitamin (MULTIVITAMIN) tablet Take 1 tablet by mouth daily.    . ranitidine (ZANTAC) 150 MG tablet Take 1 tablet (150 mg total) by mouth daily. 90 tablet 3  . rosuvastatin (CRESTOR) 40 MG tablet Take 1 tablet (40 mg total) by mouth daily. 90 tablet 3  . vitamin B-12 (CYANOCOBALAMIN) 1000 MCG tablet Take 1,000 mcg by mouth every other day.     No  facility-administered medications prior to visit.     Review of Systems;  Patient denies headache, fevers, malaise, unintentional weight loss, skin rash, eye pain, sinus congestion and sinus pain, sore throat, dysphagia,  hemoptysis , cough, dyspnea, wheezing, chest pain, palpitations, orthopnea, edema, abdominal pain, nausea, melena, diarrhea, constipation, flank pain, dysuria, hematuria, urinary  Frequency, nocturia, numbness, tingling, seizures,  Focal weakness, Loss of consciousness,  Tremor, insomnia, depression, anxiety, and suicidal ideation.      Objective:  BP 118/86 (BP Location: Left Arm, Patient Position: Sitting, Cuff Size: Normal)   Pulse 68   Temp 98.3 F (36.8 C) (Oral)   Resp 15   Ht 5\' 3"  (1.6 m)   Wt 172 lb 6.4 oz (78.2 kg)   SpO2 97%   BMI 30.54 kg/m   BP Readings from Last 3 Encounters:  01/27/17 118/86  09/29/16 124/88  08/13/16 128/70    Wt Readings from Last 3 Encounters:  01/27/17 172 lb 6.4 oz (78.2 kg)  09/29/16 173 lb 9.6 oz (78.7 kg)  08/13/16 175 lb 12.8 oz (79.7 kg)    General appearance: alert, cooperative and appears stated age Ears: normal TM's and external ear canals both ears Throat: lips, mucosa, and tongue normal; teeth and gums normal Neck: no adenopathy, no carotid bruit,  supple, symmetrical, trachea midline and thyroid not enlarged, symmetric, no tenderness/mass/nodules Back: symmetric, no curvature. ROM normal. No CVA tenderness. Lungs: clear to auscultation bilaterally Heart: regular rate and rhythm, S1, S2 normal, no murmur, click, rub or gallop Abdomen: soft, non-tender; bowel sounds normal; no masses,  no organomegaly Pulses: 2+ and symmetric Skin: Skin color, texture, turgor normal. No rashes or lesions Lymph nodes: Cervical, supraclavicular, and axillary nodes normal.  No results found for: HGBA1C  Lab Results  Component Value Date   CREATININE 0.70 09/29/2016   CREATININE 0.59 01/11/2016   CREATININE 0.66 01/10/2016      Lab Results  Component Value Date   WBC 6.6 01/11/2016   HGB 13.6 01/11/2016   HCT 39.2 01/11/2016   PLT 167 01/11/2016   GLUCOSE 93 09/29/2016   CHOL 112 09/29/2016   TRIG 81.0 09/29/2016   HDL 39.80 09/29/2016   LDLCALC 56 09/29/2016   ALT 13 09/29/2016   AST 21 09/29/2016   NA 139 09/29/2016   K 3.9 09/29/2016   CL 102 09/29/2016   CREATININE 0.70 09/29/2016   BUN 12 09/29/2016   CO2 31 09/29/2016   TSH 4.26 10/01/2015    Dg Bone Density  Result Date: 04/22/2016 EXAM: DUAL X-RAY ABSORPTIOMETRY (DXA) FOR BONE MINERAL DENSITY IMPRESSION: Dear Dr Deborra Medina, Your patient [name] completed a BMD test on 04/22/2016 using the [place] (analysis version: 14.10) manufactured by [employer]. The following summarizes the results of our evaluation. PATIENT BIOGRAPHICAL: Name: Skarlette, Lattner Patient ID: [id number] Birth Date: 1951-03-25 Height: 61.5 in. Gender: Female Exam Date: 04/22/2016 Weight: 181.2 lbs. Indications: Advanced Age, Caucasian, Family History of Fracture, Height Loss, History of Fracture (Adult), POSTmenopausal Fractures: FINGER, RIBS, Spine Treatments: 81 MG ASPIRIN, Calcium, multivitamin, prolia, Vitamin D ASSESSMENT: The BMD measured at Femur Neck Left is 0.815 g/cm2 with a T-score of -1.6. This patient is considered osteopenic according to [facility]) criteria.Patient is not a candidate for FRAX assessment due to prolia use. Site Region Measured Measured WHO Young Adult BMD Date       Age      Classification T-score DualFemur Neck Left 04/22/2016 66.5 Osteopenia -1.6 0.815 g/cm2 DualFemur Neck Left 04/26/2014 64.5 Osteopenia -1.8 0.781 g/cm2 AP Spine L1-L4 04/22/2016 66.5 Normal -0.2 1.160 g/cm2 AP Spine L1-L4 04/26/2014 64.5 Normal -0.7 1.109 g/cm2 World Health Organization [facility]) criteria for post-menopausal, Caucasian Women: Normal:       T-score at or above -1 SD Osteopenia:   T-score between -1 and -2.5 SD  Osteoporosis: T-score at or below -2.5 SD RECOMMENDATIONS: [place] recommends that FDA-approved medical therapies be considered in postemenopausal women and men age 16 or older with a: 1. Hip or vertebral (clinical or morphometric) fracture. 2. T-score of < -2.5at the spine or hip. 3. Ten-year fracture probability by FRAX of 3% or greater for hip fracture or 20% or greater for major osteoporotic fracture. All treatment decisions require clinical judgment and consideration of individual patient factors, including patient preferences, co-morbidities, previous drug use, risk factors not captured in the FRAX model (e.g. falls, vitamin D deficiency, increased bone turnover, interval significant decline in bone density) and possible under - or over-estimation of fracture risk by FRAX. All patients should ensure an adequate intake of dietary calcium (1200 mg/d) and vitamin D (800 IU daily) unless contraindicated. FOLLOW-UP: People with diagnosed cases of osteoporosis or at high risk for fracture should have regular bone mineral density tests. For patients eligible  for Medicare, routine testing is allowed once every 2 years. The testing frequency can be increased to one year for patients who have rapidly progressing disease, those who are receiving or discontinuing medical therapy to restore bone mass, or have additional risk factors. I have reviewed this report, and agree with the above findings. [facility] Electronically Signed   By: Lahoma Crocker M.D.   On: 04/22/2016 14:26    Assessment & Plan:   Problem List Items Addressed This Visit    Hypertension    Well controlled on current regimen. Renal function stable, no changes today.  Lab Results  Component Value Date   CREATININE 0.70 09/29/2016   Lab Results  Component Value Date   NA 139 09/29/2016   K 3.9 09/29/2016   CL 102 09/29/2016   CO2 31 09/29/2016         OSA on CPAP    Diagnosed by sleep study. She is  wearing her CPAP every night a minimum of 6 hours per night and notes improved daytime wakefulness and decreased fatigue          I am having Ms. Ribble start on Zoster Vac Recomb Adjuvanted. I am also having her maintain her vitamin B-12, cholecalciferol, fish oil-omega-3 fatty acids, multivitamin, CALCIUM CITRATE PO, ibuprofen, amLODipine, rosuvastatin, ranitidine, metoprolol succinate, denosumab, and clopidogrel.  Meds ordered this encounter  Medications  . Zoster Vac Recomb Adjuvanted [facility]) injection    Sig: Inject 0.5 mLs into the muscle once.    Dispense:  1 each    Refill:  1    There are no discontinued medications.  Follow-up: No Follow-up on file.   Crecencio Mc, MD

## 2017-01-27 NOTE — Patient Instructions (Addendum)
Mammogram  was August 04 2016  DEXA was December 2017  Last Prolia injection was  In  July in the left arm    The ShingRx vaccine is now available in local pharmacies and is much more protective thant Zostavaxs,  It is therefore ADVISED for all interested adults over 50 to prevent shingles

## 2017-01-29 DIAGNOSIS — E785 Hyperlipidemia, unspecified: Secondary | ICD-10-CM | POA: Diagnosis not present

## 2017-01-29 DIAGNOSIS — I10 Essential (primary) hypertension: Secondary | ICD-10-CM | POA: Diagnosis not present

## 2017-01-29 LAB — BASIC METABOLIC PANEL
BUN: 11 (ref 4–21)
CREATININE: 0.8 (ref 0.5–1.1)
Glucose: 86
Potassium: 4.5 (ref 3.4–5.3)
Sodium: 141 (ref 137–147)

## 2017-01-29 LAB — HEPATIC FUNCTION PANEL
ALK PHOS: 52 (ref 25–125)
ALT: 14 (ref 7–35)
AST: 26 (ref 13–35)
Bilirubin, Total: 0.3

## 2017-01-29 LAB — LIPID PANEL
CHOLESTEROL: 124 (ref 0–200)
HDL: 49 (ref 35–70)
LDL Cholesterol: 60
Triglycerides: 75 (ref 40–160)

## 2017-01-29 NOTE — Assessment & Plan Note (Signed)
Well controlled on current regimen. Renal function stable, no changes today.  Lab Results  Component Value Date   CREATININE 0.70 09/29/2016   Lab Results  Component Value Date   NA 139 09/29/2016   K 3.9 09/29/2016   CL 102 09/29/2016   CO2 31 09/29/2016

## 2017-01-29 NOTE — Assessment & Plan Note (Signed)
Diagnosed by sleep study. She is wearing her CPAP every night a minimum of 6 hours per night and notes improved daytime wakefulness and decreased fatigue  

## 2017-01-29 NOTE — Assessment & Plan Note (Signed)
Managed with Crestor 40 mg daily.  LFTs were normal in May a nd will be repeated next week.   Lab Results  Component Value Date   CHOL 112 09/29/2016   HDL 39.80 09/29/2016   LDLCALC 56 09/29/2016   TRIG 81.0 09/29/2016   CHOLHDL 3 09/29/2016   Lab Results  Component Value Date   ALT 13 09/29/2016   AST 21 09/29/2016   ALKPHOS 43 09/29/2016   BILITOT 0.6 09/29/2016

## 2017-02-03 DIAGNOSIS — Z23 Encounter for immunization: Secondary | ICD-10-CM | POA: Diagnosis not present

## 2017-02-04 NOTE — Telephone Encounter (Signed)
error 

## 2017-02-05 NOTE — Telephone Encounter (Signed)
Orders

## 2017-02-05 NOTE — Telephone Encounter (Signed)
Error

## 2017-02-06 ENCOUNTER — Telehealth: Payer: Self-pay | Admitting: Internal Medicine

## 2017-02-06 ENCOUNTER — Other Ambulatory Visit: Payer: Self-pay

## 2017-02-06 NOTE — Telephone Encounter (Signed)
Your cholesterol, liver and kidney function are normal. Please abstract

## 2017-02-06 NOTE — Telephone Encounter (Signed)
Attempted to call pt. No answer, no voicemail.  

## 2017-02-13 ENCOUNTER — Ambulatory Visit: Payer: Self-pay | Admitting: Internal Medicine

## 2017-02-17 ENCOUNTER — Other Ambulatory Visit: Payer: Self-pay | Admitting: Internal Medicine

## 2017-03-13 DIAGNOSIS — I10 Essential (primary) hypertension: Secondary | ICD-10-CM | POA: Diagnosis not present

## 2017-03-13 DIAGNOSIS — Z6831 Body mass index (BMI) 31.0-31.9, adult: Secondary | ICD-10-CM | POA: Diagnosis not present

## 2017-03-13 DIAGNOSIS — M810 Age-related osteoporosis without current pathological fracture: Secondary | ICD-10-CM | POA: Diagnosis not present

## 2017-03-13 DIAGNOSIS — G4733 Obstructive sleep apnea (adult) (pediatric): Secondary | ICD-10-CM | POA: Diagnosis not present

## 2017-03-13 DIAGNOSIS — Z8673 Personal history of transient ischemic attack (TIA), and cerebral infarction without residual deficits: Secondary | ICD-10-CM | POA: Diagnosis not present

## 2017-03-23 ENCOUNTER — Telehealth (HOSPITAL_BASED_OUTPATIENT_CLINIC_OR_DEPARTMENT_OTHER): Payer: Self-pay | Admitting: Neurology

## 2017-03-23 NOTE — Telephone Encounter (Signed)
(  TEXTING IS AN OPTION FOR [facility] CLINICS ONLY)  Is this a [facility] clinic? No      RETURN CALL: Detailed message on voicemail only      SUBJECT:  Referral Request      REFERRING PROVIDER: Elzie RingsBecker, Kyra J  SYMPTOM(S)/DIAGNOSIS: Stroke    CLINIC AND LOCATION: [facility] , [place]  NAME OF PROVIDER: Unknown  TYPE OF SPECIALTY: Stroke Specialist  CLINIC PHONE: Unknown CLINIC FAX: Unknown  HAS THE REFERRAL ALREADY BEEN REQUESTED?: No  ADDITIONAL INFORMATION: Patient was seen by Dr. Donalee CitrinBecker for 3 years but moved to [place]. Patient's husband would like to know if Dr. Donalee CitrinBecker is able to refer them to [facility] stroke [facility] or some [facility] [place]. If this is not possible, Dr. Donalee CitrinBecker can send [facility] a [facility] and [place] Please call patient's husband. Thanks!

## 2017-03-26 ENCOUNTER — Telehealth (HOSPITAL_BASED_OUTPATIENT_CLINIC_OR_DEPARTMENT_OTHER): Payer: Self-pay | Admitting: Neurology

## 2017-03-26 NOTE — Telephone Encounter (Signed)
I left a VM letting the patients husband know that Dr. Donalee CitrinBecker is no longer seeing pt.'s in the Stroke clinic since 2016.

## 2017-05-28 DIAGNOSIS — M810 Age-related osteoporosis without current pathological fracture: Secondary | ICD-10-CM | POA: Diagnosis not present

## 2017-05-29 DIAGNOSIS — I10 Essential (primary) hypertension: Secondary | ICD-10-CM | POA: Diagnosis not present

## 2017-05-29 DIAGNOSIS — R9082 White matter disease, unspecified: Secondary | ICD-10-CM | POA: Diagnosis not present

## 2017-05-29 DIAGNOSIS — I6930 Unspecified sequelae of cerebral infarction: Secondary | ICD-10-CM | POA: Diagnosis not present

## 2017-05-29 DIAGNOSIS — R269 Unspecified abnormalities of gait and mobility: Secondary | ICD-10-CM | POA: Diagnosis not present

## 2017-06-05 DIAGNOSIS — R531 Weakness: Secondary | ICD-10-CM | POA: Diagnosis not present

## 2017-06-05 DIAGNOSIS — R2689 Other abnormalities of gait and mobility: Secondary | ICD-10-CM | POA: Diagnosis not present

## 2017-08-14 ENCOUNTER — Ambulatory Visit: Payer: Medicare Other

## 2017-12-06 ENCOUNTER — Emergency Department (HOSPITAL_BASED_OUTPATIENT_CLINIC_OR_DEPARTMENT_OTHER)
Admission: EM | Admit: 2017-12-06 | Discharge: 2017-12-06 | Disposition: A | Discharge status: Home / Self Care | Payer: Medicare Other | Attending: Family | Admitting: Family

## 2017-12-06 ENCOUNTER — Encounter (HOSPITAL_BASED_OUTPATIENT_CLINIC_OR_DEPARTMENT_OTHER): Payer: Self-pay | Admitting: Unknown Physician Specialty

## 2017-12-06 DIAGNOSIS — H04123 Dry eye syndrome of bilateral lacrimal glands: Secondary | ICD-10-CM

## 2017-12-06 DIAGNOSIS — I10 Essential (primary) hypertension: Secondary | ICD-10-CM | POA: Insufficient documentation

## 2017-12-06 NOTE — Progress Notes (Signed)
Chief complaint: itchiness and pain with right and left gazes right eye  HPI:  Stacey Schwartz is a 67 year old female who presents with 2 days of bilateral eye itching and R eye pain with right and left gazes. Initially pain was 8/10, thought this was allergy so she tried antihistamine drops and tylenol. Reports pain improved but still endorses R eye pain with right and left gaze. Denies f/f/c, diplopia. Pt has always been sensitive to light.    ROS: all other systems otherwise negative except as mentioned above..    Ocular Meds:   none    Systemic / Other Meds:    Aspirin  Plavix  Metoprolol  Amlodipine  Prolia  Ranitidine  Rosuvastatin    Past Ocular Hx:   CEIOL be 2010  Wear reading glasses    Past Medical History:    Fibromyalgia  TIA 2008 and 2209    Past Surgical History:  Foot surgery for bunion  Kyphoplasty  Vetebroplasty  Appendectomy    Allergies:   Penicillin (rash)    Ocular FamHx:   negative for glaucoma, AMD and retinal detachment.    SocHx:   Never smoker  Drinks 1 glass of wine at dinner  Never uses drugs of any kind  [place]ives in [place] with [place]  Retired    Physical Exam:   Eyes: See Eye Exam   Constitutional: Well-appearing  Psychiatric: pleasant affect, coorperative   Neurologic: AxOx3, no nystagmus  Skin: no facial erythema, edema, rashes, abrasions    Head: Normocephalic, atraumatic   ENT: External ears and nose are normal in appearance .   Respiratory: normal respiratory effort     Imaging:  None    Testing:  None    Assessment/Plan:    #Dry eye related CPAP use  -Recommend better eye seal on CPAP machine at night   -AT's four times per day, lubricating ointment at night    RTC PRN    Pt seen with Dr. Rochel Bromeavis    Evynn Boutelle T. Sharhonda Atwood, MD  PGY-2 Ophthalmology     ---------------------------------------------------------------------------  Ophthalmic abbreviations  OD/OS/OU - right eye, left eye, both eyes  PFATs - preservative free artificial tears  ATs - artificial tears (with preservatives  typically)  VA - visual acuity   sc/cc - without correction, with correction   LP/NLP - light perception / no light perception  DFE - dilated fundal exam  SPEE - superficial punctate epithelial erosions  OCT - optical coherence tomography   HVF - Humphrey visual field   CE - cataract extraction   IOL - intraocular lens   AC/PCIOL - anterior/posterior chamber intraocular lens  NSC/PSC - nuclear sclerosing-type cataract, posterior subcapsular cataract   NS - nuclear sclerosing  RD - retinal detachment  TRD/RRD/SRD - tractional or rhegmatogenous or serous retinal detachment   PPV/EL/SO/SB - pars plana vitrectomy, endolaser, silicone oil, scleral buckle    NVD/I/E - neovascularization of the disk, iris, elsewhere   DBH - dot blot heme  CWS - cotton wool spots

## 2018-01-10 NOTE — Progress Notes (Signed)
Vascular Neurology Consult    PCP: Sheral Flow, MD    HPI:  67 year old female w/ h/o HTN, OSA, HLD referred for chronic lacunar infarcts (L CR 2008, L PLIC 2009).  She was previously followed by [facility], last seen in Dec 2017.  Accompanied by husband.  She presents to re-establish care at [facility] stroke clinic after moving back to the [facility] from [facility].  She notes both prior infarcts resulted in acute onset RLE paresis. She has residual R leg weakness and uses a cane for ambulation. She request PT referral for gait/balance instability.  All prior imaging reviewed with the patient, including the MRI from December 2011 - which was notable for chronic appearing lacunar L CR infarct, numerous lobar>deep CMBs, mod PV WMD, and dilated PVS in deep grey nuclei.  She monitors at BP at home a few times per week - most readings are <130/80. She endorses CPAP compliance. She endorses medical compliance and denies medication side effects.     ROS:   Constitutional: Negative    Eyes: Negative    Ears, Nose, Mouth, Throat: Negative    Cardiovascular: Negative    Respiratory: Negative    Gastrointestinal: Negative   Genitourinary: Negative   Musculoskeletal: Negative    Skin: Negative    Neurological: As noted in HPI above   Psychiatric: Negative    Endocrine: Negative    Hematologic/Lymphatic: Negative   Allergic/Immunologic: Negative     PMH: HTN, HLD, osteoperosis, OSA, esophagitis, diverticulosis, thoracic vertebral compression fx 1996, 2010  PSH: kyphoplasty, vertebroplasty, appy  FH: neg for premature stroke/MI, mother w/ AD, brother w/ parkinsonsim, cousin w/ MS  SH: no tob/illicits, social etoh, lives w/ husband in retirement community  Meds: plavix 75, amlodipine 5, metop XL 100 daily, crestor 40 qhs, prolia  Allergies: bactrim, oxycodone, PCN    ROS, past medical history, past surgical history, family history and social history reviewed.    Exam:  Vitals:    01/12/18 0825 01/12/18 0826   BP: (Abnormal) 147/72 139/70      BP Cuff Size: Regular Regular   BP Site: Left Arm Right Arm   BP Position: Sitting Sitting   Pulse: 50 (Abnormal) 48   Temp: 97.5 F (36.4 C)    TempSrc: Temporal    SpO2: 97%    Weight: 176 lb 4.8 oz (80 kg)    Height: 5\' 3"  (1.6 m)      Gen: NAD  HEENT: normal, NC/AT  Neck: supple  CV: RR  Ext: wwp, no edema  Psych: affect appropriate    Neuro Examination  HCF: alert, attentive, language fluent, follows all commands, no aphasia/neglect/apraxia/agnosia, recent and remote memory preserved  CN: VFFTC, EOM full without nystagmus, facial strength full and symmetric, hearing intact to finger rub b/l, palate elevates symmetrically, tongue midline, trapezius/SCM full b/l  M: mild paratonia but no frank ridigity or spasticity noted in RLE or otw, mild RLE drift, 5/5 throughout proximally and distally in all extremities except RLE where HF 4+ / KE 5 / KF 4+, ADF 4+ / APF 5, FFM symm b/l but R foot taps slower  DTR: 2+ in the bilateral biceps, triceps, brachial radialis; patellar 3 on R and 2 on L, Achilles absent b/l; toes not assessed, no ankle clonus  C: FTN intact b/l w/o evident dysmetria  S/G: sl wide dtance, slowed and cautious ambulation with cane w/ reduced cadence, minimal flexion of R knee, multistep turns, neg Rhomberg  S: intact to  temp/LT/JPS throughout    Labs/Imaging:  MRI as above  July 2019: TChol 128, LDL 60, HDL 49, Tg 94; Cr 8.67; Plt 160  2018: BG normal  2017: TSH normal, HIV Ab neg    Assessment:  67 year old female w/ h/o HTN, OSA, HLD referred for chronic L CR and L PLIC lacunar infarcts.    Recommendations:  -Continue plavix  -Continue amlodipine/metoprolol; Outpatient BP monitoring; titrate antihypertensive tx for goal BP<130/80 long-term  -Continue crestor 40 qhs; target LDL<70  -CPAP for OSA; f/u sleep medicine  -Lifestyle modifications: Na-restricted mediterranean-type diet, physical activity  -physical therapy referral for gait instability  -RTC 6 months    Audie Box, MD

## 2018-01-12 ENCOUNTER — Ambulatory Visit (HOSPITAL_BASED_OUTPATIENT_CLINIC_OR_DEPARTMENT_OTHER): Payer: Medicare Other | Attending: Vascular Neurology | Admitting: Vascular Neurology

## 2018-01-12 ENCOUNTER — Encounter (HOSPITAL_BASED_OUTPATIENT_CLINIC_OR_DEPARTMENT_OTHER): Payer: Self-pay | Admitting: Vascular Neurology

## 2018-01-12 VITALS — BP 139/70 | HR 48 | Temp 97.5°F | Ht 63.0 in | Wt 176.3 lb

## 2018-01-12 DIAGNOSIS — I679 Cerebrovascular disease, unspecified: Secondary | ICD-10-CM | POA: Insufficient documentation

## 2018-01-12 DIAGNOSIS — R2681 Unsteadiness on feet: Secondary | ICD-10-CM | POA: Insufficient documentation

## 2018-01-12 DIAGNOSIS — Z6831 Body mass index (BMI) 31.0-31.9, adult: Secondary | ICD-10-CM

## 2018-01-12 DIAGNOSIS — I6381 Other cerebral infarction due to occlusion or stenosis of small artery: Secondary | ICD-10-CM | POA: Insufficient documentation

## 2018-01-12 NOTE — Patient Instructions (Signed)
-  Continue plavix  -Continue amlodipine/metoprolol; Outpatient BP monitoring; titrate antihypertensive tx for goal BP<130/80 long-term  -Continue crestor 40 qhs; target LDL<70  -CPAP for OSA; f/u sleep medicine  -Lifestyle modifications: Na-restricted mediterranean-type diet, physical activity  -physical therapy referral for gait instability  -RTC 6 months

## 2018-01-12 NOTE — Progress Notes (Signed)
I have not seen the patient but have read the note and agree with the assessment and plan

## 2018-01-19 ENCOUNTER — Other Ambulatory Visit (HOSPITAL_BASED_OUTPATIENT_CLINIC_OR_DEPARTMENT_OTHER): Payer: Self-pay | Admitting: Vascular Neurology

## 2018-01-21 ENCOUNTER — Encounter (HOSPITAL_BASED_OUTPATIENT_CLINIC_OR_DEPARTMENT_OTHER): Payer: Self-pay | Admitting: Vascular Neurology

## 2018-01-28 NOTE — Progress Notes (Signed)
[facility] PHYSICAL THERAPY INITIAL PLAN OF CARE          L & I Claim #: N/A    Certification From*: 01/29/18  Certification To*: 02/28/18  ___ Discontinue Therapy Services    Date of Symptom Onset*: 01/14/07  Start of Care Date*: 01/29/18  VISITS FROM SOC: 1  TOTAL [facility] UNITS TO DATE: 0    Referring Provider: Audie Boxizwan Kalani MD  Interpreter Status: Not Needed    Reason for Referral*: CVA  History of Present Illness: 67 year old female  with chronic ischemic strokes in 2008 and 2009 and gait instability and R leg paresis .       Pertinent Medical/Surgical History:   No past medical history on file.  Social History: Stacey [place] husband, Elijah Birkom. She volunteers at [employer] and she does it 1 day per week for 1.5 hours. She also enjoys reading, quilting and sewing.   Current / Past Rehabilitation: Earlier this year, pt saw Gerald StabsKate Scanlin at [facility], approximately 15-16 visits  Prior Level of Function  Prior Function Comments*: INdependent with all mobitity, denied any balance problems     Precautions:    Precautions: None                      Fall Screening:  Are you afraid of falling?: Yes  Have you fallen in the past year?: Yes  Issues with walking/balance/feeling unsteady: Yes    SUBJECTIVE:  Patients Statement: Stacey Schwartz reports leg pain only sometimes that is associated with cold weather. She has been diagnosed with fibromyalgia. Currently, Stacey Schwartz is doing some exercises in the morning from her previous PT and she also does high stepping and walking with a metronome. Stacey Schwartz also does balance exercise including tandem stance and grapevines. She also does target tapping. There is also exercise equipment available at her apartment including a NuStep and a treadmill. When the weather is nice, Stacey Schwartz is able to walk to [employer]. Latausha's exercise routine takes about 3 hours. Chevonne's husband notes that Stacey Schwartz often scuffs her toes when she's  walking outside. She reports unexpected balance challenges are consistently a problem and have caused falls.   Pain Score: 0 - No pain  Patient Stated Goals: Be able to walk some without the cane, improve speed and consistency of walking.     OBJECTIVE:  Vital signs: BP in sitting 139/74 heart rate 48    Range of Motion: Full ROM at B shoulders and elbows, L hip extension lacking 10 degrees, R hip extension to neutral    Posture: slight forward head posture    Strength:    L R   Shoulder flexion 5 5   Shoulder abduction 5 5   Elbow flexion 5 5   Elbow extension 5 5   Hip flexion 5 3+   Hip abduction 3+ 2+   Knee extension 4 5   Knee flexion 4 5   Ankle dorsiflexion 4 5   Ankle plantarflexion 5 (able to complete 5 full height heel raises) 2+ (unable to complete full height heel raise)     Sensation: Stacey OhmChris denies any sensory numbness or tingling anywhere    Coordination: finger-to-nose normal bilaterally    Tone: normal     Sitting Balance: No impairment noted    Standing Balance:     Functional Gait Assessment: 11/30 with SPC   Gait Level surface: 1 (11 seconds)   Change  in gait speed: 2   Gait with horizontal head turns: 1   Gait with vertical head turns: 1   Gait and pivot turn: 1   Step over obstacle: 0   Gait with narrow base of support: 0   Gait with eyes closed: 1 (23 seconds, rightward veer)   Ambulating backwards: 2   Steps: 2  FGA cutoff score of 22/30 is effective in classifying fall risk in older adults and predicting unexplained falls in community-dwelling older adults.      Vestibular:  Stacey Schwartz reports dizziness when it's hot, resolves with sitting and drinking cold water. She also reports vertigo with heights.    Bed Mobility: Independent    Transfers: Independent    Ambulation:    Distance: >100'   Level of Assist: mod I with SPC in right hand   Deviations: significant hip drop in right stance, poor foot clearance on right side, slow speed  Functional Gait Assessment: 11/30    Stairs: Up/down via  reciprocal pattern with B handrails mod I    Equipment:  SPC      INTERVENTION:    Evaluation Code 16109 x 60 minutes    Reviewed CORP Participation agreement with pt and explained policy for no-show, late cancellation, service animals, arrival to therapy under the influence of illicit drugs or alcohol, and terms for discontinuing therapy. Pt agreed and signed the form (see scanned materials).     Intervention 1- Therapeutic exercises x 15 minutes:   HEP introduced. Pt performed sit<>stand with green theraband tied round her knees and cues to maintain tension on the band in order to optimize glute med activation.  Single leg heel raise on right foot with left toes touching the ground to increase ankle PF strength.    Education:    Primary learner: patient and her husband, Tom  Preferred learning style:  demonstration, written and verbal  Barriers to learning: none   Cultural practices that influence care: none   Topic taught: Role of PT, plan of care, HEP introduction, CORP participation agreement   Response to education: pt and her husband verbalized understanding       ASSESSMENT: Pt presents with the following:  Comorbidities and Personal Factors affecting plan of care: fibromyalgia  Impairments: Impaired strength, impaired range of motion, gait impairments, impaired balance  Activity Limitations: fall risk, limited in household and community mobility, limited in ability to volunteer at [employer]  Participation Restrictions: limited in role as mother and wife  Clinical Presentation for Selection of Evaluation Code: Evolving  Clinical Decision Making Complexity:Moderate  Stacey Schwartz Stacey Schwartz) is a 67 year old woman who sustained 2 strokes in 2008 and 2009. She presents with the above impairments and her FGA score of 11/30 indicates balance impairments that put her at an increased fall risk. Stacey Schwartz holds her [facility] in her right hand despite that being her hemiparetic side as she notes that it is just too  difficult for her to hold the cane in her left hand due to being so right-hand dominant. This contributes to her significant Trendellenberg pattern in right stance. Additionally, Stacey Schwartz exhibits significant gait impairments which put her at an increased risk of falling, especially in the community. Stacey Schwartz will benefit from outpatient PT to address these impairments and activity limitations to improve her balance, strength and flexibility. She benefits from excellent motivation and great support from her husband, Elijah Birk.     Fall risk based on assessment: moderate     Rehabilitation potential  is: Good    Potential Barriers to achieve rehab goals: Chronicity since stroke    Care Connections Functional Index Score: Outcome Score: 41/50      GOALS:    Discharge Goals:  1. Pt will consistently perform a comprehensive HEP for strength, balance and ROM  2. Pt will improve FGA score to >17/30 to indicate improved balance and decreased fall risk  3. Pt will improve right ankle PF strength to >3/5 to achieve improved efficiency and speed of gait        Current Level of Function Monthly Goals     01/28/2018 To be reviewed by:  02/28/2018   Pt with strength and ROM impairments   Pt will perform HEP for strength and ROM   Pt scored 11/30 on the FGA Pt will improve FGA score to >13/30 to indicate improved balance and decreased fall risk   Pt with slow gait pattern Therapist will assess and               The above goals and plan of care have been discussed and agreed upon by the patient and her husband, Elijah Birk.    PLAN:   Patient will be seen for 1x/ every other week for approximately 6 total visits.      Planned interventions: Therapeutic Activities, Therapeutic Exercise, Neuromuscular Re-education, Gait Training    Plan for next visit:  Hip flexor stretching, gait re-ed, core strengthening    Anticipated discharge date: December 2019    Donnamarie Rossetti PT DPT NCS  Physical Therapist  Comprehensive Outpatient Rehabilitation  Program ([facility])  [facility]

## 2018-01-29 ENCOUNTER — Ambulatory Visit (HOSPITAL_BASED_OUTPATIENT_CLINIC_OR_DEPARTMENT_OTHER): Payer: Medicare Other | Attending: Vascular Neurology | Admitting: Rehabilitative and Restorative Service Providers"

## 2018-01-29 DIAGNOSIS — R2681 Unsteadiness on feet: Secondary | ICD-10-CM | POA: Insufficient documentation

## 2018-01-29 DIAGNOSIS — I6381 Other cerebral infarction due to occlusion or stenosis of small artery: Secondary | ICD-10-CM

## 2018-01-29 DIAGNOSIS — Z7409 Other reduced mobility: Secondary | ICD-10-CM | POA: Insufficient documentation

## 2018-02-02 ENCOUNTER — Encounter (HOSPITAL_BASED_OUTPATIENT_CLINIC_OR_DEPARTMENT_OTHER): Payer: Self-pay | Admitting: Rehabilitative and Restorative Service Providers"

## 2018-02-08 NOTE — Progress Notes (Signed)
[facility] PHYSICAL THERAPY TREATMENT NOTE  L&I Claim Number:  N/A    Certification From*: 01/29/18  Certification To*: 02/28/18    Visits from [facility]: 2  Referring Provider: Audie Box MD    Interpreter Status: Not Needed    SUBJECTIVE: Stacey Schwartz notes that she was able to do her exercises but she was sore afterwards. She is wearing leather shoes at home and feels that the extra weight is helping to improve her strength.     OBJECTIVE/INTERVENTIONS:  assessed with SPC: 18.7 seconds    Intervention 1- Therapeutic exercises x 25 Minutes:   Hip flexor stretch introduced in [place] test position with pt able to return demonstrate for 60 seconds.  Pt transferred from standing to the floor using mat table for support (simulating her bed). In quadruped, pt performed single hip extension with manual facilitation initially for stability when in weight bearing on the right side. Decreased LLE movement noted compared to RLE but pt with improved hip stability with this modification.  Standing with chair on her left and SPC in right hand, pt performed small march with manual facilitation to prevent hip drop in stance phase on the right. Mirror used for Financial trader.  Handout provided with these 3 exercises and Stacey Schwartz demonstrated teach-back of all.    Intervention 2- Gait re-ed x 20 Minutes:   assessed, pt ambulated 0.53 m/s.  Gait re-ed using mirror with visual feedback with therapist allowing pt to lightly hold her hand with pt's left hand to decrease right hip drop in stance phase. Pt noted good activation of right glute med and reported fatigue with this.  Advised pt and her husband to walk with pt lightly holding onto her husband with her left hand in order to off-load the right hip in stance phase to decrease hip drop. Both verbalized understanding.    HEP:  Sit<>stand with green theraband around knees  Standing single leg heel raise on right foot with toes touching down for stability  Thomas test hip flexor  stretch 3 x 60 seconds  Quadruped hip extension  Slow march with BUE support laterally for hip abduction in stance phase    Education:    Primary learner: patient and her husband, Tom  Preferred learning style:  demonstration, written and verbal  Barriers to learning: none   Cultural practices that influence care: none   Topic taught: HEP progression, gait re-ed   Response to education: Stacey Schwartz and Tom verbalized understanding      ASSESSMENT/PATIENT RESPONSE TO THERAPY:  Stacey Schwartz has had great compliance with her previously assigned home exercises and is reporting improved activation of her muscles. She continues to display a hip drop in stance phase on the right side but this was improved by allowing her to lightly hold onto something with her left hand. Although Stacey Schwartz has stated that she is unable to coordinate a cane in her left hand due to her right hand dominance, she did well with a light handheld assist on her left hand. Plan to continue to progress her gait mechanics and proximal strength.    PLAN:   Continue intervention per Plan of Care.     Plan for next visit: Assess 6 minute walk test, re-assess FGA, hip strengthening exercises in stance and quadruped     Donnamarie Rossetti PT DPT NCS  Physical Therapist  Comprehensive Outpatient Rehabilitation Program ([facility])  [facility]

## 2018-02-09 ENCOUNTER — Ambulatory Visit (HOSPITAL_BASED_OUTPATIENT_CLINIC_OR_DEPARTMENT_OTHER): Payer: Medicare Other | Attending: Vascular Neurology | Admitting: Rehabilitative and Restorative Service Providers"

## 2018-02-09 DIAGNOSIS — Z7409 Other reduced mobility: Secondary | ICD-10-CM | POA: Insufficient documentation

## 2018-02-09 DIAGNOSIS — I6381 Other cerebral infarction due to occlusion or stenosis of small artery: Secondary | ICD-10-CM | POA: Insufficient documentation

## 2018-02-26 NOTE — Progress Notes (Signed)
[facility] PHYSICAL THERAPY PLAN OF CARE REVIEW      L & I Claim #: N/A    Certification From*: 73/22/02  Certification To*: 54/27/06    Date of Symptom Onset*: 01/14/07  Start of Care Date*: 01/29/18  VISITS FROM SOC: 3    Referring Provider: Phebe Colla MD  Interpreter Status: Not Needed    Precautions:    Precautions: None                      SUBJECTIVE:  Stacey Schwartz notes that the hip flexor stretch on the bed was difficult because her bed is too soft. Her other exercises are going well but she wasn't able to do her typical morning routine today because her husband, Stacey Schwartz, had an early doctor's appointment.   Pain Score: 2(mild neck pain)    OBJECTIVE:  Functional Gait Assessment: 15/30 with SPC   Gait Level surface: 1 (10.7 seconds)   Change in gait speed: 2   Gait with horizontal head turns: 2   Gait with vertical head turns: 2   Gait and pivot turn: 2   Step over obstacle: 1   Gait with narrow base of support: 0   Gait with eyes closed: 1 (20.5 seconds)   Ambulating backwards: 2   Steps: 2  FGA cutoff score of 22/30 is effective in classifying fall risk in older adults and predicting unexplained falls in community-dwelling older adults.    INTERVENTION:    Intervention 1- Neuromuscular re-ed x 25 Minutes:   FGA re-assessed, see above.  Balance HEP progressed to include tandem stance with limited UE support and 30 second hold. Also introduced tandem gait with one hand trailing along wall and the other holding SPC. Handout provided and pt demonstrated teach-back.    Intervention 2- Therapeutic exercises  x 20 Minutes:  Hip flexor stretching performed in standing with pt first demonstrating posterior and anterior pelvic tilt with feet even. Next, took small step forwards and performed posterior pelvic tilt. Pt reported feeling stretch along the front of her hip.  Standing with chair to pt's left and SPC in her right hand, pt performed right leg single leg hip drop and then return to neutral pelvic position. She  required manual cues initially for this and then improved technique with repetition. Pt able to perform 5 repetitions before she reported fatigue.    HEP:  Sit<>stand with green theraband around knees  Standing single leg heel raise on right foot with toes touching down for stability  Standing hip flexor stretch with posterior pelvic tilt and 60 second hold  Quadruped hip extension  Slow march with BUE support laterally for hip abduction in stance phase  Tandem stance 30 seconds with limited UE support  Standing hip drop and then return to neutral    Education:    Primary learner: patient and her husband, Stacey Schwartz  Preferred learning style:  demonstration, written and verbal  Barriers to learning: none   Cultural practices that influence care: none   Topic taught: HEP progression, gait re-ed, hip flexor stretching   Response to education: pt demonstrated teach-back and verbalized understanding        ASSESSMENT: Pt presents with the following:  Pt presents with the following:  Impairments: Impaired strength, impaired range of motion, gait impairments, impaired balance  Activity Limitations: fall risk, limited in household and community mobility, limited in ability to volunteer at [employer]  Participation Restrictions: limited in role as mother and wife  [name] Stacey Schwartz) is a 67 year old woman who sustained 2 strokes in 2008 and 2009. She presents with the above impairments. On initial evaluation, Stacey Schwartz scored 11/30 on the Functional Gait Assessment and today she scored 15/30. This indicates balance impairments that put her at an increased fall risk but also that her balance has improved. Stacey Schwartz holds her [facility] in her right hand despite that being her hemiparetic side as she notes that it is just too difficult for her to hold the cane in her left hand due to being so right-hand dominant. This contributes to her significant Trendellenberg pattern in right stance. Additionally, Stacey Schwartz exhibits significant gait  impairments which put her at an increased risk of falling, especially in the community. Stacey Schwartz will benefit from outpatient PT to address these impairments and activity limitations to improve her balance, strength and flexibility. She benefits from excellent motivation and great support from her husband, Stacey Schwartz.    Fall risk based on assessment: moderate     Rehabilitation potential is: Good    Potential Barriers to achieve rehab goals: chronicity since initial injury     Care Connections Functional Index Score: Outcome Score: 41    GOALS:    Discharge Goals:  1. Pt will consistently perform a comprehensive HEP for strength, balance and ROM  2. Pt will improve FGA score to >17/30 to indicate improved balance and decreased fall risk  3. Pt will improve right ankle PF strength to >3/5 to achieve improved efficiency and speed of gait      Previous Monthly Goals Progress Toward Monthly Goals/  Current Level of Function Monthly Goals      02/26/2018 To be reviewed by:  04/02/2018   Pt will perform HEP for strength and ROM Met   Pt will perform advanced HEP for strength, ROM and balance   Pt will improve FGA score to >13/30 to indicate improved balance and decreased fall risk Met, pt scored 15/30   Pt will improve FGA score to >16/30 to indicate improved balance and decreased fall risk   Therapist will assess 10MWT and 6MWT Not met due to time constraints   Therapist will assess 10MWT and 6 minute walk test                   The above goals and plan of care have been discussed and agreed upon by the patient and her husband, Stacey Schwartz.    PLAN:   Patient will be seen for 1x/ every other week for approximately 2 additional visits      Planned interventions: Therapeutic Activities, Therapeutic Exercise, Neuromuscular Re-education, Gait Training    Plan for next visit:  Assess 6 minute walk test, assess 10MWT, progress weight bearing hip abduction strength with theraband    Anticipated discharge date: November 2019    Ninetta Lights PT  DPT NCS  Physical Therapist  Comprehensive Outpatient Rehabilitation Program ([place])  [facility]

## 2018-03-02 ENCOUNTER — Ambulatory Visit (HOSPITAL_BASED_OUTPATIENT_CLINIC_OR_DEPARTMENT_OTHER): Payer: Medicare Other | Admitting: Rehabilitative and Restorative Service Providers"

## 2018-03-02 DIAGNOSIS — Z7409 Other reduced mobility: Secondary | ICD-10-CM

## 2018-03-02 DIAGNOSIS — I6381 Other cerebral infarction due to occlusion or stenosis of small artery: Secondary | ICD-10-CM

## 2018-03-15 NOTE — Progress Notes (Addendum)
[facility] PHYSICAL THERAPY DISCHARGE NOTE  L&I Claim Number:  N/A    Certification From*: 27/06/23  Certification To*: 76/28/31    Visits from [facility]: 4  Referring Provider: Phebe Colla MD    Interpreter Status: Not Needed    SUBJECTIVE: Stacey Schwartz reports that the standing hip flexor stretch caused some pain with the prolonged positioning.     OBJECTIVE/INTERVENTIONS:  6 minute walk test assessed: 169mwith SPC    Intervention 1- Therapeutic activities x 20 Minutes:   Prior to 6MWT BP 139/76 HR 53. Pt had been talking so advised to sit without talking for 3 minutes and then re-assessed with BP 132/75 heart rate 51. 6MWT assessed, see above. Afterwards, BP 142/79 HR 55. Pt noted to have very short right LE stance time.    Normative Data for 6 Minute Walk Test for   Community-dwelling Elderly:   (Dorene Grebeet al, 2002; n = 96; community dwelling elderly people with independent function who were nonsmokers with no history of dizziness; > 68yo and did not use assistive devices, Community-dwelling Elderly)   Mean Distance in [place] Age & Gender    Age  Female  Female    640-69yrs  548m  51m    756-79yrs  574m  481m    843-89yrs  417 m  335m      Recommended that pt perform gait re-ed while listening to music to improve symmetry of stance time and allow for improved ability to vary walking speed.    Intervention 2- Therapeutic exercises x 30 Minutes:   Reviewed standing hip flexor stretch. Pt noted to be standing in semi-squat position with poor hip extension noted. Cued her to straighten back leg and shift weight forwards and she reported feeling improved hip flexor stretch. Also able to maintain for 60 seconds without discomfort.  To improve comfort level with RLE weight bearing, pt performed toe taps to 1st step of stairs with light UE support. This given as HEP on hand-out.    HEP:  Sit<>stand with green theraband around knees  Standing single leg heel raise on right foot with toes touching down for  stability  Standing hip flexor stretch with posterior pelvic tilt and 60 second hold  Quadruped hip extension  Slow march with BUE support laterally for hip abduction in stance phase  Tandem stance 30 seconds with limited UE support  Standing hip drop and then return to neutral  Left foot taps to step with light UE support    Education:    Primary learner: patient and her husband, Stacey  Preferred learning style:  demonstration, written and verbal  Barriers to learning: none   Cultural practices that influence care: none   Topic taught: Gait re-ed, HEP progression, discharge instructions   Response to education: CGerald Stabsand TGershon Musselverbalized understanding      ASSESSMENT:   Pt presents with the following:  Impairments: Impaired strength, impaired range of motion, gait impairments, impaired balance  Activity Limitations:fall risk, limited in household and community mobility, limited in ability to volunteer at [employer] Participation Restrictions:limited in role as mother and wife    Stacey Schwartz(Stacey Schwartz is a 67 year old woman who sustained 2 strokes in 2008 and 2009. She presents with the above impairments. On initial evaluation, CGerald Stabsscored 11/30 on the Functional Gait Assessment and on 10/22 she scored 15/30. This indicates balance impairments that put her at an increased fall risk  but also that her balance has improved.Stacey Schwartz holds her [facility] in her right hand despite that being her hemiparetic side as she notes that it is just too difficult for her to hold the cane in her left hand due to being so right-hand dominant. This contributes to her significant Trendellenberg pattern in right stance.Additionally, Stacey Schwartz exhibits significant gait impairments which put her at an increased risk of falling, especially in the community. Her 6 minute walk test distance of 68 mindicates impaired activity tolerance and is far below her age-predicted normal of 6852mChGerald Stabsow has a comprehensive HEP and is appropriate for  [facility]. She has expressed that she would like to return in the spring when this therapist has returned from her maternity leave so I recommended that she seek a new PT referral at that time. Given that her strokes were >10 years ago, I think that resuming therapy on an every-other-week basis would be advisable.    Care Connections Functional Index Score: 38/50    GOALS:    Discharge Goals:  1. Pt will consistently perform a comprehensive HEP for strength, balance and ROM - met  2.Pt will improve FGA score to >17/30 to indicate improved balance and decreased fall risk - not met  3.Pt will improve right ankle PF strength to >3/5 to achieve improved efficiency and speed of gait - not assessed today    DISCHARGE PLAN:   [facility]    The patient is in agreement with this plan.    Patient will continue to perform daily written home exercise program.    AnNinetta LightsT DPT NCS  Physical Therapist  [facility] ([facility]

## 2018-03-16 ENCOUNTER — Ambulatory Visit (HOSPITAL_BASED_OUTPATIENT_CLINIC_OR_DEPARTMENT_OTHER): Payer: Medicare Other | Attending: Vascular Neurology | Admitting: Rehabilitative and Restorative Service Providers"

## 2018-03-16 DIAGNOSIS — Z7409 Other reduced mobility: Secondary | ICD-10-CM | POA: Insufficient documentation

## 2018-03-16 DIAGNOSIS — I6381 Other cerebral infarction due to occlusion or stenosis of small artery: Secondary | ICD-10-CM | POA: Insufficient documentation

## 2018-05-26 ENCOUNTER — Inpatient Hospital Stay: Payer: Self-pay

## 2018-07-08 ENCOUNTER — Telehealth (HOSPITAL_BASED_OUTPATIENT_CLINIC_OR_DEPARTMENT_OTHER): Payer: Self-pay | Admitting: Vascular Neurology

## 2018-07-08 NOTE — Telephone Encounter (Signed)
RETURN CALL: Voicemail - Detailed Message      SUBJECT:  Appointment Request     REASON FOR VISIT: 6 follow up  PREFERRED DATE/TIME: End of March or beginning April,  Early Afternoon  ADDITIONAL INFORMATION: none

## 2018-07-17 IMAGING — CT CT ABD-PELV W/ CM
2 of 5 series · 16 of 46 positions shown, 18 images · IV contrast (iopamidol)
Comparison: None.

CLINICAL DATA: 65-year-old female with acute right lower abdominal
and pelvic pain today.

EXAM:
CT ABDOMEN AND PELVIS WITH CONTRAST
TECHNIQUE: Multidetector CT imaging of the abdomen and pelvis was performed
using the standard protocol following bolus administration of
intravenous contrast.
CONTRAST:  100mL NIY2BD-CNN IOPAMIDOL (NIY2BD-CNN) INJECTION 61%

[Series 2: axial st · axial · 0.79mm/px · z∈[-888,-458]mm · 13 of 96 slices shown, 15 images]
[im 5/96  soft-tissue]
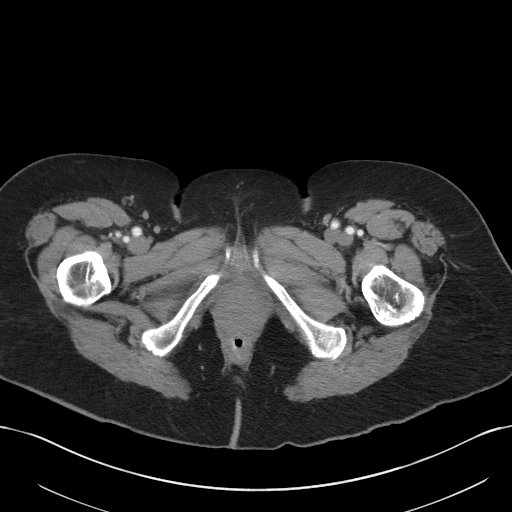
[im 5/96  bone]
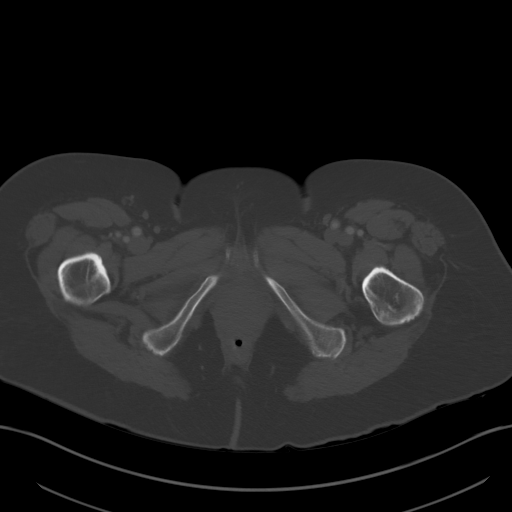
[im 15/96  soft-tissue]
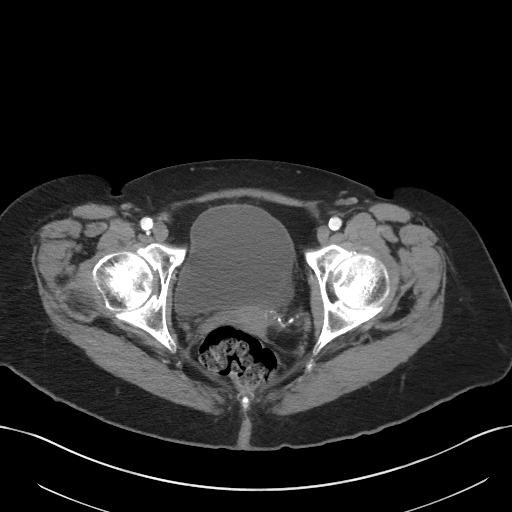
[im 20/96  soft-tissue]
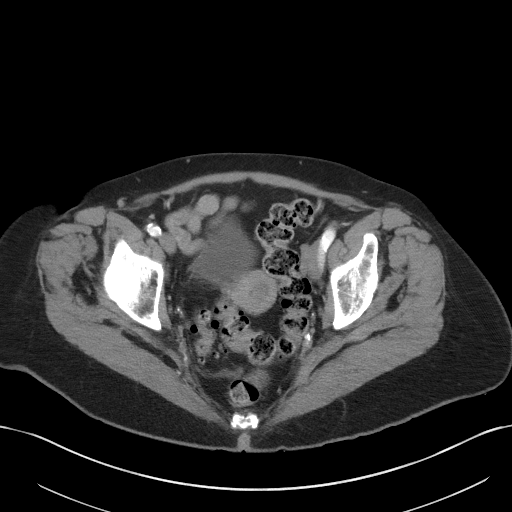
[im 29/96  soft-tissue]
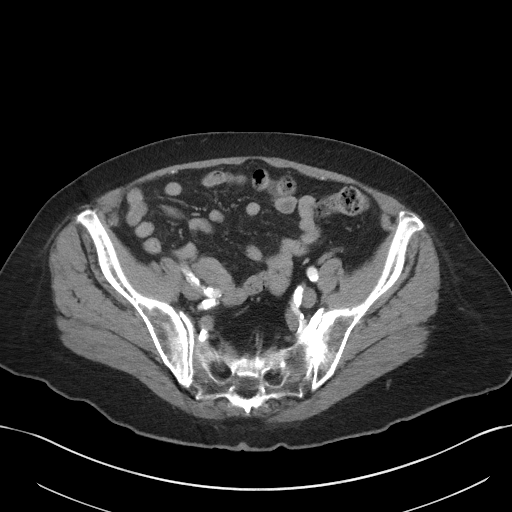
[im 34/96  soft-tissue]
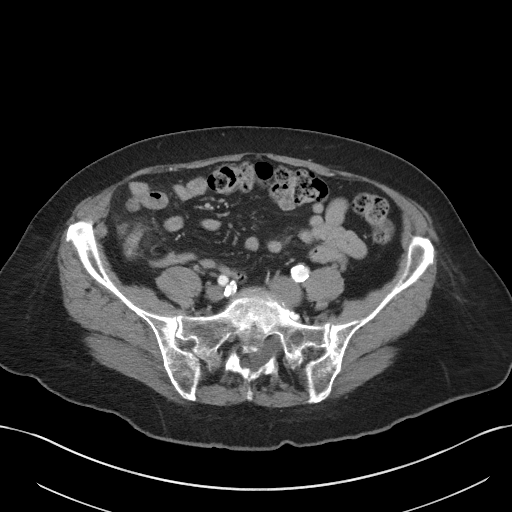
[im 43/96  soft-tissue]
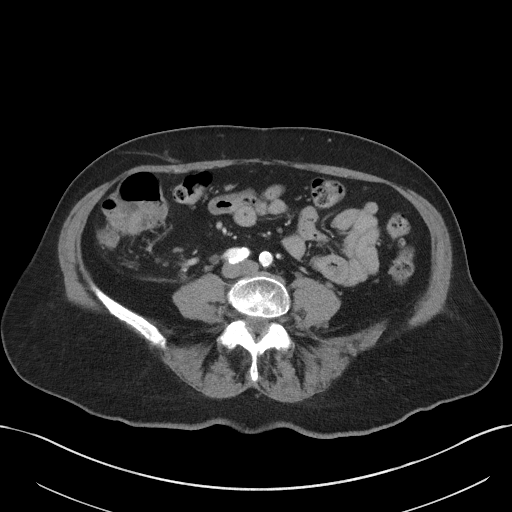
[im 48/96  soft-tissue]
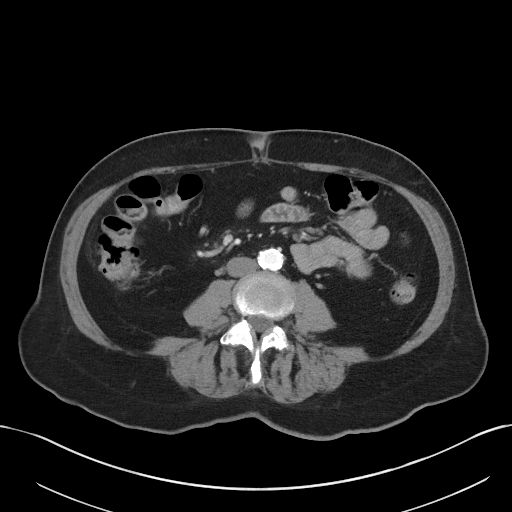
[im 53/96  soft-tissue]
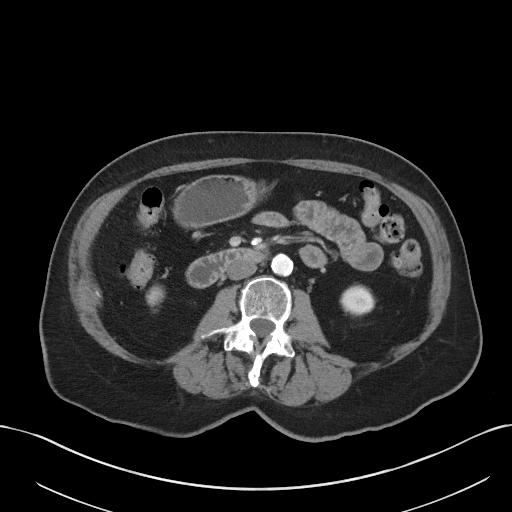
[im 62/96  soft-tissue]
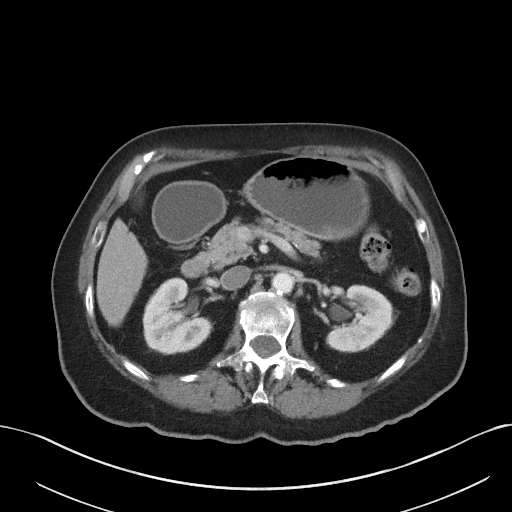
[im 62/96  bone]
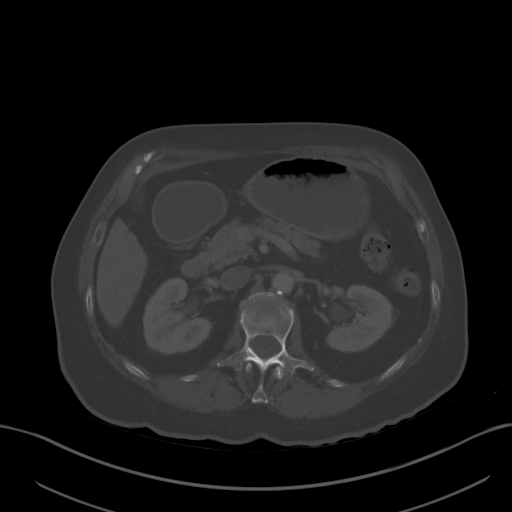
[im 67/96  soft-tissue]
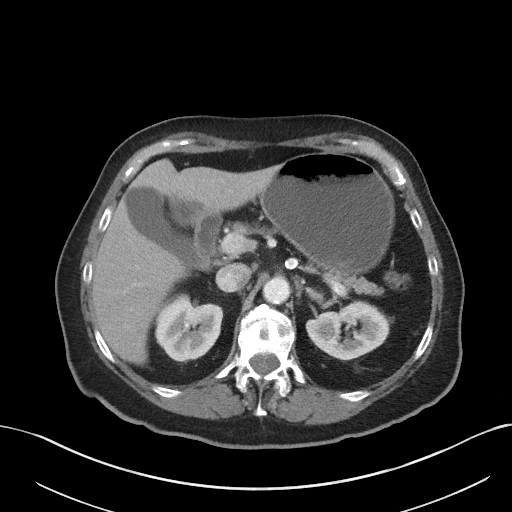
[im 77/96  soft-tissue]
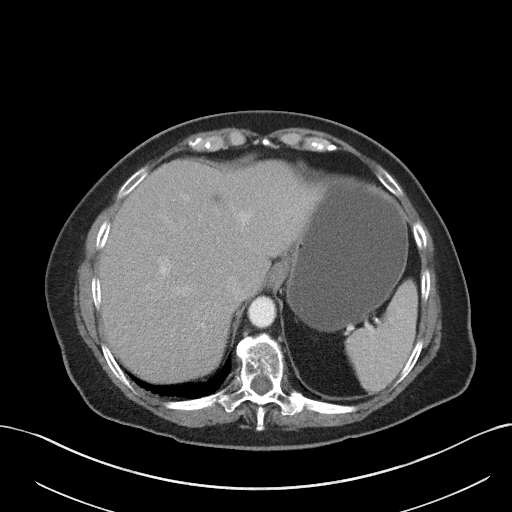
[im 81/96  soft-tissue]
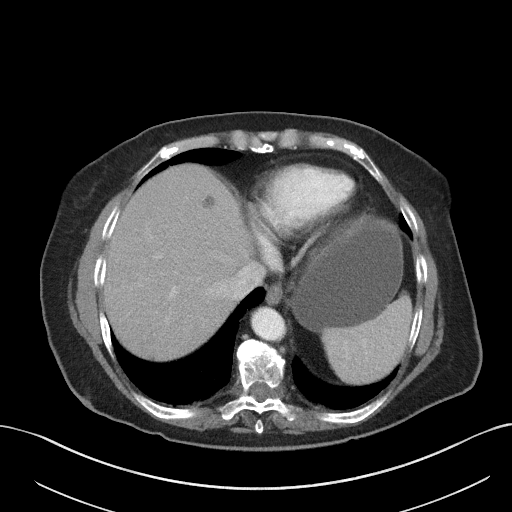
[im 91/96  soft-tissue]
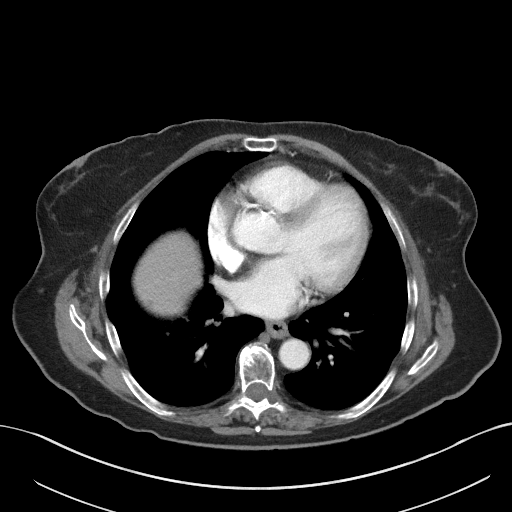

[Series 5: coronal st · coronal · 0.68mm/px · 3 of 85 slices shown]
[im 29/85  soft-tissue]
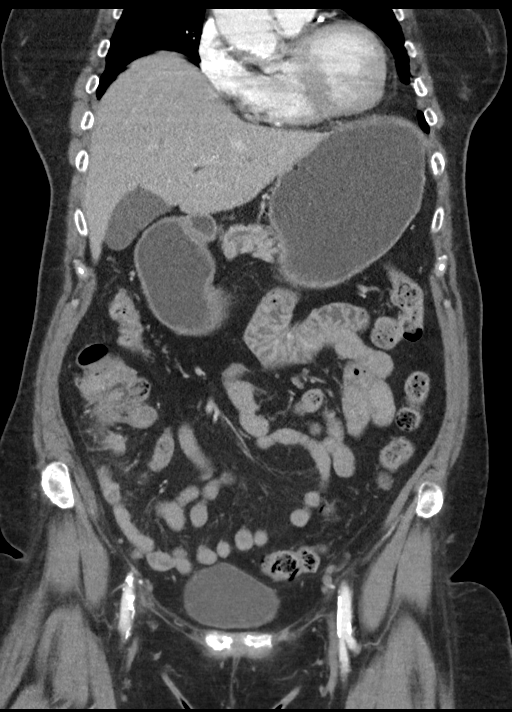
[im 38/85  soft-tissue]
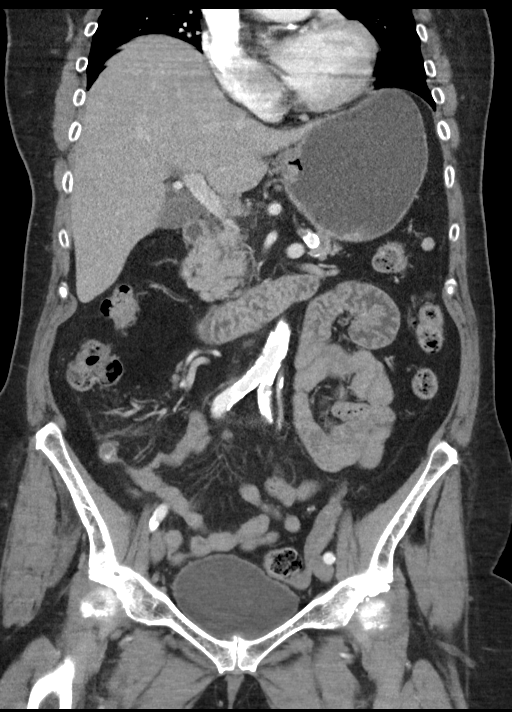
[im 47/85  soft-tissue]
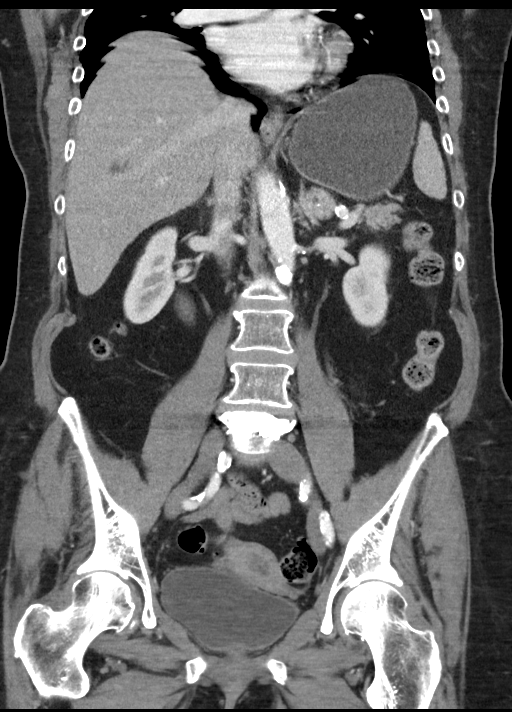

[16 of 46 positions shown; findings below may reference images not displayed]

FINDINGS: Lower chest:  Cardiomegaly and coronary artery calcifications noted.

Hepatobiliary: The liver and gallbladder are unremarkable except for
multiple hepatic cysts. There is no evidence of biliary dilatation.

Pancreas: Unremarkable

Spleen: Unremarkable

Adrenals/Urinary Tract: Mild bilateral renal cortical thinning and
small bilateral renal cysts are noted. A 2.2 x 2.7 cm left adrenal
mass has a relative washout of 40%, consistent with an adenoma. The
right adrenal gland and bladder are unremarkable.

Stomach/Bowel: An enlarged appendix with adjacent inflammation is
compatible with appendicitis. No free fluid, abscess or bowel
obstruction.

Vascular/Lymphatic: Abdominal aortic atherosclerotic calcifications
noted without aneurysm. No enlarged lymph nodes identified.

Reproductive: Unremarkable

Other: No free fluid or pneumoperitoneum.

Musculoskeletal: Compression fractures and vertebral augmentation
changes at T12 and L5 noted. No acute abnormalities identified.
IMPRESSION: Appendicitis without complicating features.

Abdominal aortic atherosclerosis.

Cardiomegaly and coronary artery disease.

## 2018-08-31 ENCOUNTER — Encounter (HOSPITAL_BASED_OUTPATIENT_CLINIC_OR_DEPARTMENT_OTHER): Payer: Self-pay | Admitting: Vascular Neurology

## 2018-09-02 ENCOUNTER — Encounter (HOSPITAL_BASED_OUTPATIENT_CLINIC_OR_DEPARTMENT_OTHER): Payer: Medicare Other | Admitting: Vascular Neurology

## 2018-09-02 ENCOUNTER — Ambulatory Visit (HOSPITAL_BASED_OUTPATIENT_CLINIC_OR_DEPARTMENT_OTHER): Payer: Medicare Other | Attending: Vascular Neurology | Admitting: Vascular Neurology

## 2018-09-02 DIAGNOSIS — I6381 Other cerebral infarction due to occlusion or stenosis of small artery: Secondary | ICD-10-CM | POA: Insufficient documentation

## 2018-09-02 DIAGNOSIS — R2681 Unsteadiness on feet: Secondary | ICD-10-CM | POA: Insufficient documentation

## 2018-09-02 NOTE — Progress Notes (Signed)
Distant Site Telemedicine Encounter    I conducted this encounter from [facility] via secure, live, face-to-face video conference with the patient. Wynona CanesChristine was located at home with  Her husband.  Prior to the interview, the risks and benefits of telemedicine were discussed with the patient and verbal consent was obtained.        HPI:  Kenard GowerChristine Ann Tino is a 68 year old female with HTN, OSA, HLD referred for chronic lacunar infarcts (L CR 2008, L PLIC 2009) who was scheduled for 6 month followup today.    She had a fall in December 2019 with compression fracture and needed L1 kyphoplasty. She still has back and side pain treated with methocarbamol and PT. She fell in February 2020 - felt like she twisted a muscle on her right side in the AM and had been struggling with her CPAP gear, and fell down. She had a bruise on her head but did not need to go to the hospital. She got a shot in her hip which eased her pain a bit. She had an apopintment with her surgeon afterwards and got a repeat Xray which was unremarkable.    Still using CPAP which is going well. BP is well controlled, 100s-120s systolic, checks 3x/week. She is still working on rehabilitation from the February fall, and she has home exercises. [place] [address] [place]    Medication Compliance: yes  Symptom Recurrence: no  Cryptogenic Stroke:  no    mRS = 2  0 - No symptoms at all  1 - No significant disability despite symptoms; able to carry out all usual duties and activities    2 - Slight disability; unable to carry out all previous activities, but able to look after own affairs without assistance     3 - Moderate disability; requiring some help, but able to walk without assistance     4 - Moderately severe disability; unable to walk without assistance and unable to attend to own bodily needs without assistance     5 - Severe disability; bedridden, incontinent and requiring constant nursing care and attention    6  - Dead    Past Medical History  Patient Active Problem List    Diagnosis Date Noted   . Impaired functional mobility, balance, gait, and endurance [Z74.09] 01/29/2018   . Gait instability [R26.81] 01/12/2018   . Multiple lacunar infarcts [I63.81] 01/12/2018   . 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 AMLODIPINE BESYLATE OR Take by mouth.     . Cholecalciferol 1000 UNIT/10ML Oral Liquid Take by mouth.     . clopidogrel 75 MG Oral Tablet Take 75 mg by mouth.     . Cyanocobalamin (VITAMIN B12) 1000 MCG Oral Tab CR Take by mouth.     . denosumab 60 MG/ML Subcutaneous Solution Prefilled Syringe Inject 60 mg under the skin.     . Metoprolol-hydroCHLOROthiazide (METOPROLOL-HCTZ ER OR) Take by mouth.     . Omega-3 Fatty Acids (FISH OIL) 1000 MG Oral Capsule Take 1,000 mg by mouth.     . raNITIdine HCl (ZANTAC OR)      . Rosuvastatin Calcium (CRESTOR OR) Take by mouth.         Allergies:  Review of patient's allergies indicates:  Allergies  Allergen Reactions   . Penicillins        Review of Systems:   A complete ROS was negative aside from what's mentioned in the HPI.    EXAM  Gen: Patient seated comfortably in chair, talkative, pleasant, in NAD.    MS: awake, alert, oriented to person, place, time and situation, speech fluent with no dysarthria, no paraphasic errors. Comprehension intact. Attention intact.    CN: EOMI w/ no nystagmus, no diplopia, facial motor strength is full, tongue midline.    Motor: No pronator drift, no orbiting. Antigravity or greater in all 4 extremities.    Coordination: FNF intact with no dysmetria.    Movement: no abnormal movements noted.    Gait/stance: Posture is normal. Gait is steady and slightly wide based. Hesitant, walks with cane.      ASSESSMENT/RECOMMENDATIONS:  1. Multiple lacunar infarcts      Jenalynn Iler is a 68 year old female with HTN, OSA, HLD originally referred for chronic lacunar infarcts (L CR 2008, L PLIC 2009). On exam, she is doing well and has no new stroke-like exam findings or symptoms. No new imaging needed at this time.    Her vascular risk factors include HTN, HLD, OSA, and are generally well treated. I agree with her current medical therapy.    We discussed secondary stroke prevention and modifiable risk factors. Goal blood pressure should be <130/80. Adequate glucose control to achieve a goal A1C value of less than 6.5%. We discussed the importance of not smoking and limiting alcohol consumption. She is on statin therapy for secondary stroke prevention. I encouraged healthy, well-balanced diet and at least 150 minutes of moderate physical activity a week.     I would like to see the patient again in 6 months. Thank you for allowing me to participate in the care of this very pleasant patient.     Patient seen and discussed with attending of record Dr. Lars Masson.    Sherry Ruffing MD  Vascular Neurology Fellow

## 2018-09-06 NOTE — Progress Notes (Addendum)
I saw and evaluated this patient with Dr. Loel Dubonnet. I agree with the history, examination, assessment and plan outlined in the clinical note.    We conducted this encounter from [facility] via secure, live, face-to-face video conference with the patient. Stacey Schwartz was located at home and accompanied by her husband. This visit occurred during the Coronavirus (COVID-19) Public Health Emergency. Prior to the interview, the risks and benefits of telemedicine were discussed with the patient and verbal consent was obtained.    68 year old female w/ h/o HTN, OSA, HLD referred for chronic L CR and L PLIC lacunar infarcts.  -continue plavix 75 daily  -continue amlodipine and metoprolol/HCTZ; continue outpatient BP monitoring; titrate antihypertensive tx for goal BP<130/80  -continue high-intensity statin therapy w/ crestor 40 qhs; target LDL<70  -CPAP for OSA; f/u sleep medicine  -Lifestyle modifications: Na-restricted mediterranean-type diet, physical activity  -physical therapy referral for gait instability  -RTC for in-person visit in 6 months

## 2018-09-08 ENCOUNTER — Encounter (HOSPITAL_BASED_OUTPATIENT_CLINIC_OR_DEPARTMENT_OTHER): Payer: Self-pay | Admitting: Vascular Neurology

## 2018-10-15 ENCOUNTER — Telehealth (HOSPITAL_BASED_OUTPATIENT_CLINIC_OR_DEPARTMENT_OTHER): Payer: Self-pay | Admitting: Vascular Neurology

## 2018-10-15 NOTE — Telephone Encounter (Signed)
Per Dr. Lars Masson, pt. Needs a 6 month f.u appt.   Made appt. And LVM regarding date and time.

## 2019-03-10 ENCOUNTER — Encounter (HOSPITAL_BASED_OUTPATIENT_CLINIC_OR_DEPARTMENT_OTHER): Payer: Self-pay | Admitting: Vascular Neurology

## 2019-03-10 ENCOUNTER — Ambulatory Visit (HOSPITAL_BASED_OUTPATIENT_CLINIC_OR_DEPARTMENT_OTHER): Payer: Medicare Other | Attending: Vascular Neurology | Admitting: Vascular Neurology

## 2019-03-10 VITALS — BP 154/77 | HR 52 | Temp 97.2°F | Resp 16 | Ht 63.0 in | Wt 160.0 lb

## 2019-03-10 DIAGNOSIS — Z8673 Personal history of transient ischemic attack (TIA), and cerebral infarction without residual deficits: Secondary | ICD-10-CM | POA: Insufficient documentation

## 2019-03-10 DIAGNOSIS — I679 Cerebrovascular disease, unspecified: Secondary | ICD-10-CM | POA: Insufficient documentation

## 2019-03-10 DIAGNOSIS — R2681 Unsteadiness on feet: Secondary | ICD-10-CM | POA: Insufficient documentation

## 2019-03-10 NOTE — Progress Notes (Signed)
Vascular Neurology Follow-Up    68 y/o F w/ HTN, OSA, HLDwith chronicL CR and L PLIClacunar infarcts, cSVD (w/ prominent BG dilated PVS), RLE paresis, gait instability.  Since LOV, she denies new focal neurological sx. She underwent back surgery in Jan 2020 and has recovered well. 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 She uses a Youth worker for ambulation and notes most difficulty when she is sleep deprived. She has consulted with a physical therapist and conducts home strength training and flexibility exercises, but feels like her recovery has plateaued. She is interested in consulting with rehab medicine for gait/balance.  Outpatient BP monitoring demonstrates readings of <130/80. The patient endorses medication and CPAP compliance and denies medication adverse effects.    Exam:  Blood Pressure (Abnormal) 154/77    Pulse 52    Temperature 97.2 F (36.2 C) (Temporal)    Respiration 16    Height 5\' 3"  (1.6 m)    Weight 160 lb (72.6 kg)    Oxygen Saturation 97%    Body Mass Index 28.34 kg/m     Gen: NAD  Neck: supple  Psych: affect appropriate  Neuro Examination: alert, attentive, language fluent, follows all commands, recent and remote memory preserved, no aphasia/neglect, no dysarthria; VFFTC, gaze conjugate, EOM full without nystagmus, face symmetric, trapezius/SCM full b/l; normal tone and muscle bulk, no drift, strength full throughout except RLE HF/KF/ADF 4, no tremor or abnormal movements evident; DTRs not assessed; FTN intact b/l w/o evident dysmetria, FFM / hand taps symm w/o dysrhythmia, foot taps slower on R; Sl wide stance, hemiparetic gait w/ slow cadence and 3-step turns, neg Rhomberg; SILT throughout    Current Outpatient Medications:     AMLODIPINE BESYLATE OR, Take 5 mg by mouth daily. , Disp: , Rfl:     Cholecalciferol 1000 UNIT/10ML Oral Liquid, Take by mouth., Disp: , Rfl:     clopidogrel 75 MG Oral Tablet, Take 75 mg by mouth daily. , Disp: ,  Rfl:     Cyanocobalamin (VITAMIN B12) 1000 MCG Oral Tab CR, Take by mouth., Disp: , Rfl:     denosumab 60 MG/ML Subcutaneous Solution Prefilled Syringe, Inject 60 mg under the skin., Disp: , Rfl:     methocarbamol 500 MG tablet, Take 500-1,000 mg by mouth 3 times a day as needed., Disp: , Rfl:     metoprolol tartrate 100 MG tablet, Take 100 mg by mouth daily., Disp: , Rfl:     Metoprolol-hydroCHLOROthiazide (METOPROLOL-HCTZ ER OR), Take by mouth., Disp: , Rfl:     nystatin 100000 UNIT/GM ointment, Use 1-2 times daily as needed for itching under breasts, Disp: , Rfl:     Omega-3 Fatty Acids (FISH OIL) 1000 MG Oral Capsule, Take 1,000 mg by mouth., Disp: , Rfl:     raNITIdine HCl (ZANTAC OR), , Disp: , Rfl:     Rosuvastatin Calcium (CRESTOR OR), Take 40 mg by mouth daily. , Disp: , Rfl:     traMADol 50 MG tablet, Take 50 mg by mouth every 6 hours as needed., Disp: , Rfl:     Labs/Imaging: reviewed in Epic and PACS.  Blood Pressure Readings     Blood Pressure 01/12/2018 01/12/2018 03/10/2019 03/10/2019    BP 147/72 139/70 150/62 154/77        Assessment/Recommendations:  68 y/o F w/ HTN, OSA, HLDwith chronicL CR and L PLIClacunar infarcts, cSVD (w/ prominent BG dilated PVS), RLE paresis, gait instability.    Diagnoses:  (Z86.73)  History of lacunar cerebrovascular accident  (primary encounter diagnosis)  (I67.9) Cerebrovascular small vessel disease  (R26.81) Gait instability    Recommendations:  -rehab medicine referral Jan 2021 (patient to communicate [place] when ready for referral)  -plavix 75 daily  -amlodipine and metoprolol; continue outpatient BP monitoring; titrate antihypertensive tx for goal BP<130/80  -high-intensity statin therapy w/ crestor 40 qhs; target LDL<70  -CPAP for OSA; f/u sleep medicine  -home PT exercises  -Lifestyle modifications:Na-restrictedmediterranean-type diet, physical activity  -[id number]    Stacey Colla, MD    I spent >40 minutes providing care for this patient, including  reviewing records, conducting a history and physical, explaining recommendations, coordinating care, and completing documentation.

## 2019-04-01 ENCOUNTER — Encounter (HOSPITAL_BASED_OUTPATIENT_CLINIC_OR_DEPARTMENT_OTHER): Payer: Self-pay | Admitting: Vascular Neurology

## 2019-04-04 NOTE — Telephone Encounter (Signed)
[employer] lab work scanned into [employer]

## 2020-04-26 ENCOUNTER — Encounter (HOSPITAL_BASED_OUTPATIENT_CLINIC_OR_DEPARTMENT_OTHER): Payer: Self-pay | Admitting: Vascular Neurology

## 2020-04-26 ENCOUNTER — Ambulatory Visit: Payer: Medicare Other | Attending: Vascular Neurology | Admitting: Vascular Neurology

## 2020-04-26 VITALS — BP 121/63 | HR 61 | Temp 98.0°F | Ht 63.0 in | Wt 162.0 lb

## 2020-04-26 DIAGNOSIS — Z8673 Personal history of transient ischemic attack (TIA), and cerebral infarction without residual deficits: Secondary | ICD-10-CM

## 2020-04-26 DIAGNOSIS — I679 Cerebrovascular disease, unspecified: Secondary | ICD-10-CM | POA: Insufficient documentation

## 2020-04-26 NOTE — Progress Notes (Addendum)
[facility] STROKE CLINIC  FOLLOW-UP VISIT    CC/ID:   69 y/o F w/ HTN, OSA on CPAP, HLDwith chronicL corona radiatia and L posterior limb of internal capsulelacunar infarcts, cSVD (w/ prominent BG dilated PVS), RLE paresis, gait instability.    INTERVAL HISTORY:  She was last seen by Dr. Lars Masson on 03/10/2019.  At that time, she was doing well after undergoing back surgery earlier that year.  Plan was for a rehab medicine referral in January 2021 when patient communicated she was ready due to her concerns her recovery had plateaued.  This did not occur due to COVID concerns.      Since her last visit, she has been doing well without symptom recurrence.  She continues to have difficulty with gait and continues to have worries that her recovery has plateaued.  She uses a cane for ambulation.  She continues to due exercises on her own but is not currently working with therapies.  Outpatient BP monitoring demonstrates readings of <130/80. She endorses medication and CPAP compliance and denies medication adverse effects.    Exam:  BP 121/63    Pulse 61    Temp 36.7 C (Temporal)    Ht 5\' 3"  (1.6 m)    Wt 73.5 kg (162 lb)    SpO2 96%    BMI 28.70 kg/m     GENERAL:  Alert, oriented. General appearance is normal.   NEUROLOGIC:  Orientation, memory, attention, fund of knowledge, and language are all normal for office conversation.  Pupils equal, round, and reactive to light.  Extraocular movements are intact.  No nystagmus is seen.  Funduscopic examination shows benign sharp discs.  Face is normal to motor and sensory exam.  Oropharynx benign with tongue and palate midline.  Neck strength and sternocleidomastoid normal.  Hearing is grossly normal.  Strength in the extremities is 5/5 throughout except RLE HF/KF/ADF 4.  Sensory exam intact to LT in all extremities.  Finger-to-nose and heel-to-shin are normal.  Gait is wide-based, cautious, and hemiparetic.    Current Outpatient Medications:     AMLODIPINE BESYLATE OR, Take 5 mg  by mouth daily. , Disp: , Rfl:     CALCIUM CITRATE OR, Take by mouth., Disp: , Rfl:     Cholecalciferol 1000 UNIT/10ML Oral Liquid, Take by mouth., Disp: , Rfl:     clopidogrel 75 MG Oral Tablet, Take 75 mg by mouth daily. , Disp: , Rfl:     Cyanocobalamin (VITAMIN B12) 1000 MCG Oral Tab CR, Take by mouth., Disp: , Rfl:     denosumab 60 MG/ML Subcutaneous Solution Prefilled Syringe, Inject 60 mg under the skin., Disp: , Rfl:     famotidine 20 MG tablet, Take by mouth., Disp: , Rfl:     methocarbamol 500 MG tablet, Take 500-1,000 mg by mouth 3 times a day as needed., Disp: , Rfl:     metoprolol tartrate 100 MG tablet, Take 100 mg by mouth daily., Disp: , Rfl:     Metoprolol-hydroCHLOROthiazide (METOPROLOL-HCTZ ER OR), Take by mouth. (Patient not taking: Reported on 04/26/2020), Disp: , Rfl:     Multiple Vitamins-Minerals (CENTRUM SILVER OR), Take by mouth., Disp: , Rfl:     nystatin 100000 UNIT/GM ointment, Use 1-2 times daily as needed for itching under breasts, Disp: , Rfl:     Omega-3 Fatty Acids (FISH OIL) 1000 MG Oral Capsule, Take 1,000 mg by mouth., Disp: , Rfl:     raNITIdine HCl (ZANTAC OR), , Disp: , Rfl:  Rosuvastatin Calcium (CRESTOR OR), Take 40 mg by mouth daily. , Disp: , Rfl:     traMADol 50 MG tablet, Take 50 mg by mouth every 6 hours as needed. (Patient not taking: Reported on 04/26/2020), Disp: , Rfl:     Labs/Imaging: reviewed in Epic and PACS.  Blood Pressure Readings     Blood Pressure 01/12/2018 01/12/2018 03/10/2019 03/10/2019 04/26/2020    BP 147/72 139/70 150/62 154/77 121/63        Assessment/Recommendations:  69 year old woman with past medical history significant for HTN, OSA on CPAP, HLDwith chronicleft corona radiatia and left posterior limb of internal capsulelacunar infarcts, cSVD (w/ prominent BG dilated PVS), RLE paresis, gait instability.  Her examination today is stable from prior.  She has had no recurrent symptoms and her stroke risk factors are optimized.   She  is interested in progressing her recovery and we will place a rehab medicine referral and referral for PT gait and balance training today.    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

## 2020-05-08 NOTE — Patient Instructions (Signed)
You were seen by Dr. Lars Masson (attending) and Dr. Theodora Blow (fellow) at the [facility] stroke clinic.    We have placed referrals to rehab medicine and physical therapy for gait and balance.    We are focusing on making sure that your stroke risk factors are well controlled. These generally include high blood pressure, diabetes, high cholesterol, sleep apnea, and smoking.  To help lower your risk of having another stroke we would like you to do the following:    1) Measure your BP at home daily, log these values, and bring to your primary care provider. Our goal is that your blood pressure is always lower than 130/80. If you are frequently higher than our goal, your primary care provider may need to adjust your medications.   2) Eat a heart healthy, low sodium diet -- the Mediterranean diet has the most evidence for prevention of cardiovascular disease.  3) Regular exercise -- the WHO recommends at least 150 minutes of brisk walking per week.  4) Continue plavix and statin.  5) Continue using CPAP treatment for obstructive sleep apnea.    If you experience recurrent stroke symptoms, call 911 immediately!    For stroke symtoms, think BEFAST   B - Balance problems  E - Eyes (vision loss)  F - Facial weakness or droop  A - Arm weakness  S - Speech/language  T - Time is brain!    Thank you for entrusting [place] with your care. It is an honor to be your physician.

## 2020-06-19 ENCOUNTER — Telehealth (HOSPITAL_BASED_OUTPATIENT_CLINIC_OR_DEPARTMENT_OTHER): Payer: Medicare Other | Admitting: Physical Medicine & Rehabilitation

## 2020-06-25 NOTE — Progress Notes (Addendum)
[facility]    06/26/2020    REFERRING PROVIDER:  Audie Box, MD    REFERRAL REQUEST:   Ms. Stacey Schwartz is a 70 year old female who is referred for continued functional recovery after left corona radiata and left posterior limb of internal capsule lacunar infarcts.    CHIEF COMPLAINT: functional recovery after stroke    HPI:   70 year old woman with past medical history significant for HTN, OSA on CPAP, fibromyalgia, HLDwith chronicleft corona radiatia and left posterior limb of internal capsulelacunar infarcts who presents to rehabilitation medicine today for continued functional recovery after stroke. She was seen and referred by neurology on 04/26/2020, as well as to PT for gait and balance.    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   Osteoporosis with compression fractures (prolia injections)  Recurrent falls due to poor balance    PAST MEDICAL HISTORY, SOCIAL HISTORY, FAMILY HISTORY: Documented in EPIC.  Reviewed and updated as appropriate.   Kyphoplasty T12, L1, L5, 05/27/2018 (Dr. Autumn Messing)    ROS: Reviewed and positive for: See HPI.  All other systems are reviewed with the patient and negative today.     PHYSICAL EXAM:  VS:  BP (!) 157/80    Pulse (!) 58    Temp 36.3 C (Temporal)    Resp 16    Wt 73.9 kg (163 lb)     SpO2 100%    BMI 28.87 kg/m   Pain: 0 (No Pain) No pain information filed.   General: pleasant, appropriate, alert  Neuro: AOx3, fluent speech and follows commands, strength in legs: RLE 4/5 in hip abductors and flexors, LLE 5/5 strength, no rigidity, normal muscle tone, no tremor  Gait - poor balance, limited right hip flexion, poor one leg standing tolerance    RESULTS REVIEW:  Imaging:  No brain imaging in system    ASSESSMENT: 70 year old female recovering from chronicleft corona radiatia and left posterior limb of internal capsulelacunar infarcts with poor balance and right leg weakness, particularly leg abductors that keep the hips level in single stance.    PLAN:   -continue home PT exercise program: add daily hip abductor strengthening and single leg standing next to wall to improve balance  -discussed that there is no medication to improve her gait  -continue to use single cane and use fall prevention strategies  -follow-up as needed    50 minutes time spent in total time for this patient including face-to-face care, pre-visit preparation work, post-visit work, orders, and documentation on the day of the encounter.

## 2020-06-26 ENCOUNTER — Ambulatory Visit: Payer: Medicare Other | Attending: Physical Medicine & Rehabilitation | Admitting: Physical Medicine & Rehabilitation

## 2020-06-26 DIAGNOSIS — I679 Cerebrovascular disease, unspecified: Secondary | ICD-10-CM | POA: Insufficient documentation

## 2020-06-26 DIAGNOSIS — Z8673 Personal history of transient ischemic attack (TIA), and cerebral infarction without residual deficits: Secondary | ICD-10-CM

## 2020-09-06 ENCOUNTER — Encounter (HOSPITAL_BASED_OUTPATIENT_CLINIC_OR_DEPARTMENT_OTHER): Payer: Self-pay | Admitting: Vascular Neurology

## 7404-08-10 DEATH — deceased
